# Patient Record
Sex: Female | Born: 1975 | Marital: Single | Smoking: Former smoker
Health system: Southern US, Community
[De-identification: ages and names within clinical notes are randomized; demographics above are authoritative.]

## PROBLEM LIST (undated history)

## (undated) DIAGNOSIS — R51 Headache: Secondary | ICD-10-CM

## (undated) DIAGNOSIS — R519 Headache, unspecified: Secondary | ICD-10-CM

## (undated) DIAGNOSIS — E7800 Pure hypercholesterolemia, unspecified: Secondary | ICD-10-CM

## (undated) DIAGNOSIS — F329 Major depressive disorder, single episode, unspecified: Secondary | ICD-10-CM

## (undated) DIAGNOSIS — F32A Depression, unspecified: Secondary | ICD-10-CM

## (undated) DIAGNOSIS — D649 Anemia, unspecified: Secondary | ICD-10-CM

## (undated) DIAGNOSIS — F419 Anxiety disorder, unspecified: Secondary | ICD-10-CM

## (undated) DIAGNOSIS — Z87440 Personal history of urinary (tract) infections: Secondary | ICD-10-CM

## (undated) DIAGNOSIS — R011 Cardiac murmur, unspecified: Secondary | ICD-10-CM

## (undated) DIAGNOSIS — I10 Essential (primary) hypertension: Secondary | ICD-10-CM

## (undated) DIAGNOSIS — E039 Hypothyroidism, unspecified: Secondary | ICD-10-CM

## (undated) HISTORY — DX: Headache: R51

## (undated) HISTORY — DX: Essential (primary) hypertension: I10

## (undated) HISTORY — DX: Anxiety disorder, unspecified: F41.9

## (undated) HISTORY — DX: Headache, unspecified: R51.9

## (undated) HISTORY — DX: Depression, unspecified: F32.A

## (undated) HISTORY — DX: Pure hypercholesterolemia, unspecified: E78.00

## (undated) HISTORY — DX: Hypothyroidism, unspecified: E03.9

## (undated) HISTORY — PX: COLONOSCOPY: SHX174

## (undated) HISTORY — DX: Cardiac murmur, unspecified: R01.1

## (undated) HISTORY — DX: Major depressive disorder, single episode, unspecified: F32.9

## (undated) HISTORY — PX: WISDOM TOOTH EXTRACTION: SHX21

## (undated) HISTORY — DX: Personal history of urinary (tract) infections: Z87.440

---

## 1997-10-07 HISTORY — PX: TUBAL LIGATION: SHX77

## 2010-10-07 HISTORY — PX: CHOLECYSTECTOMY: SHX55

## 2013-05-03 ENCOUNTER — Encounter: Payer: Self-pay | Admitting: Internal Medicine

## 2013-05-03 ENCOUNTER — Ambulatory Visit (INDEPENDENT_AMBULATORY_CARE_PROVIDER_SITE_OTHER): Payer: Managed Care, Other (non HMO) | Admitting: Internal Medicine

## 2013-05-03 VITALS — BP 130/90 | HR 67 | Temp 99.0°F | Ht 67.75 in | Wt 260.2 lb

## 2013-05-03 DIAGNOSIS — E039 Hypothyroidism, unspecified: Secondary | ICD-10-CM

## 2013-05-03 DIAGNOSIS — I10 Essential (primary) hypertension: Secondary | ICD-10-CM

## 2013-05-03 DIAGNOSIS — R51 Headache: Secondary | ICD-10-CM

## 2013-05-03 DIAGNOSIS — E7800 Pure hypercholesterolemia, unspecified: Secondary | ICD-10-CM

## 2013-05-03 LAB — COMPREHENSIVE METABOLIC PANEL
AST: 26 U/L (ref 0–37)
Albumin: 3.8 g/dL (ref 3.5–5.2)
BUN: 9 mg/dL (ref 6–23)
Calcium: 8.9 mg/dL (ref 8.4–10.5)
Chloride: 110 mEq/L (ref 96–112)
Glucose, Bld: 92 mg/dL (ref 70–99)
Potassium: 4.3 mEq/L (ref 3.5–5.1)
Sodium: 139 mEq/L (ref 135–145)
Total Protein: 7.2 g/dL (ref 6.0–8.3)

## 2013-05-03 LAB — LIPID PANEL
Cholesterol: 216 mg/dL — ABNORMAL HIGH (ref 0–200)
Total CHOL/HDL Ratio: 5
Triglycerides: 121 mg/dL (ref 0.0–149.0)
VLDL: 24.2 mg/dL (ref 0.0–40.0)

## 2013-05-03 LAB — CBC WITH DIFFERENTIAL/PLATELET
Basophils Relative: 0.5 % (ref 0.0–3.0)
Eosinophils Absolute: 0.1 10*3/uL (ref 0.0–0.7)
Eosinophils Relative: 1.1 % (ref 0.0–5.0)
Lymphocytes Relative: 36.3 % (ref 12.0–46.0)
Neutrophils Relative %: 55.7 % (ref 43.0–77.0)
Platelets: 191 10*3/uL (ref 150.0–400.0)
RBC: 4.65 Mil/uL (ref 3.87–5.11)
WBC: 6.5 10*3/uL (ref 4.5–10.5)

## 2013-05-03 MED ORDER — LISINOPRIL-HYDROCHLOROTHIAZIDE 20-25 MG PO TABS: 1.0000 | ORAL_TABLET | Freq: Every day | ORAL | Status: DC

## 2013-05-03 MED ORDER — LEVOTHYROXINE SODIUM 175 MCG PO TABS: 175.0000 ug | ORAL_TABLET | Freq: Every day | ORAL | Status: DC

## 2013-05-03 MED ORDER — METOPROLOL SUCCINATE ER 50 MG PO TB24: 50.0000 mg | ORAL_TABLET | Freq: Every day | ORAL | Status: DC

## 2013-05-05 ENCOUNTER — Encounter: Payer: Self-pay | Admitting: Internal Medicine

## 2013-05-05 DIAGNOSIS — E039 Hypothyroidism, unspecified: Secondary | ICD-10-CM | POA: Insufficient documentation

## 2013-05-05 DIAGNOSIS — E7800 Pure hypercholesterolemia, unspecified: Secondary | ICD-10-CM | POA: Insufficient documentation

## 2013-05-05 DIAGNOSIS — R51 Headache: Secondary | ICD-10-CM | POA: Insufficient documentation

## 2013-05-05 DIAGNOSIS — R519 Headache, unspecified: Secondary | ICD-10-CM | POA: Insufficient documentation

## 2013-05-05 DIAGNOSIS — I10 Essential (primary) hypertension: Secondary | ICD-10-CM | POA: Insufficient documentation

## 2013-05-05 NOTE — Assessment & Plan Note (Signed)
Blood pressure normally controlled per her report.  Off her medication now.  Restart.  Follow.  Check metabolic panel.

## 2013-05-05 NOTE — Progress Notes (Signed)
  Subjective:    Patient ID: [name] Nielsen, female    DOB: 01/25/1976, 37 y.o.   MRN: [id number]  HPI 37 year old female with past history of hypertension, hypercholesterolemia and hypothyroidism who comes in today to follow up on these issues as well as to establish care.  She recently moved to [place] (from [place]) - for a job transfer.  She has been out of her medications for one month.  Feels more fatigue.  Some "puffiness".  She has a history of headaches - 1-2x/month.   Started as a teenager.  May last one day.  No auras.  Takes excedrin.  Relieves.  Able to work.  No allergy or sinus symptoms.  Was diagnosed with hypertension three years ago.  Blood pressure usually averages 115/60s.  On no medication for cholesterol.  LMP - beginning of July.  Intermittently heavy.  No abdominal pain or cramping.  Bowels stable.  Previous history of depression.  Occurred after her mother passed away.  Was on zoloft for a while.  Tapered off and has done well.     Past Medical History  Diagnosis Date  . Depression   . Hypertension   . Hypercholesterolemia   . Frequent headaches     hx of  . Heart murmur   . Hypothyroidism   . Hx: UTI (urinary tract infection)     child, s/p urinary procecure    Review of Systems Patient denies any headache currently.  Intermittent headaches as outlined.  No lightheadedness or dizziness.  No sinus or allergy symptoms.  No chest pain, tightness or palpitations.  No increased shortness of breath, cough or congestion.  No nausea or vomiting.  No acid reflux.  No abdominal pain or cramping.  No bowel change, such as diarrhea, constipation, BRBPR or melana.  No urine change.       Objective:   Physical Exam Filed Vitals:   05/03/13 1008  BP: 130/90  Pulse: 67  Temp: 99 F (37.2 C)   Blood pressure recheck:  132/88, pulse 68  37 year old female in no acute distress.   HEENT:  Nares- clear.  Oropharynx - without lesions. NECK:  Supple.  Nontender.  No audible  bruit.  HEART:  Appears to be regular. LUNGS:  No crackles or wheezing audible.  Respirations even and unlabored.  RADIAL PULSE:  Equal bilaterally.  ABDOMEN:  Soft, nontender.  Bowel sounds present and normal.  No audible abdominal bruit.  EXTREMITIES:  No increased edema present.  DP pulses palpable and equal bilaterally.      Assessment & Plan:  DEPRESSION.  Occurred when her mother passed away.  Off zoloft now.  Follow.   HEALTH MAINTENANCE.  Schedule her for a physical.  Obtain outside records.

## 2013-05-05 NOTE — Assessment & Plan Note (Signed)
Off her thyroid medication.  Restart.  Check tsh.

## 2013-05-05 NOTE — Assessment & Plan Note (Signed)
Low cholesterol diet and exercise.  Check lipid panel.   

## 2013-05-05 NOTE — Assessment & Plan Note (Signed)
Intermittent headaches.  Started as a teenager.  Stable.

## 2013-05-07 ENCOUNTER — Encounter: Payer: Self-pay | Admitting: Internal Medicine

## 2013-05-07 ENCOUNTER — Telehealth: Payer: Self-pay | Admitting: Internal Medicine

## 2013-05-07 DIAGNOSIS — D649 Anemia, unspecified: Secondary | ICD-10-CM

## 2013-05-07 NOTE — Telephone Encounter (Signed)
Pt notified of lab results and the need for f/u lab.  Please schedule her for a f/u non fasting lab appt within the next 2 weeks.  Thanks.

## 2013-05-07 NOTE — Telephone Encounter (Signed)
Appointment 8/15  Sent pt my chart message letting her know about her appointment

## 2013-05-21 ENCOUNTER — Other Ambulatory Visit (INDEPENDENT_AMBULATORY_CARE_PROVIDER_SITE_OTHER): Payer: Managed Care, Other (non HMO)

## 2013-05-21 DIAGNOSIS — D649 Anemia, unspecified: Secondary | ICD-10-CM

## 2013-05-21 LAB — CBC WITH DIFFERENTIAL/PLATELET
Basophils Absolute: 0 10*3/uL (ref 0.0–0.1)
Eosinophils Absolute: 0.1 10*3/uL (ref 0.0–0.7)
Lymphocytes Relative: 35.2 % (ref 12.0–46.0)
MCHC: 33.5 g/dL (ref 30.0–36.0)
Neutrophils Relative %: 55.1 % (ref 43.0–77.0)
RBC: 4.82 Mil/uL (ref 3.87–5.11)
RDW: 14.1 % (ref 11.5–14.6)

## 2013-05-21 LAB — IBC PANEL
Iron: 48 ug/dL (ref 42–145)
Transferrin: 251.7 mg/dL (ref 212.0–360.0)

## 2013-05-22 ENCOUNTER — Encounter: Payer: Self-pay | Admitting: Internal Medicine

## 2013-07-05 ENCOUNTER — Ambulatory Visit (INDEPENDENT_AMBULATORY_CARE_PROVIDER_SITE_OTHER): Payer: Managed Care, Other (non HMO) | Admitting: Internal Medicine

## 2013-07-05 ENCOUNTER — Other Ambulatory Visit (HOSPITAL_COMMUNITY)
Admission: RE | Admit: 2013-07-05 | Discharge: 2013-07-05 | Disposition: A | Discharge status: Home / Self Care | Payer: Managed Care, Other (non HMO) | Source: Ambulatory Visit | Attending: Internal Medicine | Admitting: Internal Medicine

## 2013-07-05 ENCOUNTER — Encounter: Payer: Self-pay | Admitting: Internal Medicine

## 2013-07-05 VITALS — BP 120/80 | HR 69 | Temp 98.4°F | Ht 67.0 in | Wt 253.2 lb

## 2013-07-05 DIAGNOSIS — R51 Headache: Secondary | ICD-10-CM

## 2013-07-05 DIAGNOSIS — I10 Essential (primary) hypertension: Secondary | ICD-10-CM

## 2013-07-05 DIAGNOSIS — E039 Hypothyroidism, unspecified: Secondary | ICD-10-CM

## 2013-07-05 DIAGNOSIS — D649 Anemia, unspecified: Secondary | ICD-10-CM | POA: Insufficient documentation

## 2013-07-05 DIAGNOSIS — Z01419 Encounter for gynecological examination (general) (routine) without abnormal findings: Secondary | ICD-10-CM | POA: Insufficient documentation

## 2013-07-05 DIAGNOSIS — Z124 Encounter for screening for malignant neoplasm of cervix: Secondary | ICD-10-CM

## 2013-07-05 DIAGNOSIS — E7800 Pure hypercholesterolemia, unspecified: Secondary | ICD-10-CM

## 2013-07-05 DIAGNOSIS — Z1151 Encounter for screening for human papillomavirus (HPV): Secondary | ICD-10-CM | POA: Insufficient documentation

## 2013-07-05 LAB — CBC WITH DIFFERENTIAL/PLATELET
Basophils Absolute: 0.1 10*3/uL (ref 0.0–0.1)
Eosinophils Absolute: 0.1 10*3/uL (ref 0.0–0.7)
Lymphocytes Relative: 32.7 % (ref 12.0–46.0)
MCHC: 32.8 g/dL (ref 30.0–36.0)
Neutrophils Relative %: 58.6 % (ref 43.0–77.0)
RDW: 14.7 % — ABNORMAL HIGH (ref 11.5–14.6)

## 2013-07-05 LAB — TSH: TSH: 2.13 u[IU]/mL (ref 0.35–5.50)

## 2013-07-05 NOTE — Assessment & Plan Note (Signed)
Intermittent headaches.  Started as a teenager.  Better.  Follow.   

## 2013-07-05 NOTE — Assessment & Plan Note (Signed)
Blood pressure doing well.  Follow metabolic panel.  

## 2013-07-05 NOTE — Progress Notes (Signed)
  Subjective:    Patient ID: [name] Nielsen, female    DOB: Feb 10, 1976, 37 y.o.   MRN: [id number]  HPI 37 year old female with past history of hypertension, hypercholesterolemia and hypothyroidism who comes in today to follow up on these issues as well as for a complete physical exam.   Headaches better.  No allergy or sinus symptoms.  Was diagnosed with hypertension three years ago.  On medication and controlled.  No abdominal pain or cramping.  Bowels stable.  LMP end of last month.  Has had a tubal ligation.     Past Medical History  Diagnosis Date  . Depression   . Hypertension   . Hypercholesterolemia   . Frequent headaches     hx of  . Heart murmur   . Hypothyroidism   . Hx: UTI (urinary tract infection)     child, s/p urinary procecure    Current Outpatient Prescriptions on File Prior to Visit  Medication Sig Dispense Refill  . levothyroxine (SYNTHROID, LEVOTHROID) 175 MCG tablet Take 1 tablet (175 mcg total) by mouth daily before breakfast.  30 tablet  1  . lisinopril-hydrochlorothiazide (PRINZIDE,ZESTORETIC) 20-25 MG per tablet Take 1 tablet by mouth daily.  30 tablet  1  . metoprolol succinate (TOPROL-XL) 50 MG 24 hr tablet Take 1 tablet (50 mg total) by mouth daily. Take with or immediately following a meal.  30 tablet  1   No current facility-administered medications on file prior to visit.    Review of Systems Headaches have improved.   No lightheadedness or dizziness.  No sinus or allergy symptoms.  No chest pain, tightness or palpitations.  No increased shortness of breath, cough or congestion.  No nausea or vomiting.  No acid reflux.  No abdominal pain or cramping.  No bowel change, such as diarrhea, constipation, BRBPR or melana.  No urine change.       Objective:   Physical Exam  Filed Vitals:   07/05/13 0935  BP: 120/80  Pulse: 69  Temp: 98.4 F (36.9 C)   Blood pressure recheck:  114/76, pulse 38  37 year old female in no acute distress.   HEENT:   Nares- clear.  Oropharynx - without lesions. NECK:  Supple.  Nontender.  No audible bruit.  HEART:  Appears to be regular. LUNGS:  No crackles or wheezing audible.  Respirations even and unlabored.  RADIAL PULSE:  Equal bilaterally.    BREASTS:  No nipple discharge or nipple retraction present.  Could not appreciate any distinct nodules or axillary adenopathy.  ABDOMEN:  Soft, nontender.  Bowel sounds present and normal.  No audible abdominal bruit.  GU:  Normal external genitalia.  Vaginal vault without lesions.  Cervix identified.  Pap performed. Could not appreciate any adnexal masses or tenderness.   RECTAL:  Not performed.   EXTREMITIES:  No increased edema present.  DP pulses palpable and equal bilaterally.          Assessment & Plan:  DEPRESSION.  Occurred when her mother passed away.  Off zoloft now.  Doing well.   HEALTH MAINTENANCE.  Physical today.  Gave her information to schedule her mammogram.  Had colonoscopy a couple of years ago.

## 2013-07-05 NOTE — Assessment & Plan Note (Signed)
Low cholesterol diet and exercise.  Follow lipid panel.   

## 2013-07-05 NOTE — Assessment & Plan Note (Signed)
Not taking iron.  Recheck cbc and ferritin.

## 2013-07-05 NOTE — Assessment & Plan Note (Signed)
On thyroid medication.   Check tsh.

## 2013-07-07 ENCOUNTER — Other Ambulatory Visit: Payer: Self-pay | Admitting: *Deleted

## 2013-07-07 ENCOUNTER — Encounter: Payer: Self-pay | Admitting: Internal Medicine

## 2013-07-07 MED ORDER — LEVOTHYROXINE SODIUM 175 MCG PO TABS: 175.0000 ug | ORAL_TABLET | Freq: Every day | ORAL | Status: DC

## 2013-07-07 MED ORDER — LISINOPRIL-HYDROCHLOROTHIAZIDE 20-25 MG PO TABS: 1.0000 | ORAL_TABLET | Freq: Every day | ORAL | Status: DC

## 2013-07-07 MED ORDER — METOPROLOL SUCCINATE ER 50 MG PO TB24: 50.0000 mg | ORAL_TABLET | Freq: Every day | ORAL | Status: DC

## 2013-07-08 ENCOUNTER — Telehealth: Payer: Self-pay | Admitting: Internal Medicine

## 2013-07-08 ENCOUNTER — Encounter: Payer: Self-pay | Admitting: Internal Medicine

## 2013-07-08 DIAGNOSIS — D649 Anemia, unspecified: Secondary | ICD-10-CM

## 2013-07-08 MED ORDER — INTEGRA 62.5-62.5-40-3 MG PO CAPS: ORAL_CAPSULE | ORAL | Status: DC

## 2013-07-08 NOTE — Telephone Encounter (Signed)
Pt notified of lab results via my chart. Needs a f/u lab in 6 weeks.  Please schedule her for a non fasting lab in 6 weeks and contact her with an appt date and time.

## 2013-07-09 NOTE — Telephone Encounter (Signed)
Sent my chart message letting pt know about non fasting lab appointment  Also ask pt to confirm phone number

## 2013-07-12 ENCOUNTER — Ambulatory Visit: Payer: Self-pay | Admitting: Internal Medicine

## 2013-07-24 ENCOUNTER — Encounter: Payer: Self-pay | Admitting: Internal Medicine

## 2013-08-12 ENCOUNTER — Other Ambulatory Visit: Payer: Self-pay

## 2013-08-13 ENCOUNTER — Encounter: Payer: Self-pay | Admitting: Internal Medicine

## 2013-08-20 ENCOUNTER — Other Ambulatory Visit (INDEPENDENT_AMBULATORY_CARE_PROVIDER_SITE_OTHER): Payer: Managed Care, Other (non HMO)

## 2013-08-20 DIAGNOSIS — D649 Anemia, unspecified: Secondary | ICD-10-CM

## 2013-08-20 LAB — CBC WITH DIFFERENTIAL/PLATELET
Basophils Absolute: 0 10*3/uL (ref 0.0–0.1)
Basophils Relative: 0.4 % (ref 0.0–3.0)
Eosinophils Relative: 1.8 % (ref 0.0–5.0)
HCT: 35.1 % — ABNORMAL LOW (ref 36.0–46.0)
Hemoglobin: 11.7 g/dL — ABNORMAL LOW (ref 12.0–15.0)
Lymphocytes Relative: 41.7 % (ref 12.0–46.0)
Lymphs Abs: 2.6 10*3/uL (ref 0.7–4.0)
MCV: 73.8 fl — ABNORMAL LOW (ref 78.0–100.0)
Monocytes Absolute: 0.4 10*3/uL (ref 0.1–1.0)
Monocytes Relative: 6.2 % (ref 3.0–12.0)
Neutro Abs: 3.1 10*3/uL (ref 1.4–7.7)
RBC: 4.76 Mil/uL (ref 3.87–5.11)
RDW: 14.4 % (ref 11.5–14.6)
WBC: 6.3 10*3/uL (ref 4.5–10.5)

## 2013-08-23 ENCOUNTER — Encounter: Payer: Self-pay | Admitting: Internal Medicine

## 2013-08-23 ENCOUNTER — Telehealth: Payer: Self-pay | Admitting: Internal Medicine

## 2013-08-23 DIAGNOSIS — D649 Anemia, unspecified: Secondary | ICD-10-CM

## 2013-08-23 NOTE — Telephone Encounter (Signed)
Pt notified of lab results via my chart.  Needs a f/u non fasting lab in 3 months.  She also needs a f/u appt with me in 6 months (end of 3/15).  Please schedule and contact pt with appt date and time.  Thanks.

## 2013-08-25 NOTE — Telephone Encounter (Signed)
Lab appointment 2/23 Dr Lorin Picket 5/20 Pt aware of appointment

## 2013-08-25 NOTE — Telephone Encounter (Signed)
Left message for pt to call office

## 2013-09-09 ENCOUNTER — Telehealth: Payer: Self-pay | Admitting: Internal Medicine

## 2013-09-09 ENCOUNTER — Encounter: Payer: Self-pay | Admitting: Internal Medicine

## 2013-09-09 NOTE — Telephone Encounter (Signed)
See below

## 2013-09-09 NOTE — Telephone Encounter (Signed)
Pt had form sent over by mail from [facility] to be signed. I put them in your inbox up front.

## 2013-09-10 ENCOUNTER — Encounter: Payer: Self-pay | Admitting: *Deleted

## 2013-09-10 NOTE — Telephone Encounter (Signed)
I can set her up for monthly appts for weight program.  Will need to schedule an initial appt and then one appt q month for 3 more months.  (will need a total of 4 appts).  appts will need to be appts.  Thanks.  Form placed in your box to hold until appt.

## 2013-09-15 ENCOUNTER — Encounter: Payer: Self-pay | Admitting: *Deleted

## 2013-09-15 NOTE — Telephone Encounter (Signed)
Pt home number can not receive incoming calls- mailed letter

## 2013-09-16 NOTE — Telephone Encounter (Signed)
Dr Lorin Picket please advise date and time

## 2013-09-16 NOTE — Telephone Encounter (Signed)
Can put her in on 09/27/13 - 2:00

## 2013-09-16 NOTE — Telephone Encounter (Signed)
(  Robin-please call patient with a 30 minute appointment) Pt sent mychart message this morning. I received a letter in the mail regarding the below appointment. I tried calling the office, but I cannot get an answer. Please call my work number [phone] or my cell [phone] to set up the appiontment. Or you can just choose a date and time for me. Either way works. The only date this month that I cannot make an appointment is 12/12 and 12/18. Any other date should be fine.

## 2013-09-16 NOTE — Telephone Encounter (Signed)
Sent my chart message about appointment date and time

## 2013-09-27 ENCOUNTER — Other Ambulatory Visit: Payer: Self-pay | Admitting: Internal Medicine

## 2013-09-27 ENCOUNTER — Ambulatory Visit (INDEPENDENT_AMBULATORY_CARE_PROVIDER_SITE_OTHER): Payer: Managed Care, Other (non HMO) | Admitting: Internal Medicine

## 2013-09-27 ENCOUNTER — Encounter: Payer: Self-pay | Admitting: Internal Medicine

## 2013-09-27 VITALS — BP 130/80 | HR 76 | Temp 98.5°F | Ht 67.0 in | Wt 263.8 lb

## 2013-09-27 DIAGNOSIS — E039 Hypothyroidism, unspecified: Secondary | ICD-10-CM

## 2013-09-27 DIAGNOSIS — E7800 Pure hypercholesterolemia, unspecified: Secondary | ICD-10-CM

## 2013-09-27 DIAGNOSIS — D649 Anemia, unspecified: Secondary | ICD-10-CM

## 2013-09-27 DIAGNOSIS — I10 Essential (primary) hypertension: Secondary | ICD-10-CM

## 2013-09-27 NOTE — Progress Notes (Signed)
Orders placed for labs

## 2013-09-27 NOTE — Progress Notes (Signed)
  Subjective:    Patient ID: [name] Nielsen, female    DOB: [birth year], 37 y.o.   MRN: [id number]  HPI 37 year old female with past history of hypertension, hypercholesterolemia and hypothyroidism who comes in today to discuss plans for gastric sleeve surgery.  She states she has had problems with her weight for years.  Has tried weight watchers and ephedrine.  Has tried exercising/running.  Has not been exercising regularly recently.  Not watching her diet as well.  No acid reflux.  No nausea or vomiting.  No bowel change.     Past Medical History  Diagnosis Date  . Depression   . Hypertension   . Hypercholesterolemia   . Frequent headaches     hx of  . Heart murmur   . Hypothyroidism   . Hx: UTI (urinary tract infection)     child, s/p urinary procecure    Current Outpatient Prescriptions on File Prior to Visit  Medication Sig Dispense Refill  . Fe Fum-FePoly-Vit C-Vit B3 (INTEGRA) 62.5-62.5-40-3 MG CAPS Take one capsule 3 days per week.  30 capsule  2  . levothyroxine (SYNTHROID, LEVOTHROID) 175 MCG tablet Take 1 tablet (175 mcg total) by mouth daily before breakfast.  30 tablet  11  . lisinopril-hydrochlorothiazide (PRINZIDE,ZESTORETIC) 20-25 MG per tablet Take 1 tablet by mouth daily.  30 tablet  5  . metoprolol succinate (TOPROL-XL) 50 MG 24 hr tablet Take 1 tablet (50 mg total) by mouth daily. Take with or immediately following a meal.  30 tablet  5   No current facility-administered medications on file prior to visit.    Review of Systems Headaches have improved.   No lightheadedness or dizziness.  No sinus or allergy symptoms.  No chest pain, tightness or palpitations.  No increased shortness of breath, cough or congestion.  No nausea or vomiting.  No acid reflux.  No abdominal pain or cramping.  No bowel change, such as diarrhea, constipation, BRBPR or melana.  No urine change.  Has tried various methods to lose weight.  Will lose some and then gain weight back.  Has not been  exercising regularly lately.  Plans to start.       Objective:   Physical Exam  Filed Vitals:   09/27/13 1400  BP: 130/80  Pulse: 76  Temp: 98.5 F (25.73 C)   37 year old female in no acute distress.   HEENT:  Nares- clear.  Oropharynx - without lesions. NECK:  Supple.  Nontender.  No audible bruit.  HEART:  Appears to be regular. LUNGS:  No crackles or wheezing audible.  Respirations even and unlabored.  RADIAL PULSE:  Equal bilaterally.   ABDOMEN:  Soft, nontender.  Bowel sounds present and normal.  No audible abdominal bruit.   EXTREMITIES:  No increased edema present.  DP pulses palpable and equal bilaterally.          Assessment & Plan:  DESIRE FOR WEIGHT LOSS.  Planning for gastric sleeve.  Has a history of hypertension and hypercholesterolemia.  She has tried various weight loss methods.  Has tried ephedrine and weight watchers.  Not exercising regularly.  Plans to start.  Planning to give up sodas and drink more water.  Plans to start walking.  Will monitor her diet better.  Recheck weight in one month.  She is planning for gastric sleeve surgery.    HEALTH MAINTENANCE.  Physical 07/05/13.   Mammogram 07/22/13 negative.   Had colonoscopy a couple of years ago.

## 2013-09-27 NOTE — Progress Notes (Signed)
Pre-visit discussion using our clinic review tool. No additional management support is needed unless otherwise documented below in the visit note.  

## 2013-10-01 ENCOUNTER — Encounter: Payer: Self-pay | Admitting: Internal Medicine

## 2013-10-01 NOTE — Assessment & Plan Note (Signed)
Low cholesterol diet and exercise.  Follow lipid panel.   

## 2013-10-01 NOTE — Assessment & Plan Note (Signed)
Blood pressure doing well.  Follow metabolic panel.  

## 2013-10-01 NOTE — Assessment & Plan Note (Signed)
On thyroid medication.  Follow tsh.  

## 2013-10-06 ENCOUNTER — Other Ambulatory Visit (INDEPENDENT_AMBULATORY_CARE_PROVIDER_SITE_OTHER): Payer: Self-pay | Admitting: Surgery

## 2013-10-06 ENCOUNTER — Ambulatory Visit (INDEPENDENT_AMBULATORY_CARE_PROVIDER_SITE_OTHER): Payer: Commercial Indemnity | Admitting: Surgery

## 2013-10-06 ENCOUNTER — Encounter (INDEPENDENT_AMBULATORY_CARE_PROVIDER_SITE_OTHER): Payer: Self-pay | Admitting: Surgery

## 2013-10-06 DIAGNOSIS — Z6831 Body mass index (BMI) 31.0-31.9, adult: Secondary | ICD-10-CM | POA: Insufficient documentation

## 2013-10-06 DIAGNOSIS — D649 Anemia, unspecified: Secondary | ICD-10-CM

## 2013-10-06 DIAGNOSIS — I10 Essential (primary) hypertension: Secondary | ICD-10-CM

## 2013-10-06 DIAGNOSIS — Z6833 Body mass index (BMI) 33.0-33.9, adult: Secondary | ICD-10-CM | POA: Insufficient documentation

## 2013-10-06 DIAGNOSIS — Z6841 Body Mass Index (BMI) 40.0 and over, adult: Secondary | ICD-10-CM

## 2013-10-06 LAB — COMPREHENSIVE METABOLIC PANEL
ALT: 27 U/L (ref 0–35)
AST: 21 U/L (ref 0–37)
Albumin: 4.2 g/dL (ref 3.5–5.2)
Alkaline Phosphatase: 44 U/L (ref 39–117)
BUN: 12 mg/dL (ref 6–23)
Chloride: 99 mEq/L (ref 96–112)
Potassium: 3.4 mEq/L — ABNORMAL LOW (ref 3.5–5.3)
Total Bilirubin: 0.3 mg/dL (ref 0.3–1.2)

## 2013-10-06 LAB — CBC WITH DIFFERENTIAL/PLATELET
Basophils Absolute: 0 10*3/uL (ref 0.0–0.1)
Basophils Relative: 0 % (ref 0–1)
Eosinophils Absolute: 0.1 10*3/uL (ref 0.0–0.7)
HCT: 36.3 % (ref 36.0–46.0)
Lymphocytes Relative: 35 % (ref 12–46)
MCHC: 33.1 g/dL (ref 30.0–36.0)
MCV: 73.6 fL — ABNORMAL LOW (ref 78.0–100.0)
Monocytes Absolute: 0.3 10*3/uL (ref 0.1–1.0)
Neutro Abs: 3.8 10*3/uL (ref 1.7–7.7)
Neutrophils Relative %: 59 % (ref 43–77)
Platelets: 270 10*3/uL (ref 150–400)
RDW: 15.2 % (ref 11.5–15.5)
WBC: 6.6 10*3/uL (ref 4.0–10.5)

## 2013-10-06 NOTE — Progress Notes (Signed)
Chief Complaint:  Morbid obesity BMI 40  History of Present Illness:  [name] Nielsen is an 37 y.o. female who has been to our seminar and who is interested in a sleeve gastrectomy. I went over the procedure in some detail including complications not listed limited to leaks and injury to the spleen. She is excited to go ahead and begin a workup. With her is a list of her attempts at weight loss including Adipex Weight Watchers and low calorie diets with some weight loss but with weight regain. She currently takes medications for hypertension and hypothyroidism. She is followed by Dr. Parks Ranger.    Past Medical History  Diagnosis Date  . Depression   . Hypertension   . Hypercholesterolemia   . Frequent headaches     hx of  . Heart murmur   . Hypothyroidism   . Hx: UTI (urinary tract infection)     child, s/p urinary procecure    Past Surgical History  Procedure Laterality Date  . Cholecystectomy  2012  . Tubal ligation  1999    Current Outpatient Prescriptions  Medication Sig Dispense Refill  . cholecalciferol (VITAMIN D) 1000 UNITS tablet Take 1,000 Units by mouth daily.      . Fe Fum-FePoly-Vit C-Vit B3 (INTEGRA) 62.5-62.5-40-3 MG CAPS Take one capsule 3 days per week.  30 capsule  2  . levothyroxine (SYNTHROID, LEVOTHROID) 175 MCG tablet Take 1 tablet (175 mcg total) by mouth daily before breakfast.  30 tablet  11  . lisinopril-hydrochlorothiazide (PRINZIDE,ZESTORETIC) 20-25 MG per tablet Take 1 tablet by mouth daily.  30 tablet  5  . metoprolol succinate (TOPROL-XL) 50 MG 24 hr tablet Take 1 tablet (50 mg total) by mouth daily. Take with or immediately following a meal.  30 tablet  5   No current facility-administered medications for this visit.   Review of patient's allergies indicates no known allergies. Family History  Problem Relation Age of Onset  . Cancer Mother     brain  . Diabetes Father   . Hypertension Father   . Kidney disease Father   . Stroke Father   .  Cancer Father     prostate   Social History:   reports that she has never smoked. She has never used smokeless tobacco. She reports that she does not drink alcohol or use illicit drugs.   REVIEW OF SYSTEMS - PERTINENT POSITIVES ONLY: No history of DVT.  Physical Exam:   Blood pressure 138/82, pulse 68, temperature 98.1 F (36.7 C), temperature source Temporal, resp. rate 16, height 5\' 7"  (1.702 m), weight 257 lb 12.8 oz (116.937 kg). Body mass index is 40.37 kg/(m^2).  Gen:  WDWN white female NAD  Neurological: Alert and oriented to person, place, and time. Motor and sensory function is grossly intact  Head: Normocephalic and atraumatic.  Eyes: Conjunctivae are normal. Pupils are equal, round, and reactive to light. No scleral icterus.  Neck: Normal range of motion. Neck supple. No tracheal deviation or thyromegaly present.  Cardiovascular:  SR without murmurs or gallops.  No carotid bruits Respiratory: Effort normal.  No respiratory distress. No chest wall tenderness. Breath sounds normal.  No wheezes, rales or rhonchi.  Abdomen:  Nontender. Obese. GU: Musculoskeletal: Normal range of motion. Extremities are nontender. No cyanosis, edema or clubbing noted Lymphadenopathy: No cervical, preauricular, postauricular or axillary adenopathy is present Skin: Skin is warm and dry. No rash noted. No diaphoresis. No erythema. No pallor. Pscyh: Normal mood and affect. Behavior is normal.  Judgment and thought content normal.   LABORATORY RESULTS: No results found for this or any previous visit (from the past 48 hour(s)).  RADIOLOGY RESULTS: No results found.  Problem List: Patient Active Problem List   Diagnosis Date Noted  . Anemia 07/05/2013  . Headache(784.0) 05/05/2013  . Essential hypertension, benign 05/05/2013  . Hypercholesterolemia 05/05/2013  . Hypothyroidism 05/05/2013    Assessment & Plan: Morbid obesity and felt to be a good candidate for a sleeve gastrectomy. We'll  proceed with workup.    Matt B. Daphine Deutscher, MD, [facility] [phone] beeper [phone]  10/06/2013 11:57 AM

## 2013-10-06 NOTE — Patient Instructions (Signed)
Sleeve Gastrectomy A sleeve gastrectomy is a surgery in which a large portion of the stomach is removed. After the surgery, the stomach will be a narrow tube about the size of a banana. This surgery is performed to help a person lose weight. The person loses weight because the reduced size of the stomach restricts the amount of food that the person can eat. The stomach will hold much less food than before the surgery. Also, the part of the stomach that is removed produces a hormone that causes hunger.  This surgery is done for people who have morbid obesity, defined as a body mass index (BMI) greater than 40. BMI is an estimate of body fat and is calculated from the height and weight of a person. This surgery may also be done for people with a BMI between 35 and 40 if they have other diseases, such as type 2 diabetes mellitus, obstructive sleep apnea, or heart and lung disorders (cardiopulmonary diseases).  LET YOUR HEALTH CARE PROVIDER KNOW ABOUT:  Any allergies you have.   All medicines you are taking, including vitamins, herbs, eyedrops, creams, and over-the-counter medicines.   Use of steroids (by mouth or creams).   Previous problems you or members of your family have had with the use of anesthetics.   Any blood disorders you have.   Previous surgeries you have had.   Possibility of pregnancy, if this applies.   Other health problems you have. RISKS AND COMPLICATIONS Generally, sleeve gastrectomy is a safe procedure. However, as with any procedure, complications can occur. Possible complications include:  Infection.  Bleeding.  Blood clots.  Damage to other organs or tissue.  Leakage of fluid from the stomach into the abdominal cavity (rare). BEFORE THE PROCEDURE  You may need to have blood tests and imaging tests (such as X-rays or ultrasonography) done before the day of surgery. A test to evaluate your esophagus and how it moves (esophageal manometry) may also be  done.  You may be placed on a liquid diet 2 3 weeks before the surgery.  Ask your health care provider about changing or stopping your regular medicines.  Do not eat or drink anything for at least 8 hours before the procedure.   Make plans to have someone drive you home after your hospital stay. Also arrange for someone to help you with activities during recovery. PROCEDURE  A laparoscopic technique is usually used for this surgery:  You will be given medicine to make you sleep through the procedure (general anesthetic). This medicine will be given through an intravenous (IV) access tube that is put into one of your veins.  Once you are asleep, your abdomen will be cleaned and sterilized.  Several small incisions will be made in your abdomen.  Your abdomen will be filled with air so that it expands. This gives the surgeon more room to operate and makes your organs easier to see.  A thin, lighted tube with a tiny camera on the end (laparoscope) is put through a small incision in your abdomen. The camera on the laparoscope sends a picture to a TV screen in the operating room. This gives the surgeon a good view inside the abdomen.  Hollow tubes are put through the other small incisions in your abdomen. The tools needed for the procedure are put through these tubes.  The surgeon uses staples to divide part of the stomach and then removes it through one of the incisions.  The remaining stomach may be reinforced using   stitches or surgical glue or both to prevent leakage of the stomach contents. A small tube (drain) may be placed through one of the incisions to allow extra fluid to flow from the area.  The incisions are closed with stitches, staples, or glue. AFTER THE PROCEDURE  You will be monitored closely in a recovery area. Once the anesthetic has worn off, you will likely be moved to a regular hospital room.  You will be given medicine for pain and nausea.   You may have a drain  from one of the incisions in your abdomen. If a drain is used, it may stay in place after you go home from the hospital and be removed at a follow-up appointment.   You will be encouraged to walk around several times a day. This helps prevent blood clots.  You will be started on a liquid diet the first day after your surgery. Sometimes a test is done to check for leaking before you can eat.  You will be urged to cough and do deep breathing exercises. This helps prevent a lung infection after a surgery.  You will likely need to stay in the hospital for a few days.  Document Released: 07/21/2009 Document Revised: 05/26/2013 Document Reviewed: 02/05/2013 [employer] Patient Information 2014 [employer].  

## 2013-10-28 ENCOUNTER — Other Ambulatory Visit (HOSPITAL_COMMUNITY): Payer: Managed Care, Other (non HMO)

## 2013-10-28 ENCOUNTER — Ambulatory Visit (HOSPITAL_COMMUNITY): Payer: Managed Care, Other (non HMO)

## 2013-11-02 ENCOUNTER — Ambulatory Visit: Payer: Managed Care, Other (non HMO) | Admitting: Internal Medicine

## 2013-11-02 ENCOUNTER — Ambulatory Visit (HOSPITAL_COMMUNITY)
Admission: RE | Admit: 2013-11-02 | Discharge: 2013-11-02 | Disposition: A | Discharge status: Home / Self Care | Payer: Managed Care, Other (non HMO) | Source: Ambulatory Visit | Attending: Surgery | Admitting: Surgery

## 2013-11-02 ENCOUNTER — Ambulatory Visit (HOSPITAL_COMMUNITY): Payer: Managed Care, Other (non HMO)

## 2013-11-02 ENCOUNTER — Other Ambulatory Visit (HOSPITAL_COMMUNITY): Payer: Managed Care, Other (non HMO)

## 2013-11-02 ENCOUNTER — Other Ambulatory Visit: Payer: Self-pay

## 2013-11-02 DIAGNOSIS — Z6841 Body Mass Index (BMI) 40.0 and over, adult: Secondary | ICD-10-CM | POA: Insufficient documentation

## 2013-11-02 DIAGNOSIS — I10 Essential (primary) hypertension: Secondary | ICD-10-CM | POA: Insufficient documentation

## 2013-11-02 DIAGNOSIS — E7800 Pure hypercholesterolemia, unspecified: Secondary | ICD-10-CM | POA: Insufficient documentation

## 2013-11-02 DIAGNOSIS — F329 Major depressive disorder, single episode, unspecified: Secondary | ICD-10-CM | POA: Insufficient documentation

## 2013-11-02 DIAGNOSIS — E039 Hypothyroidism, unspecified: Secondary | ICD-10-CM | POA: Insufficient documentation

## 2013-11-02 DIAGNOSIS — F3289 Other specified depressive episodes: Secondary | ICD-10-CM | POA: Insufficient documentation

## 2013-11-02 DIAGNOSIS — D649 Anemia, unspecified: Secondary | ICD-10-CM | POA: Insufficient documentation

## 2013-11-02 DIAGNOSIS — Z9089 Acquired absence of other organs: Secondary | ICD-10-CM | POA: Insufficient documentation

## 2013-11-09 ENCOUNTER — Ambulatory Visit (INDEPENDENT_AMBULATORY_CARE_PROVIDER_SITE_OTHER): Payer: Managed Care, Other (non HMO) | Admitting: Internal Medicine

## 2013-11-09 ENCOUNTER — Encounter: Payer: Self-pay | Admitting: Internal Medicine

## 2013-11-09 VITALS — BP 118/80 | HR 75 | Temp 98.2°F | Ht 67.0 in | Wt 263.0 lb

## 2013-11-09 DIAGNOSIS — I10 Essential (primary) hypertension: Secondary | ICD-10-CM

## 2013-11-09 DIAGNOSIS — E039 Hypothyroidism, unspecified: Secondary | ICD-10-CM

## 2013-11-09 DIAGNOSIS — E7800 Pure hypercholesterolemia, unspecified: Secondary | ICD-10-CM

## 2013-11-09 DIAGNOSIS — D649 Anemia, unspecified: Secondary | ICD-10-CM

## 2013-11-09 DIAGNOSIS — R51 Headache: Secondary | ICD-10-CM

## 2013-11-09 NOTE — Progress Notes (Signed)
Subjective:    Patient ID: [name] Nielsen, female    DOB: 03-14-1976, 38 y.o.   MRN: [id number]  HPI 38 year old female with past history of hypertension, hypercholesterolemia and hypothyroidism who comes in today for a f/u appt to discuss plans for gastric sleeve surgery.  She states she has had problems with her weight for years.  See last note for details.  Since her last visit, she has decreased her fast food intake.  Is cooking at home more.  Taking her lunch instead of going out.  Eating more protein.  Has started exercising.  Has decreased her carb intake.  Eating more vegetables.  Drinking water.  Due to see the dietician this weekend.  Overall she feels she is doing well.  Blood pressure averaging 118-122/84.  Had barium swallow, EKG and CXR.  Planning to see psychiatry.      Past Medical History  Diagnosis Date  . Depression   . Hypertension   . Hypercholesterolemia   . Frequent headaches     hx of  . Heart murmur   . Hypothyroidism   . Hx: UTI (urinary tract infection)     child, s/p urinary procecure    Current Outpatient Prescriptions on File Prior to Visit  Medication Sig Dispense Refill  . cholecalciferol (VITAMIN D) 1000 UNITS tablet Take 1,000 Units by mouth daily.      . Fe Fum-FePoly-Vit C-Vit B3 (INTEGRA) 62.5-62.5-40-3 MG CAPS Take one capsule 3 days per week.  30 capsule  2  . levothyroxine (SYNTHROID, LEVOTHROID) 175 MCG tablet Take 1 tablet (175 mcg total) by mouth daily before breakfast.  30 tablet  11  . lisinopril-hydrochlorothiazide (PRINZIDE,ZESTORETIC) 20-25 MG per tablet Take 1 tablet by mouth daily.  30 tablet  5  . metoprolol succinate (TOPROL-XL) 50 MG 24 hr tablet Take 1 tablet (50 mg total) by mouth daily. Take with or immediately following a meal.  30 tablet  5   No current facility-administered medications on file prior to visit.    Review of Systems Headaches have improved.   No lightheadedness or dizziness.  No sinus or allergy symptoms.  No  chest pain, tightness or palpitations.  No increased shortness of breath, cough or congestion.  No nausea or vomiting.  No acid reflux.  No abdominal pain or cramping.  No bowel change, such as diarrhea, constipation, BRBPR or melana.  No urine change.  Has tried various methods to lose weight. Since her last visit, has made some adjustments in her diet, food intake and exercise.       Objective:   Physical Exam  Filed Vitals:   11/09/13 0925  BP: 118/80  Pulse: 75  Temp: 98.2 F (6636.708 C)   38 year old female in no acute distress.   HEENT:  Nares- clear.  Oropharynx - without lesions. NECK:  Supple.  Nontender.  No audible bruit.  HEART:  Appears to be regular. LUNGS:  No crackles or wheezing audible.  Respirations even and unlabored.  RADIAL PULSE:  Equal bilaterally.   ABDOMEN:  Soft, nontender.  Bowel sounds present and normal.  No audible abdominal bruit.   EXTREMITIES:  No increased edema present.  DP pulses palpable and equal bilaterally.          Assessment & Plan:  DESIRE FOR WEIGHT LOSS.  Planning for gastric sleeve.  Has a history of hypertension and hypercholesterolemia.  She has tried various weight loss methods.  Has tried ephedrine and weight watchers.  Has  just started back exercising.  Has adjusted her diet and food intake as outlined.  Has decreased carb intake.  Increased protein intake and water intake.  Follow.  Recheck in one month.  Discussed increasing her exercise.     HEALTH MAINTENANCE.  Physical 07/05/13.   Mammogram 07/22/13 negative.   Had colonoscopy a couple of years ago.

## 2013-11-09 NOTE — Progress Notes (Signed)
Pre-visit discussion using our clinic review tool. No additional management support is needed unless otherwise documented below in the visit note.  

## 2013-11-11 ENCOUNTER — Other Ambulatory Visit (INDEPENDENT_AMBULATORY_CARE_PROVIDER_SITE_OTHER): Payer: Managed Care, Other (non HMO)

## 2013-11-11 DIAGNOSIS — I10 Essential (primary) hypertension: Secondary | ICD-10-CM

## 2013-11-11 DIAGNOSIS — E039 Hypothyroidism, unspecified: Secondary | ICD-10-CM

## 2013-11-11 DIAGNOSIS — D649 Anemia, unspecified: Secondary | ICD-10-CM

## 2013-11-11 DIAGNOSIS — E7800 Pure hypercholesterolemia, unspecified: Secondary | ICD-10-CM

## 2013-11-11 LAB — LDL CHOLESTEROL, DIRECT: Direct LDL: 178.3 mg/dL

## 2013-11-11 LAB — LIPID PANEL
CHOL/HDL RATIO: 4
CHOLESTEROL: 248 mg/dL — AB (ref 0–200)
HDL: 55.2 mg/dL (ref >39.00)
TRIGLYCERIDES: 154 mg/dL — AB (ref 0.0–149.0)
VLDL: 30.8 mg/dL (ref 0.0–40.0)

## 2013-11-11 LAB — COMPREHENSIVE METABOLIC PANEL
ALK PHOS: 41 U/L (ref 39–117)
ALT: 38 U/L — AB (ref 0–35)
AST: 25 U/L (ref 0–37)
Albumin: 4 g/dL (ref 3.5–5.2)
BILIRUBIN TOTAL: 0.4 mg/dL (ref 0.3–1.2)
BUN: 14 mg/dL (ref 6–23)
CALCIUM: 9.3 mg/dL (ref 8.4–10.5)
CO2: 26 mEq/L (ref 19–32)
CREATININE: 0.9 mg/dL (ref 0.4–1.2)
Chloride: 103 mEq/L (ref 96–112)
GFR: 70.95 mL/min (ref >60.00)
Glucose, Bld: 85 mg/dL (ref 70–99)
Potassium: 3.8 mEq/L (ref 3.5–5.1)
Sodium: 136 mEq/L (ref 135–145)
Total Protein: 7.5 g/dL (ref 6.0–8.3)

## 2013-11-11 LAB — FERRITIN: Ferritin: 14.2 ng/mL (ref 10.0–291.0)

## 2013-11-11 LAB — TSH: TSH: 2.76 u[IU]/mL (ref 0.35–5.50)

## 2013-11-13 ENCOUNTER — Encounter: Payer: Managed Care, Other (non HMO) | Attending: Surgery | Admitting: Dietician

## 2013-11-13 ENCOUNTER — Encounter: Payer: Self-pay | Admitting: Dietician

## 2013-11-13 ENCOUNTER — Telehealth: Payer: Self-pay | Admitting: Internal Medicine

## 2013-11-13 ENCOUNTER — Encounter: Payer: Self-pay | Admitting: Internal Medicine

## 2013-11-13 DIAGNOSIS — Z01818 Encounter for other preprocedural examination: Secondary | ICD-10-CM | POA: Insufficient documentation

## 2013-11-13 DIAGNOSIS — R7989 Other specified abnormal findings of blood chemistry: Secondary | ICD-10-CM

## 2013-11-13 DIAGNOSIS — R945 Abnormal results of liver function studies: Secondary | ICD-10-CM

## 2013-11-13 DIAGNOSIS — Z713 Dietary counseling and surveillance: Secondary | ICD-10-CM | POA: Insufficient documentation

## 2013-11-13 DIAGNOSIS — E669 Obesity, unspecified: Secondary | ICD-10-CM | POA: Insufficient documentation

## 2013-11-13 NOTE — Telephone Encounter (Signed)
Pt notified of lab results via my chart.  Needs a non fasting lab in 2 weeks.  Please schedule and contact her with a lab appt date and time.    Thanks.

## 2013-11-13 NOTE — Patient Instructions (Signed)
Call [facility] to schedule Pre-Op Class when surgery is scheduled.  

## 2013-11-13 NOTE — Progress Notes (Signed)
  Pre-Op Assessment Visit:  Pre-Operative Sleeve Gastrectomy Surgery  Medical Nutrition Therapy:  Appt start time: 1030   End time:  1100.  Patient was seen on 11/13/2013 for Pre-Operative Sleeve Gastrectomy Nutrition Assessment. Assessment and letter of approval faxed to [facility] coordinator on 11/13/2013.   Handouts given during visit include:  Pre-Op Goals Bariatric Surgery Protein Shakes  Patient to call the [facility] to enroll in Pre-Op and Post-Op Nutrition Education when surgery date is scheduled.

## 2013-11-14 ENCOUNTER — Encounter: Payer: Self-pay | Admitting: Internal Medicine

## 2013-11-14 NOTE — Assessment & Plan Note (Signed)
Blood pressure doing well.  Follow metabolic panel.  

## 2013-11-14 NOTE — Assessment & Plan Note (Signed)
Intermittent headaches.  Started as a teenager.  Better.  Follow.

## 2013-11-14 NOTE — Assessment & Plan Note (Signed)
Not taking iron.  Follow cbc and ferritin.

## 2013-11-14 NOTE — Assessment & Plan Note (Signed)
On thyroid medication.  Follow tsh.  

## 2013-11-14 NOTE — Assessment & Plan Note (Signed)
Low cholesterol diet and exercise.  Follow lipid panel.   

## 2013-11-16 NOTE — Telephone Encounter (Signed)
Yes, can keep that appt.

## 2013-11-16 NOTE — Telephone Encounter (Signed)
Patient already has labs scheduled for 2/23 is that ok

## 2013-11-22 ENCOUNTER — Ambulatory Visit: Payer: Managed Care, Other (non HMO) | Admitting: Dietician

## 2013-11-29 ENCOUNTER — Other Ambulatory Visit: Payer: Managed Care, Other (non HMO)

## 2013-12-03 ENCOUNTER — Encounter: Payer: Self-pay | Admitting: Internal Medicine

## 2013-12-05 ENCOUNTER — Encounter: Payer: Self-pay | Admitting: Internal Medicine

## 2013-12-21 ENCOUNTER — Ambulatory Visit (INDEPENDENT_AMBULATORY_CARE_PROVIDER_SITE_OTHER): Payer: Managed Care, Other (non HMO) | Admitting: Internal Medicine

## 2013-12-21 ENCOUNTER — Encounter: Payer: Self-pay | Admitting: Internal Medicine

## 2013-12-21 VITALS — BP 110/70 | HR 69 | Temp 98.4°F | Ht 67.0 in | Wt 260.8 lb

## 2013-12-21 DIAGNOSIS — I10 Essential (primary) hypertension: Secondary | ICD-10-CM

## 2013-12-21 DIAGNOSIS — E039 Hypothyroidism, unspecified: Secondary | ICD-10-CM

## 2013-12-21 DIAGNOSIS — E7800 Pure hypercholesterolemia, unspecified: Secondary | ICD-10-CM

## 2013-12-21 DIAGNOSIS — R51 Headache: Secondary | ICD-10-CM

## 2013-12-21 MED ORDER — ALPRAZOLAM 0.25 MG PO TABS: 0.2500 mg | ORAL_TABLET | Freq: Every day | ORAL | Status: DC | PRN

## 2013-12-21 NOTE — Assessment & Plan Note (Signed)
Intermittent headaches.  Started as a teenager.  Better.  Follow.

## 2013-12-21 NOTE — Assessment & Plan Note (Addendum)
Blood pressure doing well.  Follow metabolic panel.  

## 2013-12-21 NOTE — Progress Notes (Signed)
Pre-visit discussion using our clinic review tool. No additional management support is needed unless otherwise documented below in the visit note.  

## 2013-12-21 NOTE — Progress Notes (Signed)
Subjective:    Patient ID: [name] Nielsen, female    DOB: Sep 10, 1976, 38 y.o.   MRN: [id number]  HPI 38 year old female with past history of hypertension, hypercholesterolemia and hypothyroidism who comes in today for a f/u appt to discuss plans for gastric sleeve surgery.  She states she has had problems with her weight for years.  See previous note for details.  Since her last visit, she has joined the [employer].  Exercising on the elipitical machine and walking on the treadmill. Also plans to start aerobic classes.  Plans to increase her days of the week she goes.   Is trying to eat less carbs.  Eating more protein.  Drinking more water.  Saw a nutritionist.  Overall she feels she is doing well.  Blood pressure is doing well.  Had barium swallow, EKG and CXR.  Planning to see psychiatry.      Past Medical History  Diagnosis Date  . Depression   . Hypertension   . Hypercholesterolemia   . Frequent headaches     hx of  . Heart murmur   . Hypothyroidism   . Hx: UTI (urinary tract infection)     child, s/p urinary procecure    Current Outpatient Prescriptions on File Prior to Visit  Medication Sig Dispense Refill  . cholecalciferol (VITAMIN D) 1000 UNITS tablet Take 1,000 Units by mouth daily.      . Fe Fum-FePoly-Vit C-Vit B3 (INTEGRA) 62.5-62.5-40-3 MG CAPS Take one capsule 3 days per week.  30 capsule  2  . levothyroxine (SYNTHROID, LEVOTHROID) 175 MCG tablet Take 1 tablet (175 mcg total) by mouth daily before breakfast.  30 tablet  11  . lisinopril-hydrochlorothiazide (PRINZIDE,ZESTORETIC) 20-25 MG per tablet Take 1 tablet by mouth daily.  30 tablet  5  . metoprolol succinate (TOPROL-XL) 50 MG 24 hr tablet Take 1 tablet (50 mg total) by mouth daily. Take with or immediately following a meal.  30 tablet  5   No current facility-administered medications on file prior to visit.    Review of Systems Headaches have improved.   No lightheadedness or dizziness.  No sinus or allergy symptoms.   No chest pain, tightness or palpitations.  No increased shortness of breath, cough or congestion.  No nausea or vomiting.  No acid reflux.  No abdominal pain or cramping.  No bowel change, such as diarrhea, constipation, BRBPR or melana.  No urine change.  Has tried various methods to lose weight. Since her last visit, has made some adjustments in her diet, food intake and exercise as outlined.       Objective:   Physical Exam  Filed Vitals:   12/21/13 0908  BP: 110/70  Pulse: 69  Temp: 98.4 F (5636.749 C)   38 year old female in no acute distress.   HEENT:  Nares- clear.  Oropharynx - without lesions. NECK:  Supple.  Nontender.  No audible bruit.  HEART:  Appears to be regular. LUNGS:  No crackles or wheezing audible.  Respirations even and unlabored.  RADIAL PULSE:  Equal bilaterally.   ABDOMEN:  Soft, nontender.  Bowel sounds present and normal.  No audible abdominal bruit.   EXTREMITIES:  No increased edema present.  DP pulses palpable and equal bilaterally.          Assessment & Plan:  DESIRE FOR WEIGHT LOSS.  Planning for gastric sleeve.  Has a history of hypertension and hypercholesterolemia.  She has tried various weight loss methods.  Has tried  ephedrine and weight watchers.  Has just started back exercising.  Has adjusted her diet and food intake as outlined.  Has decreased carb intake.  Increased protein intake and water intake.  Follow.  Recheck in one month.  Discussed increasing her exercise.     HEALTH MAINTENANCE.  Physical 07/05/13.   Mammogram 07/22/13 negative.   Had colonoscopy a couple of years ago.

## 2013-12-25 ENCOUNTER — Encounter: Payer: Self-pay | Admitting: Internal Medicine

## 2013-12-25 NOTE — Assessment & Plan Note (Signed)
Low cholesterol diet and exercise.  Follow lipid panel.   

## 2013-12-25 NOTE — Assessment & Plan Note (Signed)
On thyroid medication.  Follow tsh.  

## 2014-02-02 ENCOUNTER — Encounter: Payer: Self-pay | Admitting: Internal Medicine

## 2014-02-02 ENCOUNTER — Ambulatory Visit (INDEPENDENT_AMBULATORY_CARE_PROVIDER_SITE_OTHER): Payer: Managed Care, Other (non HMO) | Admitting: Internal Medicine

## 2014-02-02 VITALS — BP 120/80 | HR 76 | Temp 98.4°F | Ht 67.0 in | Wt 262.0 lb

## 2014-02-02 DIAGNOSIS — R51 Headache: Secondary | ICD-10-CM

## 2014-02-02 DIAGNOSIS — R945 Abnormal results of liver function studies: Secondary | ICD-10-CM

## 2014-02-02 DIAGNOSIS — R7989 Other specified abnormal findings of blood chemistry: Secondary | ICD-10-CM

## 2014-02-02 DIAGNOSIS — I10 Essential (primary) hypertension: Secondary | ICD-10-CM

## 2014-02-02 LAB — HEPATIC FUNCTION PANEL
ALBUMIN: 4.2 g/dL (ref 3.5–5.2)
ALT: 30 U/L (ref 0–35)
AST: 22 U/L (ref 0–37)
Alkaline Phosphatase: 41 U/L (ref 39–117)
Bilirubin, Direct: 0 mg/dL (ref 0.0–0.3)
Total Bilirubin: 0.4 mg/dL (ref 0.3–1.2)
Total Protein: 7.8 g/dL (ref 6.0–8.3)

## 2014-02-02 NOTE — Progress Notes (Signed)
Subjective:    Patient ID: [name] Nielsen, female    DOB: [birth year], 38 y.o.   MRN: [id number]  HPI 38 year old female with past history of hypertension, hypercholesterolemia and hypothyroidism who comes in today for a f/u appt to discuss plans for gastric sleeve surgery.  She states she has had problems with her weight for years.  See previous notes for details.  She has joined the [employer].  Exercising on the elipitical machine and walking on the treadmill. Plans to increase her days of the week she goes.   Is trying to eat less carbs.  Eating more protein.  Drinking more water.  Has to stop eating late.   Overall she feels she is doing well.  Blood pressure is doing well.  Had barium swallow, EKG and CXR.  Still has not seen psychiatry.  Plans to call to schedule.  She has had more headaches recently.  Still manageable.  [place]  Feels this may have contributed.       Past Medical History  Diagnosis Date  . Depression   . Hypertension   . Hypercholesterolemia   . Frequent headaches     hx of  . Heart murmur   . Hypothyroidism   . Hx: UTI (urinary tract infection)     child, s/p urinary procecure    Current Outpatient Prescriptions on File Prior to Visit  Medication Sig Dispense Refill  . ALPRAZolam (XANAX) 0.25 MG tablet Take 1 tablet (0.25 mg total) by mouth daily as needed for anxiety.  20 tablet  0  . cholecalciferol (VITAMIN D) 1000 UNITS tablet Take 1,000 Units by mouth daily.      . Fe Fum-FePoly-Vit C-Vit B3 (INTEGRA) 62.5-62.5-40-3 MG CAPS Take one capsule 3 days per week.  30 capsule  2  . levothyroxine (SYNTHROID, LEVOTHROID) 175 MCG tablet Take 1 tablet (175 mcg total) by mouth daily before breakfast.  30 tablet  11  . lisinopril-hydrochlorothiazide (PRINZIDE,ZESTORETIC) 20-25 MG per tablet Take 1 tablet by mouth daily.  30 tablet  5  . metoprolol succinate (TOPROL-XL) 50 MG 24 hr tablet Take 1 tablet (50 mg total) by mouth daily. Take with or immediately  following a meal.  30 tablet  5   No current facility-administered medications on file prior to visit.    Review of Systems Headaches as outlined.  No lightheadedness or dizziness.  No sinus or allergy symptoms.  No chest pain, tightness or palpitations.  No increased shortness of breath, cough or congestion.  No nausea or vomiting.  No acid reflux.  No abdominal pain or cramping.  No bowel change, such as diarrhea, constipation, BRBPR or melana.  No urine change.  Has tried various methods to lose weight.  Has made some adjustments in her diet, food intake and exercise as outlined.  Needs to be more consistent.        Objective:   Physical Exam  Filed Vitals:   02/02/14 0903  BP: 120/80  Pulse: 76  Temp: 98.4 F (6436.499 C)   38 year old female in no acute distress.   HEENT:  Nares- clear.  Oropharynx - without lesions. NECK:  Supple.  Nontender.  No audible bruit.  HEART:  Appears to be regular. LUNGS:  No crackles or wheezing audible.  Respirations even and unlabored.  RADIAL PULSE:  Equal bilaterally.   ABDOMEN:  Soft, nontender.  Bowel sounds present and normal.  No audible abdominal bruit.   EXTREMITIES:  No increased  edema present.  DP pulses palpable and equal bilaterally.          Assessment & Plan:  DESIRE FOR WEIGHT LOSS.  Planning for gastric sleeve.  Has a history of hypertension and hypercholesterolemia.  She has tried various weight loss methods.  Has tried ephedrine and weight watchers.  Is exercising.  Has adjusted her diet and food intake as outlined.  Has decreased carb intake.  Increased protein intake and water intake.  Follow. Discussed increasing her exercise.   Needs to be more consistent with the diet changes.   HEALTH MAINTENANCE.  Physical 07/05/13.   Mammogram 07/22/13 negative.   Had colonoscopy a couple of years ago.

## 2014-02-02 NOTE — Progress Notes (Signed)
Pre visit review using our clinic review tool, if applicable. No additional management support is needed unless otherwise documented below in the visit note. 

## 2014-02-02 NOTE — Assessment & Plan Note (Addendum)
Intermittent headaches.  Started as a teenager.   Follow.

## 2014-02-04 NOTE — Telephone Encounter (Signed)
Unread mychart message mailed to patient 

## 2014-02-06 ENCOUNTER — Encounter: Payer: Self-pay | Admitting: Internal Medicine

## 2014-02-06 DIAGNOSIS — R7989 Other specified abnormal findings of blood chemistry: Secondary | ICD-10-CM | POA: Insufficient documentation

## 2014-02-06 DIAGNOSIS — R945 Abnormal results of liver function studies: Secondary | ICD-10-CM | POA: Insufficient documentation

## 2014-02-06 NOTE — Assessment & Plan Note (Signed)
Recheck liver panel today.  

## 2014-02-06 NOTE — Assessment & Plan Note (Signed)
Blood pressure doing well.  Follow metabolic panel.  

## 2014-02-17 ENCOUNTER — Other Ambulatory Visit: Payer: Self-pay | Admitting: Internal Medicine

## 2014-02-23 ENCOUNTER — Ambulatory Visit: Payer: Managed Care, Other (non HMO) | Admitting: Internal Medicine

## 2014-04-05 ENCOUNTER — Encounter: Payer: Self-pay | Admitting: Internal Medicine

## 2014-04-05 NOTE — Telephone Encounter (Signed)
Sent via Epic

## 2014-04-15 ENCOUNTER — Encounter: Payer: Self-pay | Admitting: Internal Medicine

## 2014-05-06 ENCOUNTER — Other Ambulatory Visit: Payer: Self-pay | Admitting: Internal Medicine

## 2014-05-06 ENCOUNTER — Ambulatory Visit: Payer: Managed Care, Other (non HMO) | Admitting: Internal Medicine

## 2014-06-04 ENCOUNTER — Other Ambulatory Visit: Payer: Self-pay | Admitting: Internal Medicine

## 2014-06-16 ENCOUNTER — Other Ambulatory Visit (INDEPENDENT_AMBULATORY_CARE_PROVIDER_SITE_OTHER): Payer: Self-pay

## 2014-06-20 ENCOUNTER — Encounter: Payer: Managed Care, Other (non HMO) | Attending: Surgery

## 2014-06-20 DIAGNOSIS — Z01818 Encounter for other preprocedural examination: Secondary | ICD-10-CM | POA: Insufficient documentation

## 2014-06-20 DIAGNOSIS — Z6841 Body Mass Index (BMI) 40.0 and over, adult: Secondary | ICD-10-CM | POA: Insufficient documentation

## 2014-06-20 DIAGNOSIS — Z713 Dietary counseling and surveillance: Secondary | ICD-10-CM | POA: Insufficient documentation

## 2014-06-21 ENCOUNTER — Encounter: Payer: Self-pay | Admitting: Internal Medicine

## 2014-06-21 ENCOUNTER — Encounter (HOSPITAL_COMMUNITY): Payer: Self-pay | Admitting: Pharmacy Technician

## 2014-06-21 NOTE — Patient Instructions (Addendum)
[name] Nielsen  06/21/2014   Your procedure is scheduled on: 07/05/14    Report to [facility].  Follow the Signs to Short Stay Center at   0915     am  Call this number if you have problems the morning of surgery: [phone]   Remember:   Do not eat food or drink liquids after midnight.   Take these medicines the morning of surgery with A SIP OF WATER: metoprolol, levothyroxine    Do not wear jewelry, make-up or nail polish.  Do not wear lotions, powders, or perfumes.  deodorant.  Do not shave 48 hours prior to surgery.   Do not bring valuables to the hospital.  Contacts, dentures or bridgework may not be worn into surgery.  Leave suitcase in the car. After surgery it may be brought to your room.  For patients admitted to the hospital, checkout time is 11:00 AM the day of  discharge.       Please read over the following fact sheets that you were given: [facility] - Preparing for Surgery Before surgery, you can play an important role.  Because skin is not sterile, your skin needs to be as free of germs as possible.  You can reduce the number of germs on your skin by washing with CHG (chlorahexidine gluconate) soap before surgery.  CHG is an antiseptic cleaner which kills germs and bonds with the skin to continue killing germs even after washing. Please DO NOT use if you have an allergy to CHG or antibacterial soaps.  If your skin becomes reddened/irritated stop using the CHG and inform your nurse when you arrive at Short Stay. Do not shave (including legs and underarms) for at least 48 hours prior to the first CHG shower.  You may shave your face/neck. Please follow these instructions carefully:  1.  Shower with CHG Soap the night before surgery and the  morning of Surgery.  2.  If you choose to wash your hair, wash your hair first as usual with your  normal  shampoo.  3.  After you shampoo, rinse your hair and body thoroughly to remove the  shampoo.                            4.  Use CHG as you would any other liquid soap.  You can apply chg directly  to the skin and wash                       Gently with a scrungie or clean washcloth.  5.  Apply the CHG Soap to your body ONLY FROM THE NECK DOWN.   Do not use on face/ open                           Wound or open sores. Avoid contact with eyes, ears mouth and genitals (private parts).                       Wash face,  Genitals (private parts) with your normal soap.             6.  Wash thoroughly, paying special attention to the area where your surgery  will be performed.  7.  Thoroughly rinse your body with warm water from the neck down.  8.  DO NOT shower/wash with your normal soap after using  and rinsing off  the CHG Soap.                9.  Pat yourself dry with a clean towel.            10.  Wear clean pajamas.            11.  Place clean sheets on your bed the night of your first shower and do not  sleep with pets. Day of Surgery : Do not apply any lotions/deodorants the morning of surgery.  Please wear clean clothes to the hospital/surgery center.  FAILURE TO FOLLOW THESE INSTRUCTIONS MAY RESULT IN THE CANCELLATION OF YOUR SURGERY PATIENT SIGNATURE_________________________________  NURSE SIGNATURE__________________________________  ________________________________________________________________________  coughing and deep breathing exercises, leg exercises

## 2014-06-21 NOTE — Progress Notes (Signed)
Need orders for 07-05-14 surgery pre op is 06-22-14 830 am thanks

## 2014-06-22 ENCOUNTER — Encounter (HOSPITAL_COMMUNITY)
Admission: RE | Admit: 2014-06-22 | Discharge: 2014-06-22 | Disposition: A | Discharge status: Home / Self Care | Payer: Managed Care, Other (non HMO) | Source: Ambulatory Visit | Attending: Surgery | Admitting: Surgery

## 2014-06-22 ENCOUNTER — Other Ambulatory Visit (INDEPENDENT_AMBULATORY_CARE_PROVIDER_SITE_OTHER): Payer: Self-pay | Admitting: Surgery

## 2014-06-22 ENCOUNTER — Encounter (HOSPITAL_COMMUNITY): Payer: Self-pay

## 2014-06-22 DIAGNOSIS — Z01812 Encounter for preprocedural laboratory examination: Secondary | ICD-10-CM | POA: Diagnosis not present

## 2014-06-22 DIAGNOSIS — Z6841 Body Mass Index (BMI) 40.0 and over, adult: Secondary | ICD-10-CM | POA: Insufficient documentation

## 2014-06-22 HISTORY — DX: Anemia, unspecified: D64.9

## 2014-06-22 LAB — CBC
HEMATOCRIT: 36.9 % (ref 36.0–46.0)
HEMOGLOBIN: 11.8 g/dL — AB (ref 12.0–15.0)
MCH: 24.2 pg — ABNORMAL LOW (ref 26.0–34.0)
MCHC: 32 g/dL (ref 30.0–36.0)
MCV: 75.6 fL — AB (ref 78.0–100.0)
Platelets: 266 10*3/uL (ref 150–400)
RBC: 4.88 MIL/uL (ref 3.87–5.11)
RDW: 13.9 % (ref 11.5–15.5)
WBC: 7 10*3/uL (ref 4.0–10.5)

## 2014-06-22 LAB — HCG, SERUM, QUALITATIVE: Preg, Serum: NEGATIVE

## 2014-06-22 LAB — BASIC METABOLIC PANEL
Anion gap: 11 (ref 5–15)
BUN: 17 mg/dL (ref 6–23)
CHLORIDE: 101 meq/L (ref 96–112)
CO2: 26 mEq/L (ref 19–32)
Calcium: 9.7 mg/dL (ref 8.4–10.5)
Creatinine, Ser: 0.93 mg/dL (ref 0.50–1.10)
GFR calc Af Amer: 88 mL/min — ABNORMAL LOW (ref >90)
GFR calc non Af Amer: 76 mL/min — ABNORMAL LOW (ref >90)
GLUCOSE: 96 mg/dL (ref 70–99)
POTASSIUM: 3.8 meq/L (ref 3.7–5.3)
Sodium: 138 mEq/L (ref 137–147)

## 2014-06-22 MED ORDER — HYDROCODONE-ACETAMINOPHEN 7.5-325 MG/15ML PO SOLN: 15.0000 mL | ORAL | Status: DC | PRN

## 2014-06-22 NOTE — Progress Notes (Signed)
Chief Complaint: Morbid obesity BMI 40  History of Present Illness: [name] Nielsen is an 38 y.o. female who has been to our seminar and who is interested in a sleeve gastrectomy. I went over the procedure in some detail including complications not listed limited to leaks and injury to the spleen. She is excited to go ahead and begin a workup. With her is a list of her attempts at weight loss including Adipex Weight Watchers and low calorie diets with some weight loss but with weight regain. She currently takes medications for hypertension and hypothyroidism. She is followed by Dr. Parks Ranger.  Past Medical History   Diagnosis  Date   .  Depression    .  Hypertension    .  Hypercholesterolemia    .  Frequent headaches      hx of   .  Heart murmur    .  Hypothyroidism    .  Hx: UTI (urinary tract infection)      child, s/p urinary procecure    Past Surgical History   Procedure  Laterality  Date   .  Cholecystectomy   2012   .  Tubal ligation   1999    Current Outpatient Prescriptions   Medication  Sig  Dispense  Refill   .  cholecalciferol (VITAMIN D) 1000 UNITS tablet  Take 1,000 Units by mouth daily.     .  Fe Fum-FePoly-Vit C-Vit B3 (INTEGRA) 62.5-62.5-40-3 MG CAPS  Take one capsule 3 days per week.  30 capsule  2   .  levothyroxine (SYNTHROID, LEVOTHROID) 175 MCG tablet  Take 1 tablet (175 mcg total) by mouth daily before breakfast.  30 tablet  11   .  lisinopril-hydrochlorothiazide (PRINZIDE,ZESTORETIC) 20-25 MG per tablet  Take 1 tablet by mouth daily.  30 tablet  5   .  metoprolol succinate (TOPROL-XL) 50 MG 24 hr tablet  Take 1 tablet (50 mg total) by mouth daily. Take with or immediately following a meal.  30 tablet  5    No current facility-administered medications for this visit.   Review of patient's allergies indicates no known allergies.  Family History   Problem  Relation  Age of Onset   .  Cancer  Mother      brain   .  Diabetes  Father    .  Hypertension  Father     .  Kidney disease  Father    .  Stroke  Father    .  Cancer  Father      prostate   Social History: reports that she has never smoked. She has never used smokeless tobacco. She reports that she does not drink alcohol or use illicit drugs.  REVIEW OF SYSTEMS - PERTINENT POSITIVES ONLY:  No history of DVT.  Physical Exam:  Blood pressure 138/82, pulse 68, temperature 98.1 F (36.7 C), temperature source Temporal, resp. rate 16, height  (1.702 m), weight 257 lb 12.8 oz (116.937 kg).  Body mass index is 40.37 kg/(m^2).  Gen: WDWN white female NAD  Neurological: Alert and oriented to person, place, and time. Motor and sensory function is grossly intact  Head: Normocephalic and atraumatic.  Eyes: Conjunctivae are normal. Pupils are equal, round, and reactive to light. No scleral icterus.  Neck: Normal range of motion. Neck supple. No tracheal deviation or thyromegaly present.  Cardiovascular: SR without murmurs or gallops. No carotid bruits  Respiratory: Effort normal. No respiratory distress. No chest wall tenderness. Breath sounds  normal. No wheezes, rales or rhonchi.  Abdomen: Nontender. Obese.  GU:  Musculoskeletal: Normal range of motion. Extremities are nontender. No cyanosis, edema or clubbing noted Lymphadenopathy: No cervical, preauricular, postauricular or axillary adenopathy is present Skin: Skin is warm and dry. No rash noted. No diaphoresis. No erythema. No pallor. Pscyh: Normal mood and affect. Behavior is normal. Judgment and thought content normal.  LABORATORY RESULTS:  No results found for this or any previous visit (from the past 48 hour(s)).  RADIOLOGY RESULTS:  No results found.  Problem List:  Patient Active Problem List    Diagnosis  Date Noted   .  Anemia  07/05/2013   .  Headache(784.0)  05/05/2013   .  Essential hypertension, benign  05/05/2013   .  Hypercholesterolemia  05/05/2013   .  Hypothyroidism  05/05/2013   Assessment & Plan:  Morbid obesity and  felt to be a good candidate for a sleeve gastrectomy. She is ready and scheduled for surgery on 07/05/14 Matt B. Daphine Deutscher, MD, [facility]  [phone] beeper  [phone]

## 2014-06-22 NOTE — Progress Notes (Signed)
  Pre-Operative Nutrition Class:  Appt start time: 0856   End time:  1830.  Patient was seen on 06/20/14 for Pre-Operative Bariatric Surgery Education at [facility].   Surgery date: 07/05/14 Surgery type: gastric sleeve Start weight at [facility]: 262.5 lbs on 11/13/2013 Weight today: 259.5 lbs  TANITA  BODY COMP RESULTS  06/20/14   BMI (kg/m^2) 40.6   Fat Mass (lbs) 129   Fat Free Mass (lbs) 130.5   Total Body Water (lbs) 95.5   Samples given per MNT protocol. Patient educated on appropriate usage: Premier protein shake (strawberry - qty 1) Lot #: [id number] Exp: 09/2014  Unjury protein powder (unflavored - qty 1) Lot #: [id number] Exp: 08/2015  Bariactiv Calcium citrate (qty 1) Lot #: [id number] Exp: 03/2015  Celebrate Multivitamin (Mandarin orange - qty 1)  Lot #: [id number] Exp:03/2015  PB2 Lot #: [id number] Exp: 05/2015  The following the learning objectives were met by the patient during this course:  Identify Pre-Op Dietary Goals and will begin 2 weeks pre-operatively  Identify appropriate sources of fluids and proteins   State protein recommendations and appropriate sources pre and post-operatively  Identify Post-Operative Dietary Goals and will follow for 2 weeks post-operatively  Identify appropriate multivitamin and calcium sources  Describe the need for physical activity post-operatively and will follow MD recommendations  State when to call healthcare provider regarding medication questions or post-operative complications  Handouts given during class include:  Pre-Op Bariatric Surgery Diet Handout  Protein Shake Handout  Post-Op Bariatric Surgery Nutrition Handout  [facility]EL[facility] Program Information Flyer  Support Group Information Flyer  [facility] Bariatric Supplements Price List  Follow-Up Plan: Patient will follow-up at [facility] 2 weeks post operatively for diet advancement per MD.

## 2014-07-05 ENCOUNTER — Encounter (HOSPITAL_COMMUNITY): Payer: Self-pay | Admitting: *Deleted

## 2014-07-05 ENCOUNTER — Inpatient Hospital Stay (HOSPITAL_COMMUNITY)
Admission: RE | Admit: 2014-07-05 | Discharge: 2014-07-08 | DRG: 621 | Disposition: A | Discharge status: Home / Self Care | Payer: Managed Care, Other (non HMO) | Source: Ambulatory Visit | Attending: Surgery | Admitting: Surgery

## 2014-07-05 ENCOUNTER — Inpatient Hospital Stay (HOSPITAL_COMMUNITY): Payer: Managed Care, Other (non HMO) | Admitting: Anesthesiology

## 2014-07-05 ENCOUNTER — Encounter (HOSPITAL_COMMUNITY): Payer: Managed Care, Other (non HMO) | Admitting: Anesthesiology

## 2014-07-05 ENCOUNTER — Encounter (HOSPITAL_COMMUNITY)
Admission: RE | Disposition: A | Discharge status: Home / Self Care | Payer: Self-pay | Source: Ambulatory Visit | Attending: Surgery

## 2014-07-05 DIAGNOSIS — Z833 Family history of diabetes mellitus: Secondary | ICD-10-CM

## 2014-07-05 DIAGNOSIS — Z6841 Body Mass Index (BMI) 40.0 and over, adult: Secondary | ICD-10-CM

## 2014-07-05 DIAGNOSIS — I10 Essential (primary) hypertension: Secondary | ICD-10-CM | POA: Diagnosis present

## 2014-07-05 DIAGNOSIS — Z809 Family history of malignant neoplasm, unspecified: Secondary | ICD-10-CM

## 2014-07-05 DIAGNOSIS — Z9884 Bariatric surgery status: Secondary | ICD-10-CM

## 2014-07-05 DIAGNOSIS — E780 Pure hypercholesterolemia: Secondary | ICD-10-CM | POA: Diagnosis present

## 2014-07-05 DIAGNOSIS — Z01812 Encounter for preprocedural laboratory examination: Secondary | ICD-10-CM

## 2014-07-05 DIAGNOSIS — Z791 Long term (current) use of non-steroidal anti-inflammatories (NSAID): Secondary | ICD-10-CM

## 2014-07-05 DIAGNOSIS — F418 Other specified anxiety disorders: Secondary | ICD-10-CM | POA: Diagnosis present

## 2014-07-05 DIAGNOSIS — Z8249 Family history of ischemic heart disease and other diseases of the circulatory system: Secondary | ICD-10-CM | POA: Diagnosis not present

## 2014-07-05 DIAGNOSIS — Z79899 Other long term (current) drug therapy: Secondary | ICD-10-CM | POA: Diagnosis not present

## 2014-07-05 DIAGNOSIS — E079 Disorder of thyroid, unspecified: Secondary | ICD-10-CM | POA: Diagnosis present

## 2014-07-05 HISTORY — PX: LAPAROSCOPIC GASTRIC SLEEVE RESECTION: SHX5895

## 2014-07-05 LAB — CBC WITH DIFFERENTIAL/PLATELET
Basophils Absolute: 0 10*3/uL (ref 0.0–0.1)
Basophils Relative: 0 % (ref 0–1)
EOS ABS: 0.1 10*3/uL (ref 0.0–0.7)
Eosinophils Relative: 1 % (ref 0–5)
HCT: 38 % (ref 36.0–46.0)
HEMOGLOBIN: 12.4 g/dL (ref 12.0–15.0)
LYMPHS ABS: 2.7 10*3/uL (ref 0.7–4.0)
LYMPHS PCT: 43 % (ref 12–46)
MCH: 24.5 pg — AB (ref 26.0–34.0)
MCHC: 32.6 g/dL (ref 30.0–36.0)
MCV: 75.1 fL — AB (ref 78.0–100.0)
MONOS PCT: 9 % (ref 3–12)
Monocytes Absolute: 0.6 10*3/uL (ref 0.1–1.0)
Neutro Abs: 2.9 10*3/uL (ref 1.7–7.7)
Neutrophils Relative %: 47 % (ref 43–77)
Platelets: 276 10*3/uL (ref 150–400)
RBC: 5.06 MIL/uL (ref 3.87–5.11)
RDW: 14.1 % (ref 11.5–15.5)
WBC: 6.2 10*3/uL (ref 4.0–10.5)

## 2014-07-05 LAB — COMPREHENSIVE METABOLIC PANEL
ALT: 31 U/L (ref 0–35)
ANION GAP: 15 (ref 5–15)
AST: 24 U/L (ref 0–37)
Albumin: 4.3 g/dL (ref 3.5–5.2)
Alkaline Phosphatase: 50 U/L (ref 39–117)
BUN: 19 mg/dL (ref 6–23)
CALCIUM: 9.9 mg/dL (ref 8.4–10.5)
CO2: 27 meq/L (ref 19–32)
CREATININE: 0.95 mg/dL (ref 0.50–1.10)
Chloride: 96 mEq/L (ref 96–112)
GFR, EST AFRICAN AMERICAN: 86 mL/min — AB (ref >90)
GFR, EST NON AFRICAN AMERICAN: 74 mL/min — AB (ref >90)
Glucose, Bld: 98 mg/dL (ref 70–99)
Potassium: 3.7 mEq/L (ref 3.7–5.3)
SODIUM: 138 meq/L (ref 137–147)
Total Bilirubin: 0.3 mg/dL (ref 0.3–1.2)
Total Protein: 8.2 g/dL (ref 6.0–8.3)

## 2014-07-05 LAB — CBC
HCT: 34.8 % — ABNORMAL LOW (ref 36.0–46.0)
Hemoglobin: 11.3 g/dL — ABNORMAL LOW (ref 12.0–15.0)
MCH: 24.5 pg — AB (ref 26.0–34.0)
MCHC: 32.5 g/dL (ref 30.0–36.0)
MCV: 75.5 fL — AB (ref 78.0–100.0)
PLATELETS: 251 10*3/uL (ref 150–400)
RBC: 4.61 MIL/uL (ref 3.87–5.11)
RDW: 14.1 % (ref 11.5–15.5)
WBC: 11.3 10*3/uL — ABNORMAL HIGH (ref 4.0–10.5)

## 2014-07-05 LAB — GLUCOSE, CAPILLARY: GLUCOSE-CAPILLARY: 187 mg/dL — AB (ref 70–99)

## 2014-07-05 LAB — CREATININE, SERUM
CREATININE: 0.92 mg/dL (ref 0.50–1.10)
GFR calc Af Amer: 89 mL/min — ABNORMAL LOW (ref >90)
GFR calc non Af Amer: 77 mL/min — ABNORMAL LOW (ref >90)

## 2014-07-05 LAB — PREGNANCY, URINE: PREG TEST UR: NEGATIVE

## 2014-07-05 SURGERY — GASTRECTOMY, SLEEVE, LAPAROSCOPIC
Anesthesia: General | Site: Abdomen

## 2014-07-05 MED ORDER — FENTANYL CITRATE 0.05 MG/ML IJ SOLN
INTRAMUSCULAR | Status: AC
  Filled 2014-07-05: qty 2

## 2014-07-05 MED ORDER — UNJURY CHOCOLATE CLASSIC POWDER: 2.0000 [oz_av] | Freq: Four times a day (QID) | ORAL | Status: DC

## 2014-07-05 MED ORDER — ONDANSETRON HCL 4 MG/2ML IJ SOLN
INTRAMUSCULAR | Status: AC
  Filled 2014-07-05: qty 2

## 2014-07-05 MED ORDER — KCL IN DEXTROSE-NACL 20-5-0.45 MEQ/L-%-% IV SOLN
INTRAVENOUS | Status: DC
  Administered 2014-07-05 – 2014-07-06 (×2): via INTRAVENOUS
  Administered 2014-07-06: 100 mL/h via INTRAVENOUS
  Administered 2014-07-07 – 2014-07-08 (×3): via INTRAVENOUS
  Filled 2014-07-05 (×8): qty 1000

## 2014-07-05 MED ORDER — HYDROMORPHONE HCL 2 MG/ML IJ SOLN
INTRAMUSCULAR | Status: AC
  Filled 2014-07-05: qty 1

## 2014-07-05 MED ORDER — BUPIVACAINE LIPOSOME 1.3 % IJ SUSP
20.0000 mL | Freq: Once | INTRAMUSCULAR | Status: AC
  Administered 2014-07-05: 20 mL
  Filled 2014-07-05: qty 20

## 2014-07-05 MED ORDER — MIDAZOLAM HCL 5 MG/5ML IJ SOLN
INTRAMUSCULAR | Status: DC | PRN
  Administered 2014-07-05: 2 mg via INTRAVENOUS

## 2014-07-05 MED ORDER — FENTANYL CITRATE 0.05 MG/ML IJ SOLN
25.0000 ug | INTRAMUSCULAR | Status: DC | PRN
  Administered 2014-07-05 (×2): 50 ug via INTRAVENOUS

## 2014-07-05 MED ORDER — MIDAZOLAM HCL 2 MG/2ML IJ SOLN
INTRAMUSCULAR | Status: AC
  Filled 2014-07-05: qty 2

## 2014-07-05 MED ORDER — HEPARIN SODIUM (PORCINE) 5000 UNIT/ML IJ SOLN
5000.0000 [IU] | Freq: Three times a day (TID) | INTRAMUSCULAR | Status: DC
  Administered 2014-07-05 – 2014-07-08 (×8): 5000 [IU] via SUBCUTANEOUS
  Filled 2014-07-05 (×11): qty 1

## 2014-07-05 MED ORDER — DEXAMETHASONE SODIUM PHOSPHATE 10 MG/ML IJ SOLN
INTRAMUSCULAR | Status: DC | PRN
Start: 2014-07-05 — End: 2014-07-05
  Administered 2014-07-05: 10 mg via INTRAVENOUS

## 2014-07-05 MED ORDER — NEOSTIGMINE METHYLSULFATE 10 MG/10ML IV SOLN
INTRAVENOUS | Status: AC
  Filled 2014-07-05: qty 1

## 2014-07-05 MED ORDER — LIDOCAINE HCL (CARDIAC) 20 MG/ML IV SOLN
INTRAVENOUS | Status: DC | PRN
  Administered 2014-07-05: 50 mg via INTRAVENOUS

## 2014-07-05 MED ORDER — FENTANYL CITRATE 0.05 MG/ML IJ SOLN
INTRAMUSCULAR | Status: AC
  Filled 2014-07-05: qty 5

## 2014-07-05 MED ORDER — GLYCOPYRROLATE 0.2 MG/ML IJ SOLN
INTRAMUSCULAR | Status: DC | PRN
  Administered 2014-07-05: .8 mg via INTRAVENOUS

## 2014-07-05 MED ORDER — SODIUM CHLORIDE 0.9 % IJ SOLN
INTRAMUSCULAR | Status: AC
  Filled 2014-07-05: qty 10

## 2014-07-05 MED ORDER — SODIUM CHLORIDE 0.9 % IJ SOLN
INTRAMUSCULAR | Status: DC
Start: 2014-07-05 — End: 2014-07-05
  Filled 2014-07-05: qty 10

## 2014-07-05 MED ORDER — GLYCOPYRROLATE 0.2 MG/ML IJ SOLN
INTRAMUSCULAR | Status: AC
  Filled 2014-07-05: qty 4

## 2014-07-05 MED ORDER — PROMETHAZINE HCL 25 MG/ML IJ SOLN
6.2500 mg | INTRAMUSCULAR | Status: DC | PRN
  Administered 2014-07-05: 6.25 mg via INTRAVENOUS

## 2014-07-05 MED ORDER — MEPERIDINE HCL 50 MG/ML IJ SOLN: 6.2500 mg | INTRAMUSCULAR | Status: DC | PRN

## 2014-07-05 MED ORDER — LIDOCAINE HCL (CARDIAC) 20 MG/ML IV SOLN
INTRAVENOUS | Status: AC
  Filled 2014-07-05: qty 5

## 2014-07-05 MED ORDER — HEPARIN SODIUM (PORCINE) 5000 UNIT/ML IJ SOLN
5000.0000 [IU] | INTRAMUSCULAR | Status: AC
  Administered 2014-07-05: 5000 [IU] via SUBCUTANEOUS
  Filled 2014-07-05: qty 1

## 2014-07-05 MED ORDER — DEXTROSE 5 % IV SOLN
INTRAVENOUS | Status: AC
  Filled 2014-07-05: qty 2

## 2014-07-05 MED ORDER — PROPOFOL 10 MG/ML IV BOLUS
INTRAVENOUS | Status: AC
  Filled 2014-07-05: qty 20

## 2014-07-05 MED ORDER — ACETAMINOPHEN 160 MG/5ML PO SOLN
650.0000 mg | ORAL | Status: DC | PRN
  Administered 2014-07-07: 650 mg via ORAL
  Filled 2014-07-05: qty 20.3

## 2014-07-05 MED ORDER — HYDROMORPHONE HCL 1 MG/ML IJ SOLN
INTRAMUSCULAR | Status: DC | PRN
  Administered 2014-07-05: 1 mg via INTRAVENOUS
  Administered 2014-07-05 (×2): 0.5 mg via INTRAVENOUS

## 2014-07-05 MED ORDER — DEXTROSE 5 % IV SOLN
2.0000 g | INTRAVENOUS | Status: AC
  Administered 2014-07-05: 2 g via INTRAVENOUS

## 2014-07-05 MED ORDER — ONDANSETRON HCL 4 MG/2ML IJ SOLN
INTRAMUSCULAR | Status: DC | PRN
  Administered 2014-07-05: 4 mg via INTRAVENOUS

## 2014-07-05 MED ORDER — PROPOFOL 10 MG/ML IV BOLUS
INTRAVENOUS | Status: DC | PRN
  Administered 2014-07-05: 200 mg via INTRAVENOUS

## 2014-07-05 MED ORDER — ROCURONIUM BROMIDE 100 MG/10ML IV SOLN
INTRAVENOUS | Status: AC
  Filled 2014-07-05: qty 1

## 2014-07-05 MED ORDER — LACTATED RINGERS IV SOLN: INTRAVENOUS | Status: DC | PRN

## 2014-07-05 MED ORDER — EPHEDRINE SULFATE 50 MG/ML IJ SOLN
INTRAMUSCULAR | Status: DC | PRN
  Administered 2014-07-05: 10 mg via INTRAVENOUS

## 2014-07-05 MED ORDER — UNJURY VANILLA POWDER
2.0000 [oz_av] | Freq: Four times a day (QID) | ORAL | Status: DC
  Administered 2014-07-08: 2 [oz_av] via ORAL

## 2014-07-05 MED ORDER — OXYCODONE HCL 5 MG/5ML PO SOLN: 5.0000 mg | ORAL | Status: DC | PRN

## 2014-07-05 MED ORDER — LACTATED RINGERS IV SOLN
INTRAVENOUS | Status: DC
  Administered 2014-07-05: 14:00:00 via INTRAVENOUS
  Administered 2014-07-05: 1000 mL via INTRAVENOUS
  Administered 2014-07-05: 12:00:00 via INTRAVENOUS

## 2014-07-05 MED ORDER — SUCCINYLCHOLINE CHLORIDE 20 MG/ML IJ SOLN
INTRAMUSCULAR | Status: DC | PRN
  Administered 2014-07-05: 140 mg via INTRAVENOUS

## 2014-07-05 MED ORDER — NEOSTIGMINE METHYLSULFATE 10 MG/10ML IV SOLN
INTRAVENOUS | Status: DC | PRN
  Administered 2014-07-05: 5 mg via INTRAVENOUS

## 2014-07-05 MED ORDER — ROCURONIUM BROMIDE 100 MG/10ML IV SOLN
INTRAVENOUS | Status: DC | PRN
Start: 2014-07-05 — End: 2014-07-05
  Administered 2014-07-05: 35 mg via INTRAVENOUS
  Administered 2014-07-05: 10 mg via INTRAVENOUS
  Administered 2014-07-05: 5 mg via INTRAVENOUS
  Administered 2014-07-05: 10 mg via INTRAVENOUS

## 2014-07-05 MED ORDER — UNJURY CHICKEN SOUP POWDER
2.0000 [oz_av] | Freq: Four times a day (QID) | ORAL | Status: DC
  Administered 2014-07-07: 2 [oz_av] via ORAL
  Administered 2014-07-08: 4 [oz_av] via ORAL

## 2014-07-05 MED ORDER — PROMETHAZINE HCL 25 MG/ML IJ SOLN
INTRAMUSCULAR | Status: AC
  Filled 2014-07-05: qty 1

## 2014-07-05 MED ORDER — DEXAMETHASONE SODIUM PHOSPHATE 10 MG/ML IJ SOLN
INTRAMUSCULAR | Status: AC
  Filled 2014-07-05: qty 1

## 2014-07-05 MED ORDER — ACETAMINOPHEN 160 MG/5ML PO SOLN
325.0000 mg | ORAL | Status: DC | PRN
  Filled 2014-07-05: qty 20.3

## 2014-07-05 MED ORDER — MORPHINE SULFATE 2 MG/ML IJ SOLN
2.0000 mg | INTRAMUSCULAR | Status: DC | PRN
  Administered 2014-07-05: 4 mg via INTRAVENOUS
  Administered 2014-07-05: 2 mg via INTRAVENOUS
  Administered 2014-07-05: 4 mg via INTRAVENOUS
  Administered 2014-07-05: 2 mg via INTRAVENOUS
  Administered 2014-07-06 (×2): 4 mg via INTRAVENOUS
  Filled 2014-07-05 (×3): qty 2
  Filled 2014-07-05: qty 1
  Filled 2014-07-05: qty 2
  Filled 2014-07-05: qty 1

## 2014-07-05 MED ORDER — FENTANYL CITRATE 0.05 MG/ML IJ SOLN
INTRAMUSCULAR | Status: DC | PRN
  Administered 2014-07-05 (×3): 50 ug via INTRAVENOUS
  Administered 2014-07-05: 100 ug via INTRAVENOUS

## 2014-07-05 MED ORDER — LACTATED RINGERS IR SOLN
Status: DC | PRN
  Administered 2014-07-05: 3000 mL

## 2014-07-05 MED ORDER — ONDANSETRON HCL 4 MG/2ML IJ SOLN
4.0000 mg | INTRAMUSCULAR | Status: DC | PRN
  Administered 2014-07-05 – 2014-07-07 (×7): 4 mg via INTRAVENOUS
  Filled 2014-07-05 (×7): qty 2

## 2014-07-05 SURGICAL SUPPLY — 60 items
APPLICATOR COTTON TIP 6IN STRL (MISCELLANEOUS) IMPLANT
APPLIER CLIP ROT 10 11.4 M/L (STAPLE)
APPLIER CLIP ROT 13.4 12 LRG (CLIP)
BLADE HEX COATED 2.75 (ELECTRODE) ×3 IMPLANT
BLADE SURG 15 STRL LF DISP TIS (BLADE) ×1 IMPLANT
BLADE SURG 15 STRL SS (BLADE) ×2
CABLE HIGH FREQUENCY MONO STRZ (ELECTRODE) IMPLANT
CLIP APPLIE ROT 10 11.4 M/L (STAPLE) IMPLANT
CLIP APPLIE ROT 13.4 12 LRG (CLIP) IMPLANT
DERMABOND ADVANCED (GAUZE/BANDAGES/DRESSINGS) ×2
DERMABOND ADVANCED .7 DNX12 (GAUZE/BANDAGES/DRESSINGS) ×1 IMPLANT
DEVICE SUT QUICK LOAD TK 5 (STAPLE) IMPLANT
DEVICE SUT TI-KNOT TK 5X26 (MISCELLANEOUS) IMPLANT
DEVICE SUTURE ENDOST 10MM (ENDOMECHANICALS) IMPLANT
DEVICE TI KNOT TK5 (MISCELLANEOUS)
DEVICE TROCAR PUNCTURE CLOSURE (ENDOMECHANICALS) ×3 IMPLANT
DISSECTOR BLUNT TIP ENDO 5MM (MISCELLANEOUS) ×3 IMPLANT
DRAPE CAMERA CLOSED 9X96 (DRAPES) ×3 IMPLANT
DRAPE UNIVERSAL PACK (DRAPES) ×3 IMPLANT
ELECT REM PT RETURN 9FT ADLT (ELECTROSURGICAL) ×3
ELECTRODE REM PT RTRN 9FT ADLT (ELECTROSURGICAL) ×1 IMPLANT
GAUZE SPONGE 4X4 12PLY STRL (GAUZE/BANDAGES/DRESSINGS) IMPLANT
GLOVE BIOGEL M 8.0 STRL (GLOVE) ×3 IMPLANT
GOWN STRL REUS W/TWL XL LVL3 (GOWN DISPOSABLE) ×12 IMPLANT
HANDLE STAPLE EGIA 4 XL (STAPLE) ×3 IMPLANT
HOVERMATT SINGLE USE (MISCELLANEOUS) ×3 IMPLANT
KIT BASIN OR (CUSTOM PROCEDURE TRAY) ×3 IMPLANT
NEEDLE SPNL 22GX3.5 QUINCKE BK (NEEDLE) ×3 IMPLANT
PEN SKIN MARKING BROAD (MISCELLANEOUS) ×3 IMPLANT
QUICK LOAD TK 5 (STAPLE)
RELOAD ENDO STITCH (ENDOMECHANICALS) IMPLANT
RELOAD TRI 45 ART MED THCK BLK (STAPLE) ×3 IMPLANT
RELOAD TRI 45 ART MED THCK PUR (STAPLE) ×3 IMPLANT
RELOAD TRI 60 ART MED THCK BLK (STAPLE) ×3 IMPLANT
RELOAD TRI 60 ART MED THCK PUR (STAPLE) ×12 IMPLANT
SCISSORS LAP 5X45 EPIX DISP (ENDOMECHANICALS) ×3 IMPLANT
SCRUB PCMX 4 OZ (MISCELLANEOUS) ×6 IMPLANT
SEALANT SURGICAL APPL DUAL CAN (MISCELLANEOUS) IMPLANT
SET IRRIG TUBING LAPAROSCOPIC (IRRIGATION / IRRIGATOR) ×3 IMPLANT
SHEARS CURVED HARMONIC AC 45CM (MISCELLANEOUS) ×3 IMPLANT
SLEEVE ADV FIXATION 5X100MM (TROCAR) ×6 IMPLANT
SLEEVE GASTRECTOMY 36FR VISIGI (MISCELLANEOUS) ×3 IMPLANT
SOLUTION ANTI FOG 6CC (MISCELLANEOUS) ×3 IMPLANT
SPONGE LAP 18X18 X RAY DECT (DISPOSABLE) ×3 IMPLANT
STAPLER VISISTAT 35W (STAPLE) ×3 IMPLANT
SUT VIC AB 4-0 SH 18 (SUTURE) ×3 IMPLANT
SYR 20CC LL (SYRINGE) ×3 IMPLANT
SYR 50ML LL SCALE MARK (SYRINGE) ×3 IMPLANT
TOWEL OR 17X26 10 PK STRL BLUE (TOWEL DISPOSABLE) ×6 IMPLANT
TOWEL OR NON WOVEN STRL DISP B (DISPOSABLE) ×3 IMPLANT
TRAY FOLEY CATH 14FRSI W/METER (CATHETERS) ×3 IMPLANT
TROCAR ADV FIXATION 12X100MM (TROCAR) ×3 IMPLANT
TROCAR ADV FIXATION 5X100MM (TROCAR) ×3 IMPLANT
TROCAR BLADELESS 15MM (ENDOMECHANICALS) ×3 IMPLANT
TROCAR BLADELESS OPT 5 100 (ENDOMECHANICALS) ×3 IMPLANT
TUBE CALIBRATION LAPBAND (TUBING) IMPLANT
TUBING CONNECTING 10 (TUBING) ×2 IMPLANT
TUBING CONNECTING 10' (TUBING) ×1
TUBING ENDO SMARTCAP (MISCELLANEOUS) ×3 IMPLANT
TUBING FILTER THERMOFLATOR (ELECTROSURGICAL) ×3 IMPLANT

## 2014-07-05 NOTE — H&P (Signed)
Mariselda Badalamenti 06/22/2014 5:05 PM Location: [facility] Patient #: [id number] DOB: 04-28-1976 Married / Language: English / Race: Refused to Report/Unreported Female  History of Present Illness Molli Hazard B. Daphine Deutscher MD; 06/22/2014 6:10 PM) Patient words: pre-op visit gastric sleeve.  The patient is a 38 year old female who presents for a bariatric surgery evaluation. as a preop visit for [name] who is scheduled to undergo a sleeve gastrectomyon September 29.She has researched this and has no further questions.I entered her orders into Epic.Updated her history and physical.I gave her a prescription for Hycet. she does nothave any symptoms of reflux.   Other Problems Sinclair Grooms [place], [place]; 06/22/2014 5:05 PM) Anxiety Disorder Depression Heart murmur High blood pressure Hypercholesterolemia Thyroid Disease  Past Surgical History Sinclair Grooms [place], [place]; 06/22/2014 5:05 PM) Gallbladder Surgery - Laparoscopic Oral Surgery  Diagnostic Studies History Sinclair Grooms [place], [place]; 06/22/2014 5:05 PM) Colonoscopy 1-5 years ago Mammogram within last year Pap Smear 1-5 years ago  Allergies Sinclair Grooms [place], [place]; 06/22/2014 5:23 PM) No Known Drug Allergies09/16/2015  Medication History (Dahionnarah [place], RMA; 06/22/2014 5:24 PM) Tylenol (500MG  Capsule, Oral) Active. Advil (200MG  Capsule, Oral) Active. Synthroid ( Tablet, Oral) Active. Lisinopril-Hydrochlorothiazide (20-25MG  Tablet, Oral) Active. Metoprolol Succinate ER (50MG  Tablet ER 24HR, Oral) Active. Medications Reconciled  Social History Sinclair Grooms [place], [place]; 06/22/2014 5:05 PM) Caffeine use Coffee, Tea. No alcohol use No drug use Tobacco use Never smoker.  Family History Sinclair Grooms [place], [place]; 06/22/2014 5:05 PM) Arthritis Mother. Cancer Mother. Depression Daughter, Father, Mother, Son. Diabetes Mellitus Brother, Father, Sister. Hypertension  Father. Migraine Headache Daughter, Sister. Prostate Cancer Father. Thyroid problems Mother.  Pregnancy / Birth History Sinclair Grooms [place], [place]; 06/22/2014 5:05 PM) Age at menarche 13 years. Gravida 3 Maternal age 6-20 Para 3 Regular periods  Review of Systems (Dahionnarah [place] RMA; 06/22/2014 5:05 PM) General Not Present- Appetite Loss, Chills, Fatigue, Fever, Night Sweats, Weight Gain and Weight Loss. Skin Not Present- Change in Wart/Mole, Dryness, Hives, Jaundice, New Lesions, Non-Healing Wounds, Rash and Ulcer. HEENT Not Present- Earache, Hearing Loss, Hoarseness, Nose Bleed, Oral Ulcers, Ringing in the Ears, Seasonal Allergies, Sinus Pain, Sore Throat, Visual Disturbances, Wears glasses/contact lenses and Yellow Eyes. Respiratory Not Present- Bloody sputum, Chronic Cough, Difficulty Breathing, Snoring and Wheezing. Breast Not Present- Breast Mass, Breast Pain, Nipple Discharge and Skin Changes. Cardiovascular Not Present- Chest Pain, Difficulty Breathing Lying Down, Leg Cramps, Palpitations, Rapid Heart Rate, Shortness of Breath and Swelling of Extremities. Gastrointestinal Not Present- Abdominal Pain, Bloating, Bloody Stool, Change in Bowel Habits, Chronic diarrhea, Constipation, Difficulty Swallowing, Excessive gas, Gets full quickly at meals, Hemorrhoids, Indigestion, Nausea, Rectal Pain and Vomiting. Female Genitourinary Not Present- Frequency, Nocturia, Painful Urination, Pelvic Pain and Urgency. Musculoskeletal Present- Back Pain. Not Present- Joint Pain, Joint Stiffness, Muscle Pain, Muscle Weakness and Swelling of Extremities. Neurological Not Present- Decreased Memory, Fainting, Headaches, Numbness, Seizures, Tingling, Tremor, Trouble walking and Weakness. Psychiatric Not Present- Anxiety, Bipolar, Change in Sleep Pattern, Depression, Fearful and Frequent crying. Endocrine Not Present- Cold Intolerance, Excessive Hunger, Hair Changes, Heat Intolerance, Hot  flashes and New Diabetes. Hematology Not Present- Easy Bruising, Excessive bleeding, Gland problems, HIV and Persistent Infections.   Vitals (Dahionnarah Maldonado RMA; 06/22/2014 5:22 PM) 06/22/2014 5:21 PM Weight: 258.4 lb Height: 67in Body Surface Area: 2.35 m Body Mass Index: 40.47 kg/m Temp.: 98.23F(Oral)  Pulse: 79 (Regular)  P.OX: 98% (Room air) BP: 90/70 (Sitting, Left Arm, Standard)    Assessment & Plan Molli Hazard B. Daphine Deutscher MD; 06/22/2014 6:12 PM) OBESITY, MORBID (278.01  E66.01) Impression:  For sleeve gastrectomy Sept 29, 2015

## 2014-07-05 NOTE — Progress Notes (Signed)
Patient alert and oriented, Op day.  Provided support and encouragement.  Encouraged pulmonary toilet, and ambulation.  All questions answered.  Will continue to monitor. 

## 2014-07-05 NOTE — Progress Notes (Signed)
CBC (HGB. AND Hct.) and Serum Creatinine  Drawn by lab.

## 2014-07-05 NOTE — Anesthesia Postprocedure Evaluation (Signed)
  Anesthesia Post-op Note  Patient: [name] Nielsen  Procedure(s) Performed: Procedure(s) (LRB): LAPAROSCOPIC GASTRIC SLEEVE RESECTION with upper endoscopy (N/A)  Patient Location: PACU  Anesthesia Type: General  Level of Consciousness: awake and alert   Airway and Oxygen Therapy: Patient Spontanous Breathing  Post-op Pain: mild  Post-op Assessment: Post-op Vital signs reviewed, Patient's Cardiovascular Status Stable, Respiratory Function Stable, Patent Airway and No signs of Nausea or vomiting  Last Vitals:  Filed Vitals:   07/05/14 0913  BP: 133/81  Pulse: 70  Temp: 36.7 C  Resp: 20    Post-op Vital Signs: stable   Complications: No apparent anesthesia complications

## 2014-07-05 NOTE — Progress Notes (Signed)
Lab called for results- WBC- 11.3- Hgb. -11.3 also- Hct. 34.8- platelets 251,000- serum creatinine- 0.92

## 2014-07-05 NOTE — Op Note (Signed)
Surgeon: Wenda LowMatt Grantley Savage, MD, FACS  Asst:  Ovidio Kinavid Newman, MD, FACS  Anes:  General endotracheal  Procedure: Laparoscopic sleeve gastrectomy and upper endoscopy  Diagnosis: Morbid obesity  Complications: none  EBL:   15 cc  Description of Procedure:  The patient was take to OR 1 and given general anesthesia.  The abdomen was prepped with PCMX and draped sterilely.  A timeout was performed.  Access to the abdomen was achieved with 5 mm Optiview through the left upper quadrant.  No hiatus hernia was seen.  Following insufflation, the state of the abdomen was found to be free of adhesions.  The ViSiGi 36Fr tube was inserted to deflate the stomach and was pulled back into the esophagus.    The pylorus was identified and we measured 5  cm back and marked the antrum.  At that point we began dissection to take down the greater curvature of the stomach using the Harmonic scalpel.  This dissection was taken all the way up to the left crus.  Posterior attachments of the stomach were also taken down.    The ViSiGi tube was then passed into the antrum and suction applied so that it was snug along the lessor curvature.  The "crow's foot" or incisura was identified.  The sleeve gastrectomy was begun using the Lexmark InternationalCovidien platform stapler beginning with a 4.5 black load with TRS followed by a 6 black load with TRS and then purples with TRS.  When the sleeve was complete the tube was taken off suction and insufflated briefly.  The tube was withdrawn.  Upper endoscopy was then performed by Dr. Ezzard StandingNewman.  No bleeding or leaks were noted.  Sleeve had an open tubular uniform appearance endoscopically.     The specimen was extracted through the 15 trocar site.  Wounds were infiltrated with Exparel and closed with 4-0 vicryl.  The 15 mm port was closed with a single 0 vicryl using the endoclose.    Matt B. Daphine DeutscherMartin, MD, [facility], [place] [phone]

## 2014-07-05 NOTE — Op Note (Signed)
Name:  [name] Nielsen MRN: [id number] Date of Surgery: 07/05/2014  Preop Diagnosis:  Morbid Obesity  Postop Diagnosis:  Morbid Obesity (Weight - 257, BMI - 40.37) , S/P Gastric Sleeve  Procedure:  Upper endoscopy  (Intraoperative)  Surgeon:  Ovidio Kinavid Leatrice Parilla, M.D.  Anesthesia:  GET  Indications for procedure: [name] Nielsen is a 38 y.o. female whose primary care physician is Charm BargesSCOTT,CHARLENE S, MD and has completed a Gastric Sleeve today by Dr. Daphine DeutscherMartin.  I am doing an intraoperative upper endoscopy to evaluate the gastric pouch.  Operative Note: The patient is under general anesthesia.  Dr. Daphine DeutscherMartin is laparoscoping the patient while I do an upper endoscopy to evaluate the stomach pouch.  With the patient intubated, I passed the Pentax upper endoscope without difficulty down the esophagus.  The esophago-gastric junction was at 38 cm.    The mucosa of the stomach looked viable and the staple line was intact without bleeding.  I advanced to the pylorus, but did not go through it.  While I insufflated the stomach pouch with air, Dr. Daphine DeutscherMartin  flooded the upper abdomen with saline to put the gastric pouch under saline.  There was no bubbling or evidence of a leak.  There was no evidence of narrowing of the pouch and the gastric sleeve looked tubular.  The scope was then withdrawn.  The esophagus was unremarkable and the patient tolerated the endoscopy without difficulty.  Ovidio Kinavid Jiyah Torpey, MD, [facility] Pager: [phone] Office phone:  [phone]

## 2014-07-05 NOTE — Anesthesia Preprocedure Evaluation (Addendum)
Anesthesia Evaluation  Patient identified by MRN, date of birth, ID band Patient awake    Reviewed: Allergy & Precautions, H&P , NPO status , Patient's Chart, lab work & pertinent test results  Airway Mallampati: II TM Distance: >3 FB Neck ROM: Full    Dental no notable dental hx.    Pulmonary neg pulmonary ROS,  breath sounds clear to auscultation  Pulmonary exam normal       Cardiovascular Exercise Tolerance: Good hypertension, Pt. on medications and Pt. on home beta blockers + Valvular Problems/Murmurs Rhythm:Regular Rate:Normal  Took beta blocker this am   Neuro/Psych  Headaches, PSYCHIATRIC DISORDERS Anxiety Depression    GI/Hepatic negative GI ROS, Neg liver ROS,   Endo/Other  Hypothyroidism Morbid obesity  Renal/GU negative Renal ROS  negative genitourinary   Musculoskeletal negative musculoskeletal ROS (+)   Abdominal   Peds negative pediatric ROS (+)  Hematology  (+) anemia ,   Anesthesia Other Findings   Reproductive/Obstetrics negative OB ROS                          Anesthesia Physical Anesthesia Plan  ASA: III  Anesthesia Plan: General   Post-op Pain Management:    Induction: Intravenous  Airway Management Planned: Oral ETT  Additional Equipment:   Intra-op Plan:   Post-operative Plan: Extubation in OR  Informed Consent: I have reviewed the patients History and Physical, chart, labs and discussed the procedure including the risks, benefits and alternatives for the proposed anesthesia with the patient or authorized representative who has indicated his/her understanding and acceptance.   Dental advisory given  Plan Discussed with: CRNA  Anesthesia Plan Comments:         Anesthesia Quick Evaluation

## 2014-07-05 NOTE — Transfer of Care (Signed)
Immediate Anesthesia Transfer of Care Note  Patient: [name] Nielsen  Procedure(s) Performed: Procedure(s): LAPAROSCOPIC GASTRIC SLEEVE RESECTION with upper endoscopy (N/A)  Patient Location: PACU  Anesthesia Type:General  Level of Consciousness: awake, alert  and oriented  Airway & Oxygen Therapy: Patient Spontanous Breathing and Patient connected to face mask oxygen  Post-op Assessment: Report given to PACU RN and Post -op Vital signs reviewed and stable  Post vital signs: Reviewed and stable  Complications: No apparent anesthesia complications

## 2014-07-06 ENCOUNTER — Inpatient Hospital Stay (HOSPITAL_COMMUNITY): Payer: Managed Care, Other (non HMO)

## 2014-07-06 ENCOUNTER — Encounter (HOSPITAL_COMMUNITY): Payer: Self-pay | Admitting: Surgery

## 2014-07-06 LAB — CBC WITH DIFFERENTIAL/PLATELET
BASOS ABS: 0 10*3/uL (ref 0.0–0.1)
BASOS PCT: 0 % (ref 0–1)
EOS ABS: 0 10*3/uL (ref 0.0–0.7)
Eosinophils Relative: 0 % (ref 0–5)
HCT: 33.2 % — ABNORMAL LOW (ref 36.0–46.0)
Hemoglobin: 10.8 g/dL — ABNORMAL LOW (ref 12.0–15.0)
Lymphocytes Relative: 15 % (ref 12–46)
Lymphs Abs: 1.6 10*3/uL (ref 0.7–4.0)
MCH: 24.3 pg — ABNORMAL LOW (ref 26.0–34.0)
MCHC: 32.5 g/dL (ref 30.0–36.0)
MCV: 74.6 fL — ABNORMAL LOW (ref 78.0–100.0)
Monocytes Absolute: 0.8 10*3/uL (ref 0.1–1.0)
Monocytes Relative: 8 % (ref 3–12)
NEUTROS PCT: 77 % (ref 43–77)
Neutro Abs: 8.2 10*3/uL — ABNORMAL HIGH (ref 1.7–7.7)
PLATELETS: 291 10*3/uL (ref 150–400)
RBC: 4.45 MIL/uL (ref 3.87–5.11)
RDW: 14.4 % (ref 11.5–15.5)
WBC: 10.6 10*3/uL — ABNORMAL HIGH (ref 4.0–10.5)

## 2014-07-06 MED ORDER — IOHEXOL 300 MG/ML  SOLN
50.0000 mL | Freq: Once | INTRAMUSCULAR | Status: AC | PRN
  Administered 2014-07-06: 50 mL via INTRAVENOUS

## 2014-07-06 MED ORDER — PROMETHAZINE HCL 25 MG/ML IJ SOLN
12.5000 mg | Freq: Four times a day (QID) | INTRAMUSCULAR | Status: DC | PRN
  Administered 2014-07-06 (×2): 12.5 mg via INTRAVENOUS
  Filled 2014-07-06 (×2): qty 1

## 2014-07-06 NOTE — Progress Notes (Signed)
Patient ID: [name] Nielsen, female   DOB: [birth year], 38 y.o.   MRN: [id number] [facility] Progress Note:   1 Day Post-Op  Problem List: Patient Active Problem List   Diagnosis Date Noted  . 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 Abnormal liver function test 02/06/2014  . Morbid obesity BMI 40 10/06/2013  . Anemia 07/05/2013  . Headache(784.0) 05/05/2013  . Essential hypertension, benign 05/05/2013  . Hypercholesterolemia 05/05/2013  . Hypothyroidism 05/05/2013   Subjective:  No complaints this am except for nausea Objective: Vital signs in last 24 hours: Temp:  [97.5 F (36.4 C)-99.7 F (37.6 C)] 98.9 F (37.2 C) (09/30 0554) Pulse Rate:  [57-89] 68 (09/30 0554) Resp:  [9-18] 18 (09/30 0554) BP: (103-125)/(56-75) 112/59 mmHg (09/30 0554) SpO2:  [94 %-100 %] 95 % (09/30 0554) Physical Exam: incisions OK.  Minimal pain Lab Results:  Results for orders placed during the hospital encounter of 07/05/14 (from the past 48 hour(s))  PREGNANCY, URINE     Status: None   Collection Time    07/05/14  9:55 AM      Result Value Ref Range   Preg Test, Ur NEGATIVE  NEGATIVE   Comment:            THE SENSITIVITY OF THIS     METHODOLOGY IS >20 mIU/mL.  CBC WITH DIFFERENTIAL     Status: Abnormal   Collection Time    07/05/14  9:56 AM      Result Value Ref Range   WBC 6.2  4.0 - 10.5 K/uL   RBC 5.06  3.87 - 5.11 MIL/uL   Hemoglobin 12.4  12.0 - 15.0 g/dL   HCT 38.0  36.0 - 46.0 %   MCV 75.1 (*) 78.0 - 100.0 fL   MCH 24.5 (*) 26.0 - 34.0 pg   MCHC 32.6  30.0 - 36.0 g/dL   RDW 14.1  11.5 - 15.5 %   Platelets 276  150 - 400 K/uL   Neutrophils Relative % 47  43 - 77 %   Neutro Abs 2.9  1.7 - 7.7 K/uL   Lymphocytes Relative 43  12 - 46 %   Lymphs Abs 2.7  0.7 - 4.0 K/uL   Monocytes Relative 9  3 - 12 %   Monocytes Absolute 0.6  0.1 - 1.0 K/uL   Eosinophils Relative 1  0 - 5 %   Eosinophils Absolute 0.1  0.0 - 0.7 K/uL   Basophils Relative 0  0 - 1 %    Basophils Absolute 0.0  0.0 - 0.1 K/uL  COMPREHENSIVE METABOLIC PANEL     Status: Abnormal   Collection Time    07/05/14  9:56 AM      Result Value Ref Range   Sodium 138  137 - 147 mEq/L   Potassium 3.7  3.7 - 5.3 mEq/L   Chloride 96  96 - 112 mEq/L   CO2 27  19 - 32 mEq/L   Glucose, Bld 98  70 - 99 mg/dL   BUN 19  6 - 23 mg/dL   Creatinine, Ser 0.95  0.50 - 1.10 mg/dL   Calcium 9.9  8.4 - 10.5 mg/dL   Total Protein 8.2  6.0 - 8.3 g/dL   Albumin 4.3  3.5 - 5.2 g/dL   AST 24  0 - 37 U/L   ALT 31  0 - 35 U/L   Alkaline Phosphatase 50  39 - 117 U/L   Total Bilirubin 0.3  0.3 - 1.2 mg/dL   GFR calc non Af Amer 74 (*) >90 mL/min   GFR calc Af Amer 86 (*) >90 mL/min   Comment: (NOTE)     The eGFR has been calculated using the CKD EPI equation.     This calculation has not been validated in all clinical situations.     eGFR's persistently <90 mL/min signify possible Chronic Kidney     Disease.   Anion gap 15  5 - 15  CBC     Status: Abnormal   Collection Time    07/05/14  2:08 PM      Result Value Ref Range   WBC 11.3 (*) 4.0 - 10.5 K/uL   RBC 4.61  3.87 - 5.11 MIL/uL   Hemoglobin 11.3 (*) 12.0 - 15.0 g/dL   HCT 34.8 (*) 36.0 - 46.0 %   MCV 75.5 (*) 78.0 - 100.0 fL   MCH 24.5 (*) 26.0 - 34.0 pg   MCHC 32.5  30.0 - 36.0 g/dL   RDW 14.1  11.5 - 15.5 %   Platelets 251  150 - 400 K/uL  CREATININE, SERUM     Status: Abnormal   Collection Time    07/05/14  2:08 PM      Result Value Ref Range   Creatinine, Ser 0.92  0.50 - 1.10 mg/dL   GFR calc non Af Amer 77 (*) >90 mL/min   GFR calc Af Amer 89 (*) >90 mL/min   Comment: (NOTE)     The eGFR has been calculated using the CKD EPI equation.     This calculation has not been validated in all clinical situations.     eGFR's persistently <90 mL/min signify possible Chronic Kidney     Disease.  GLUCOSE, CAPILLARY     Status: Abnormal   Collection Time    07/05/14 10:05 PM      Result Value Ref Range   Glucose-Capillary 187 (*)  70 - 99 mg/dL  CBC WITH DIFFERENTIAL     Status: Abnormal   Collection Time    07/06/14  5:03 AM      Result Value Ref Range   WBC 10.6 (*) 4.0 - 10.5 K/uL   RBC 4.45  3.87 - 5.11 MIL/uL   Hemoglobin 10.8 (*) 12.0 - 15.0 g/dL   HCT 33.2 (*) 36.0 - 46.0 %   MCV 74.6 (*) 78.0 - 100.0 fL   MCH 24.3 (*) 26.0 - 34.0 pg   MCHC 32.5  30.0 - 36.0 g/dL   RDW 14.4  11.5 - 15.5 %   Platelets 291  150 - 400 K/uL   Neutrophils Relative % 77  43 - 77 %   Lymphocytes Relative 15  12 - 46 %   Monocytes Relative 8  3 - 12 %   Eosinophils Relative 0  0 - 5 %   Basophils Relative 0  0 - 1 %   Neutro Abs 8.2 (*) 1.7 - 7.7 K/uL   Lymphs Abs 1.6  0.7 - 4.0 K/uL   Monocytes Absolute 0.8  0.1 - 1.0 K/uL   Eosinophils Absolute 0.0  0.0 - 0.7 K/uL   Basophils Absolute 0.0  0.0 - 0.1 K/uL   Smear Review MORPHOLOGY UNREMARKABLE     Radiology/Results: No results found. Assessment/Plan: Problem List: Patient Active Problem List   Diagnosis Date Noted  . 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 Abnormal liver function test 02/06/2014  . Morbid obesity BMI 40 10/06/2013  .  Anemia 07/05/2013  . Headache(784.0) 05/05/2013  . Essential hypertension, benign 05/05/2013  . Hypercholesterolemia 05/05/2013  . Hypothyroidism 05/05/2013   Awaiting UGI - if ok will start PD 1 diet 1 Day Post-Op   LOS: 1 day   Matt B. Hassell Done, MD, [facility] [phone] beeper [phone]  07/06/2014 9:51 AM

## 2014-07-06 NOTE — Progress Notes (Signed)
Pt asleep, c/o nausea all day. Provided 2 week post-op diet and will attempt to educate pt in the morning.   Charlott RakesHeather Nachelle Negrette MS, RD, LDN [phone] Pager [phone] Weekend/After Hours Pager

## 2014-07-06 NOTE — Progress Notes (Signed)
Patient refused lab draw stated they could not draw from her hands,and lab staff unable to draw from [facility]. Explained to patient the need for blood draws,but she still refused. Will continue to monitor. C.Lyndall Bellot,RN

## 2014-07-06 NOTE — Progress Notes (Signed)
Patient sleeping, Post op day 1.  Provided support and encouragement, patient complains of nausea.  Encouraged pulmonary toilet, ambulation and small sips of liquids as tolerated.  All questions answered.  Will continue to monitor.

## 2014-07-06 NOTE — Care Management Note (Signed)
    Page 1 of 1   07/06/2014     10:23:18 AM CARE MANAGEMENT NOTE 07/06/2014  Patient:  [name] Nielsen   Account Number:  [id number]  Date Initiated:  07/06/2014  Documentation initiated by:  Lorenda IshiharaPEELE,Marca Gadsby  Subjective/Objective Assessment:   38 yo female admitted s/p sleeve gastrectomy. PTA lived at home with spouse.     Action/Plan:   Home when stable   Anticipated DC Date:  07/06/2014   Anticipated DC Plan:  HOME/SELF CARE      DC Planning Services  CM consult      Choice offered to / List presented to:             Status of service:  Completed, signed off Medicare Important Message given?   (If response is "NO", the following Medicare IM given date fields will be blank) Date Medicare IM given:   Medicare IM given by:   Date Additional Medicare IM given:   Additional Medicare IM given by:    Discharge Disposition:  HOME/SELF CARE  Per UR Regulation:  Reviewed for med. necessity/level of care/duration of stay  If discussed at Long Length of Stay Meetings, dates discussed:    Comments:

## 2014-07-07 ENCOUNTER — Encounter: Payer: Self-pay | Admitting: Internal Medicine

## 2014-07-07 LAB — CBC WITH DIFFERENTIAL/PLATELET
Basophils Absolute: 0 10*3/uL (ref 0.0–0.1)
Basophils Relative: 0 % (ref 0–1)
EOS ABS: 0 10*3/uL (ref 0.0–0.7)
Eosinophils Relative: 0 % (ref 0–5)
HEMATOCRIT: 33.8 % — AB (ref 36.0–46.0)
Hemoglobin: 10.6 g/dL — ABNORMAL LOW (ref 12.0–15.0)
LYMPHS PCT: 22 % (ref 12–46)
Lymphs Abs: 2.1 10*3/uL (ref 0.7–4.0)
MCH: 24.1 pg — ABNORMAL LOW (ref 26.0–34.0)
MCHC: 31.4 g/dL (ref 30.0–36.0)
MCV: 76.8 fL — ABNORMAL LOW (ref 78.0–100.0)
MONO ABS: 0.8 10*3/uL (ref 0.1–1.0)
Monocytes Relative: 8 % (ref 3–12)
NRBC: 3 /100{WBCs} — AB
Neutro Abs: 6.8 10*3/uL (ref 1.7–7.7)
Neutrophils Relative %: 70 % (ref 43–77)
PLATELETS: 261 10*3/uL (ref 150–400)
RBC: 4.4 MIL/uL (ref 3.87–5.11)
RDW: 14.6 % (ref 11.5–15.5)
WBC: 9.7 10*3/uL (ref 4.0–10.5)

## 2014-07-07 MED ORDER — PROMETHAZINE HCL 25 MG/ML IJ SOLN: 12.5000 mg | Freq: Four times a day (QID) | INTRAMUSCULAR | Status: DC | PRN

## 2014-07-07 NOTE — Progress Notes (Signed)
Nutrition Education Note  Received consult for diet education per DROP protocol.   Discussed 2 week post op diet with pt. Emphasized that liquids must be non carbonated, non caffeinated, and sugar free. Fluid goals discussed. Pt to follow up with outpatient bariatric RD for further diet progression after 2 weeks. Multivitamins and minerals also reviewed. Teach back method used, pt expressed understanding, expect good compliance. Pt had not yet purchased bariatric multivitamins and minerals but knew where to get them from and is planning on buying them.    Diet: First 2 Weeks  You will see the nutritionist about two (2) weeks after your surgery. The nutritionist will increase the types of foods you can eat if you are handling liquids well:  If you have severe vomiting or nausea and cannot handle clear liquids lasting longer than 1 day, call your surgeon  Protein Shake  Drink at least 2 ounces of shake 5-6 times per day  Each serving of protein shakes (usually 8 - 12 ounces) should have a minimum of:  15 grams of protein  And no more than 5 grams of carbohydrate  Goal for protein each day:  Men = 80 grams per day  Women = 60 grams per day  Protein powder may be added to fluids such as non-fat milk or Lactaid milk or Soy milk (limit to 35 grams added protein powder per serving)   Hydration  Slowly increase the amount of water and other clear liquids as tolerated (See Acceptable Fluids)  Slowly increase the amount of protein shake as tolerated  Sip fluids slowly and throughout the day  May use sugar substitutes in small amounts (no more than 6 - 8 packets per day; i.e. Splenda)   Fluid Goal  The first goal is to drink at least 8 ounces of protein shake/drink per day (or as directed by the nutritionist); some examples of protein shakes are [employer]Syntrax Nectar, Dillard'sdkins Advantage, EAS Edge HP, and Unjury. See handout from pre-op Bariatric Education Class:  Slowly increase the amount of protein shake  you drink as tolerated  You may find it easier to slowly sip shakes throughout the day  It is important to get your proteins in first  Your fluid goal is to drink 64 - 100 ounces of fluid daily  It may take a few weeks to build up to this  32 oz (or more) should be clear liquids  And  32 oz (or more) should be full liquids (see below for examples)  Liquids should not contain sugar, caffeine, or carbonation   Clear Liquids:  Water or Sugar-free flavored water (i.e. Fruit H2O, Propel)  Decaffeinated coffee or tea (sugar-free)  Crystal Lite, Wyler's Lite, Minute Maid Lite  Sugar-free Jell-O  Bouillon or broth  Sugar-free Popsicle: *Less than 20 calories each; Limit 1 per day   Full Liquids:  Protein Shakes/Drinks + 2 choices per day of other full liquids  Full liquids must be:  No More Than 12 grams of Carbs per serving  No More Than 3 grams of Fat per serving  Strained low-fat cream soup  Non-Fat milk  Fat-free Lactaid Milk  Sugar-free yogurt (Dannon Lite & Fit, Greek yogurt)     Paw PawHeather Tobiah Celestine MS, RD, [place] [phone] Pager [phone] Weekend/After Hours Pager

## 2014-07-07 NOTE — Progress Notes (Signed)
Pt feeling better this evening. No nausea this shift and recently started to pass flatus. Tolerated 2 ounces water q4h today and reported now "feeling hungry". 2 oz chicken soup unjury prepared for pt w/instructions to sip slowly over an hour.

## 2014-07-07 NOTE — Progress Notes (Signed)
General Surgery Note  LOS: 2 days  POD -  2 Days Post-Op  Assessment/Plan: 1.  LAPAROSCOPIC GASTRIC SLEEVE RESECTION with upper endoscopy - 07/05/2014 - Dr. Sheron NightingaleM. Martin  UGI shows some narrowing at incisura (?edema)  Patient unable to keep water down.  Will not advance diet.  Will plan to keep another day.  2.  Anxiety/Depression 3.  HTN 4.  DVT prophylaxis - SQ Heparin  Active Problems:   Status post laparoscopic sleeve gastrectomy  Subjective:  Vomited all sips of water yesterday.  No abdominal pain, just nausea.  Husband in room. Objective:   Filed Vitals:   07/07/14 0527  BP: 130/70  Pulse: 84  Temp: 99.7 F (37.6 C)  Resp: 16     Intake/Output from previous day:  09/30 0701 - 10/01 0700 In: 2460 [P.O.:60; I.V.:2400] Out: 1800 [Urine:1800]  Intake/Output this shift:      Physical Exam:   General: WN obese AA F who is alert and oriented.    HEENT: Normal. Pupils equal. .   Lungs: Clear   Abdomen: Soft.  Has a few BS.   Wound: Clean   Lab Results:    Recent Labs  07/06/14 0503 07/07/14 0500  WBC 10.6* 9.7  HGB 10.8* 10.6*  HCT 33.2* 33.8*  PLT 291 261    BMET   Recent Labs  07/05/14 0956 07/05/14 1408  NA 138  --   K 3.7  --   CL 96  --   CO2 27  --   GLUCOSE 98  --   BUN 19  --   CREATININE 0.95 0.92  CALCIUM 9.9  --     PT/INR  No results found for this basename: LABPROT, INR,  in the last 72 hours  ABG  No results found for this basename: PHART, PCO2, PO2, HCO3,  in the last 72 hours   Studies/Results:  Dg Ugi W/water Sol Cm  07/06/2014   CLINICAL DATA:  Postop for gastric sleeve yesterday.  EXAM: WATER SOLUBLE UPPER GI SERIES  TECHNIQUE: Single-column upper GI series was performed using water soluble contrast.  CONTRAST:  40mL OMNIPAQUE IOHEXOL 300 MG/ML  SOLN  COMPARISON:  11/02/2013  FLUOROSCOPY TIME:  3 min and 28 seconds  FINDINGS: Preprocedure scout film demonstrates a nonobstructive bowel gas pattern. Cholecystectomy clips. Surgical  sutures in the left upper abdomen. Moderate amount of stool.  Focused single-contrast exam. Patient could only tolerate 40 cc of Omnipaque.  Expected narrowing of the proximal and mid gastric lumen. There is likely mild postoperative edema in this area, causing further narrowing. Relative preservation of the gastric antral lumen. No obstruction or contrast extravasation to suggest perforation. Normal filling of proximal small bowel loops.  IMPRESSION: Expected appearance after sleeve gastrectomy.   Electronically Signed   By: Jeronimo GreavesKyle  Talbot M.D.   On: 07/06/2014 10:35     Anti-infectives:   Anti-infectives   Start     Dose/Rate Route Frequency Ordered Stop   07/05/14 0914  cefOXitin (MEFOXIN) 2 g in dextrose 5 % 50 mL IVPB     2 g 100 mL/hr over 30 Minutes Intravenous On call to O.R. 07/05/14 0914 07/05/14 1155      Ovidio Kinavid Natan Hartog, MD, FACS Pager: [phone] [facility] Office: [phone] 07/07/2014

## 2014-07-08 ENCOUNTER — Ambulatory Visit: Payer: Managed Care, Other (non HMO) | Admitting: Internal Medicine

## 2014-07-08 NOTE — Discharge Instructions (Signed)
[facility] - DISCHARGE INSTRUCTIONS TO PATIENT  Return to work on:  2 to 4 weeks.  Will discuss with Dr. Daphine Deutscher  Activity:  Driving - May drive in 2 or 3 days, if doing well   Lifting - Take it easy for 1 week, then no limits  Wound Care:   May shower  Diet:  Post sleeve gastrectomy diet  Follow up appointment:  Call Dr. Ermalene Searing office [facility]) at [phone] for an appointment in 2 weeks  Medications and dosages:  Resume your home medications.  You have a prescription for:  Oxycodone elixir  Call Dr. Daphine Deutscher or his office  [phone]) if you have:  Temperature greater than 100.4,  Persistent nausea and vomiting,  Severe uncontrolled pain,  Redness, tenderness, or signs of infection (pain, swelling, redness, odor or green/yellow discharge around the site),  Difficulty breathing, headache or visual disturbances,  Any other questions or concerns you may have after discharge.  In an emergency, call 911 or go to an Emergency Department at a nearby hospital.       GASTRIC BYPASS/SLEEVE  Home Care Instructions   These instructions are to help you care for yourself when you go home.  Call: If you have any problems.   Call [phone] and ask for the surgeon on call   If you need immediate assistance come to the ER at [facility]. Tell the ER staff you are a new post-op gastric bypass or gastric sleeve patient  Signs and symptoms to report:   Severe  vomiting or nausea o If you cannot handle clear liquids for longer than 1 day, call your surgeon   Abdominal pain which does not get better after taking your pain medication   Fever greater than 100.4  F and chills   Heart rate over 100 beats a minute   Trouble breathing   Chest pain   Redness,  swelling, drainage, or foul odor at incision (surgical) sites   If your incisions open or pull apart   Swelling or pain in calf (lower leg)   Diarrhea (Loose bowel movements that happen often),  frequent watery, uncontrolled bowel movements   Constipation, (no bowel movements for 3 days) if this happens: o Take Milk of Magnesia, 2 tablespoons by mouth, 3 times a day for 2 days if needed o Stop taking Milk of Magnesia once you have had a bowel movement o Call your doctor if constipation continues Or o Take Miralax  (instead of Milk of Magnesia) following the label instructions o Stop taking Miralax once you have had a bowel movement o Call your doctor if constipation continues   Anything you think is abnormal for you   Normal side effects after surgery:   Unable to sleep at night or unable to concentrate   Irritability   Being tearful (crying) or depressed  These are common complaints, possibly related to your anesthesia, stress of surgery, and change in lifestyle, that usually go away a few weeks after surgery. If these feelings continue, call your medical doctor.  Wound Care: You may have surgical glue, steri-strips, or staples over your incisions after surgery   Surgical glue: Looks like clear film over your incisions and will wear off a little at a time   Steri-strips: Adhesive strips of tape over your incisions. You may notice a yellowish color on skin under the steri-strips. This is used to make the steri-strips stick better. Do not pull the steri-strips off - let them fall off  Staples: Cherlynn Polo may be removed before you leave the hospital o If you go home with staples, call [facility] for an appointment with your surgeons nurse to have staples removed 10 days after surgery, [phone]   Showering: You may shower two (2) days after your surgery unless your surgeon tells you differently o Wash gently around incisions with warm soapy water, rinse well, and gently pat dry o If you have a drain (tube from your incision), you may need someone to hold this while you shower o No tub baths until staples are removed and incisions are healed   Medications:    Medications should be liquid or crushed if larger than the size of a dime   Extended release pills (medication that releases a little bit at a time through the  day) should not be crushed   Depending on the size and number of medications you take, you may need to space (take a few throughout the day)/change the time you take your medications so that you do not over-fill your pouch (smaller stomach)   Make sure you follow-up with you primary care physician to make medication changes needed during rapid weight loss and life -style changes   If you have diabetes, follow up with your doctor that orders your diabetes medication(s) within one week after surgery and check your blood sugar regularly    Do not drive while taking narcotics (pain medications)    Do not take acetaminophen (Tylenol) and Roxicet or Lortab Elixir at the same time since these pain medications contain acetaminophen   Diet:  First 2 Weeks You will see the nutritionist about two (2) weeks after your surgery. The nutritionist will increase the types of foods you can eat if you are handling liquids well:   If you have severe vomiting or nausea and cannot handle clear liquids lasting longer than 1 day call your surgeon Protein Shake   Drink at least 2 ounces of shake 5-6 times per day   Each serving of protein shakes (usually 8-12 ounces) should have a minimum of: o 15 grams of protein o And no more than 5 grams of carbohydrate   Goal for protein each day: o Men = 80 grams per day o Women = 60 grams per day      Protein powder may be added to fluids such as non-fat milk or Lactaid milk or Soy milk (limit to 35 grams added protein powder per serving)  Hydration   Slowly increase the amount of water and other clear liquids as tolerated (See Acceptable Fluids)   Slowly increase the amount of protein shake as tolerated   Sip fluids slowly and throughout the day   May use sugar substitutes in small amounts (no more than 6-8  packets per day; i.e. Splenda)  Fluid Goal   The first goal is to drink at least 8 ounces of protein shake/drink per day (or as directed by the nutritionist); some examples of protein shakes are [employer], [employer]illard[employer], EAS Edge HP, and Unjury. - See handout from pre-op Bariatric Education Class: o Slowly increase the amount of protein shake you drink as tolerated o You may find it easier to slowly sip shakes throughout the day o It is important to get your proteins in first   Your fluid goal is to drink 64-100 ounces of fluid daily o It may take a few weeks to build up to this    32 oz. (or more) should be clear  liquids And   32 oz. (or more) should be full liquids (see below for examples)   Liquids should not contain sugar, caffeine, or carbonation  Clear Liquids:   Water of Sugar-free flavored water (i.e. Fruit HO, Propel)   Decaffeinated coffee or tea (sugar-free)   Crystal lite, Wylers Lite, Minute Maid Lite   Sugar-free Jell-O   Bouillon or broth   Sugar-free Popsicle:    - Less than 20 calories each; Limit 1 per day  Full Liquids:                   Protein Shakes/Drinks + 2 choices per day of other full liquids   Full liquids must be: o No More Than 12 grams of Carbs per serving o No More Than 3 grams of Fat per serving   Strained low-fat cream soup   Non-Fat milk   Fat-free Lactaid Milk   Sugar-free yogurt (Dannon Lite & Fit, Greek yogurt)    Vitamins and Minerals   Start 1 day after surgery unless otherwise directed by your surgeon   2 Chewable Multivitamin / Multimineral Supplement with iron (i.e. Centrum for Adults)   Vitamin B-12, 350-500 micrograms sub-lingual (place tablet under the tongue) each day   Chewable Calcium Citrate with Vitamin D-3 (Example: 3 Chewable Calcium  Plus 600 with Vitamin D-3) o Take 500 mg three (3) times a day for a total of 1500 mg each day o Do not take all 3 doses of calcium at one time as it may cause constipation, and you  can only absorb 500 mg at a time o Do not mix multivitamins containing iron with calcium supplements;  take 2 hours apart o Do not substitute Tums (calcium carbonate) for your calcium   Menstruating women and those at risk for anemia ( a blood disease that causes weakness) may need extra iron o Talk to your doctor to see if you need more iron   If you need extra iron: Total daily Iron recommendation (including Vitamins) is 50 to 100 mg Iron/day   Do not stop taking or change any vitamins or minerals until you talk to your nutritionist or surgeon   Your nutritionist and/or surgeon must approve all vitamin and mineral supplements   Activity and Exercise: It is important to continue walking at home. Limit your physical activity as instructed by your doctor. During this time, use these guidelines:   Do not lift anything greater than ten  (10) pounds for at least two (2) weeks   Do not go back to work or drive until Designer, industrial/product says you can   You may have sex when you feel comfortable o It is VERY important for female patients to use a reliable birth control method; fertility often increase after surgery o Do not get pregnant for at least 18 months   Start exercising as soon as your doctor tells you that you can o Make sure your doctor approves any physical activity   Start with a simple walking program   Walk 5-15 minutes each day, 7 days per week   Slowly increase until you are walking 30-45 minutes per day   Consider joining our BELT program. [phone] or email [email or web address]   Special Instructions Things to remember:   Free counseling is available for you and your family through collaboration between [facility] and [facility]. Please call [phone] and leave a message   Use your CPAP when sleeping if this applies to you   Consider  buying a medical alert bracelet that says you had lap-band surgery     You will likely have your first fill (fluid added to your band) 6 - 8 weeks after  surgery   [facility] has a free Bariatric Surgery Support Group that meets monthly, the 3rd Thursday, 6pm. [facility] Classrooms. You can see classes online at [email or web address]   It is very important to keep all follow up appointments with your surgeon, nutritionist, primary care physician, and behavioral health practitioner o After the first year, please follow up with your bariatric surgeon and nutritionist at least once a year in order to maintain best weight loss results                    [facility]:  [phone]               [facility]: [phone]               Bariatric Nurse Coordinator: [phone]  Gastric Bypass/Sleeve Home Care Instructions  Rev. 11/2012                                                         Reviewed and Endorsed                                                    by [facility] Patient Education Committee, Jan, 2014

## 2014-07-08 NOTE — Progress Notes (Signed)
Patient alert and oriented, pain is controlled. Patient is tolerating fluids,  advanced to protein shake today, patient tolerated well.  Reviewed Gastric sleeve discharge instructions with patient and patient is able to articulate understanding.  Provided information on BELT program, Support Group and [facility] outpatient pharmacy. All questions answered, will continue to monitor.  

## 2014-07-08 NOTE — Discharge Summary (Signed)
Reviewed MD discharge instructions including medications, precautions, follow-up appointments, and diet.  Reinforced Bariatric Nurse Educator education. Pt/family had no questions and verbalized understanding of all instructions/teaching.  Pt being d/c into care of family.

## 2014-07-08 NOTE — Discharge Summary (Signed)
Physician Discharge Summary  Patient ID:  [name] Nielsen  MRN: [id number]  DOB/AGE: 1975/11/05 38 y.o.  Admit date: 07/05/2014 Discharge date: 07/08/2014  Discharge Diagnoses:  1.  Morbid obesity 2.  Hypertension 3.  Thyroid replacement   Active Problems:   Status post laparoscopic sleeve gastrectomy  Operation: Procedure(s): LAPAROSCOPIC GASTRIC SLEEVE RESECTION with upper endoscopy on 07/05/2014 - Dr. Sheron Nightingale  Discharged Condition: good  Hospital Course: [name] Nielsen is an 38 y.o. female whose primary care physician is Charm Barges, MD and who was admitted 07/05/2014 with a chief complaint of morbid obesity.   She was brought to the operating room on 07/05/2014 and underwent LAPAROSCOPIC GASTRIC SLEEVE RESECTION.  Her swallow the first post op day showed some narrowing at the incisura.  She had trouble tolerating water. So I waited to advance her diet until the third post op day. She is doing better.  The narcotics make her feel funny, so she is going to try tylenol for pain. Her husband is in the room with her. She is ready to go home.  The discharge instructions were reviewed with the patient.  Consults: None  Significant Diagnostic Studies: Results for orders placed during the hospital encounter of 07/05/14  CBC WITH DIFFERENTIAL      Result Value Ref Range   WBC 6.2  4.0 - 10.5 K/uL   RBC 5.06  3.87 - 5.11 MIL/uL   Hemoglobin 12.4  12.0 - 15.0 g/dL   HCT 40.9  81.1 - 91.4 %   MCV 75.1 (*) 78.0 - 100.0 fL   MCH 24.5 (*) 26.0 - 34.0 pg   MCHC 32.6  30.0 - 36.0 g/dL   RDW 78.2  95.6 - 21.3 %   Platelets 276  150 - 400 K/uL   Neutrophils Relative % 47  43 - 77 %   Neutro Abs 2.9  1.7 - 7.7 K/uL   Lymphocytes Relative 43  12 - 46 %   Lymphs Abs 2.7  0.7 - 4.0 K/uL   Monocytes Relative 9  3 - 12 %   Monocytes Absolute 0.6  0.1 - 1.0 K/uL   Eosinophils Relative 1  0 - 5 %   Eosinophils Absolute 0.1  0.0 - 0.7 K/uL   Basophils Relative 0  0 - 1 %   Basophils  Absolute 0.0  0.0 - 0.1 K/uL  COMPREHENSIVE METABOLIC PANEL      Result Value Ref Range   Sodium 138  137 - 147 mEq/L   Potassium 3.7  3.7 - 5.3 mEq/L   Chloride 96  96 - 112 mEq/L   CO2 27  19 - 32 mEq/L   Glucose, Bld 98  70 - 99 mg/dL   BUN 19  6 - 23 mg/dL   Creatinine, Ser 0.86  0.50 - 1.10 mg/dL   Calcium 9.9  8.4 - 57.8 mg/dL   Total Protein 8.2  6.0 - 8.3 g/dL   Albumin 4.3  3.5 - 5.2 g/dL   AST 24  0 - 37 U/L   ALT 31  0 - 35 U/L   Alkaline Phosphatase 50  39 - 117 U/L   Total Bilirubin 0.3  0.3 - 1.2 mg/dL   GFR calc non Af Amer 74 (*) >90 mL/min   GFR calc Af Amer 86 (*) >90 mL/min   Anion gap 15  5 - 15  PREGNANCY, URINE      Result Value Ref Range   Preg Test, Ur NEGATIVE  NEGATIVE  CBC      Result Value Ref Range   WBC 11.3 (*) 4.0 - 10.5 K/uL   RBC 4.61  3.87 - 5.11 MIL/uL   Hemoglobin 11.3 (*) 12.0 - 15.0 g/dL   HCT 04.5 (*) 40.9 - 81.1 %   MCV 75.5 (*) 78.0 - 100.0 fL   MCH 24.5 (*) 26.0 - 34.0 pg   MCHC 32.5  30.0 - 36.0 g/dL   RDW 91.4  78.2 - 95.6 %   Platelets 251  150 - 400 K/uL  CREATININE, SERUM      Result Value Ref Range   Creatinine, Ser 0.92  0.50 - 1.10 mg/dL   GFR calc non Af Amer 77 (*) >90 mL/min   GFR calc Af Amer 89 (*) >90 mL/min  CBC WITH DIFFERENTIAL      Result Value Ref Range   WBC 10.6 (*) 4.0 - 10.5 K/uL   RBC 4.45  3.87 - 5.11 MIL/uL   Hemoglobin 10.8 (*) 12.0 - 15.0 g/dL   HCT 21.3 (*) 08.6 - 57.8 %   MCV 74.6 (*) 78.0 - 100.0 fL   MCH 24.3 (*) 26.0 - 34.0 pg   MCHC 32.5  30.0 - 36.0 g/dL   RDW 46.9  62.9 - 52.8 %   Platelets 291  150 - 400 K/uL   Neutrophils Relative % 77  43 - 77 %   Lymphocytes Relative 15  12 - 46 %   Monocytes Relative 8  3 - 12 %   Eosinophils Relative 0  0 - 5 %   Basophils Relative 0  0 - 1 %   Neutro Abs 8.2 (*) 1.7 - 7.7 K/uL   Lymphs Abs 1.6  0.7 - 4.0 K/uL   Monocytes Absolute 0.8  0.1 - 1.0 K/uL   Eosinophils Absolute 0.0  0.0 - 0.7 K/uL   Basophils Absolute 0.0  0.0 - 0.1 K/uL   Smear  Review MORPHOLOGY UNREMARKABLE    GLUCOSE, CAPILLARY      Result Value Ref Range   Glucose-Capillary 187 (*) 70 - 99 mg/dL  CBC WITH DIFFERENTIAL      Result Value Ref Range   WBC 9.7  4.0 - 10.5 K/uL   RBC 4.40  3.87 - 5.11 MIL/uL   Hemoglobin 10.6 (*) 12.0 - 15.0 g/dL   HCT 41.3 (*) 24.4 - 01.0 %   MCV 76.8 (*) 78.0 - 100.0 fL   MCH 24.1 (*) 26.0 - 34.0 pg   MCHC 31.4  30.0 - 36.0 g/dL   RDW 27.2  53.6 - 64.4 %   Platelets 261  150 - 400 K/uL   Neutrophils Relative % 70  43 - 77 %   Lymphocytes Relative 22  12 - 46 %   Monocytes Relative 8  3 - 12 %   Eosinophils Relative 0  0 - 5 %   Basophils Relative 0  0 - 1 %   nRBC 3 (*) 0 /100 WBC   Neutro Abs 6.8  1.7 - 7.7 K/uL   Lymphs Abs 2.1  0.7 - 4.0 K/uL   Monocytes Absolute 0.8  0.1 - 1.0 K/uL   Eosinophils Absolute 0.0  0.0 - 0.7 K/uL   Basophils Absolute 0.0  0.0 - 0.1 K/uL   RBC Morphology RARE NRBCs      Dg Ugi W/water Sol Cm  07/06/2014   CLINICAL DATA:  Postop for gastric sleeve yesterday.  EXAM: WATER SOLUBLE UPPER GI SERIES  TECHNIQUE: Single-column  upper GI series was performed using water soluble contrast.  CONTRAST:  40mL OMNIPAQUE IOHEXOL 300 MG/ML  SOLN  COMPARISON:  11/02/2013  FLUOROSCOPY TIME:  3 min and 28 seconds  FINDINGS: Preprocedure scout film demonstrates a nonobstructive bowel gas pattern. Cholecystectomy clips. Surgical sutures in the left upper abdomen. Moderate amount of stool.  Focused single-contrast exam. Patient could only tolerate 40 cc of Omnipaque.  Expected narrowing of the proximal and mid gastric lumen. There is likely mild postoperative edema in this area, causing further narrowing. Relative preservation of the gastric antral lumen. No obstruction or contrast extravasation to suggest perforation. Normal filling of proximal small bowel loops.  IMPRESSION: Expected appearance after sleeve gastrectomy.   Electronically Signed   By: Jeronimo GreavesKyle  Talbot M.D.   On: 07/06/2014 10:35    Discharge  Exam:  Filed Vitals:   07/08/14 0552  BP: 134/82  Pulse: 86  Temp: 99.2 F (37.3 C)  Resp: 18    General: WN obese F who is alert.  Lungs: Clear to auscultation and symmetric breath sounds. Heart:  RRR. No murmur or rub. Abdomen: Soft.  Normal bowel sounds. Wounds look good.  Discharge Medications:     Medication List    STOP taking these medications       ibuprofen 200 MG tablet  Commonly known as:  ADVIL,MOTRIN      TAKE these medications       acetaminophen 500 MG tablet  Commonly known as:  TYLENOL  Take 1,000 mg by mouth every 6 (six) hours as needed for mild pain or moderate pain.     HYDROcodone-acetaminophen 7.5-325 mg/15 ml solution  Commonly known as:  HYCET  Take 15 mLs by mouth every 4 (four) hours as needed for moderate pain.     levothyroxine 175 MCG tablet  Commonly known as:  SYNTHROID, LEVOTHROID  Take 175 mcg by mouth daily before breakfast.     lisinopril-hydrochlorothiazide 20-25 MG per tablet  Commonly known as:  PRINZIDE,ZESTORETIC  Take 1 tablet by mouth every morning.     metoprolol succinate 50 MG 24 hr tablet  Commonly known as:  TOPROL-XL  Take 50 mg by mouth every morning. Take with or immediately following a meal.        Disposition: Final discharge disposition not confirmed      Discharge Instructions   Increase activity slowly    Complete by:  As directed            Follow-up Information   Follow up with Valarie MerinoMARTIN,MATTHEW B, MD.   Specialty:  General Surgery   Contact information:   [address] [place] [address] [phone]      Return to work on:  2 to 4 weeks.  Will discuss with Dr. Daphine DeutscherMartin  Activity:  Driving - May drive in 2 or 3 days, if doing well   Lifting - Take it easy for 1 week, then no limits  Wound Care:   May shower  Diet:  Post sleeve gastrectomy diet  Follow up appointment:  Call Dr. Ermalene SearingMartin's office [facility]) at [phone] for an appointment in 2  weeks  Medications and dosages:  Resume your home medications.  You have a prescription for:  Oxycodone elixir  Signed: Ovidio Kinavid Shaiden Aldous, M.D., [facility] Office:  [phone]  07/08/2014, 7:25 AM

## 2014-07-19 ENCOUNTER — Other Ambulatory Visit: Payer: Self-pay | Admitting: Internal Medicine

## 2014-07-19 ENCOUNTER — Encounter: Payer: Managed Care, Other (non HMO) | Attending: Surgery

## 2014-07-19 DIAGNOSIS — Z6834 Body mass index (BMI) 34.0-34.9, adult: Secondary | ICD-10-CM | POA: Diagnosis not present

## 2014-07-19 DIAGNOSIS — Z713 Dietary counseling and surveillance: Secondary | ICD-10-CM | POA: Insufficient documentation

## 2014-07-19 NOTE — Progress Notes (Signed)
Bariatric Class:  Appt start time: 1530 end time:  1630.  2 Week Post-Operative Nutrition Class  Patient was seen on 07/19/2014 for Post-Operative Nutrition education at the [facility].   Surgery date: 07/05/14 Surgery type: gastric sleeve Start weight at [facility]: 262.5 lbs on 11/13/2013 Weight today: 236.0 lbs  Weight change: 23.5 lbs Total weight loss: 26.5 lbs  TANITA  BODY COMP RESULTS  06/20/14 07/19/14   BMI (kg/m^2) 40.6 37.0   Fat Mass (lbs) 129 111.0   Fat Free Mass (lbs) 130.5 125.0   Total Body Water (lbs) 95.5 91.5    The following the learning objectives were met by the patient during this course:  Identifies Phase 3A (Soft, High Proteins) Dietary Goals and will begin from 2 weeks post-operatively to 2 months post-operatively  Identifies appropriate sources of fluids and proteins   States protein recommendations and appropriate sources post-operatively  Identifies the need for appropriate texture modifications, mastication, and bite sizes when consuming solids  Identifies appropriate multivitamin and calcium sources post-operatively  Describes the need for physical activity post-operatively and will follow MD recommendations  States when to call healthcare provider regarding medication questions or post-operative complications  Handouts given during class include:  Phase 3A: Soft, High Protein Diet Handout  Follow-Up Plan: Patient will follow-up at [facility] in 6 weeks for 2 month post-op nutrition visit for diet advancement per MD.

## 2014-07-19 NOTE — Patient Instructions (Signed)
Patient to follow Phase 3A-Soft, High Protein Diet and follow-up at [facility] in 6 weeks for 2 months post-op nutrition visit for diet advancement. 

## 2014-07-27 ENCOUNTER — Encounter: Payer: Self-pay | Admitting: Internal Medicine

## 2014-08-15 ENCOUNTER — Encounter: Payer: Self-pay | Admitting: Internal Medicine

## 2014-08-15 ENCOUNTER — Ambulatory Visit (INDEPENDENT_AMBULATORY_CARE_PROVIDER_SITE_OTHER): Payer: Managed Care, Other (non HMO) | Admitting: Internal Medicine

## 2014-08-15 VITALS — BP 110/70 | HR 91 | Temp 98.1°F | Ht 67.0 in | Wt 227.2 lb

## 2014-08-15 DIAGNOSIS — E7800 Pure hypercholesterolemia, unspecified: Secondary | ICD-10-CM

## 2014-08-15 DIAGNOSIS — E039 Hypothyroidism, unspecified: Secondary | ICD-10-CM

## 2014-08-15 DIAGNOSIS — R945 Abnormal results of liver function studies: Secondary | ICD-10-CM

## 2014-08-15 DIAGNOSIS — R7989 Other specified abnormal findings of blood chemistry: Secondary | ICD-10-CM

## 2014-08-15 DIAGNOSIS — E669 Obesity, unspecified: Secondary | ICD-10-CM

## 2014-08-15 DIAGNOSIS — Z9884 Bariatric surgery status: Secondary | ICD-10-CM

## 2014-08-15 DIAGNOSIS — D649 Anemia, unspecified: Secondary | ICD-10-CM

## 2014-08-15 DIAGNOSIS — I10 Essential (primary) hypertension: Secondary | ICD-10-CM

## 2014-08-15 NOTE — Progress Notes (Signed)
Pre visit review using our clinic review tool, if applicable. No additional management support is needed unless otherwise documented below in the visit note. 

## 2014-08-16 ENCOUNTER — Encounter: Payer: Self-pay | Admitting: Internal Medicine

## 2014-08-16 NOTE — Progress Notes (Signed)
Subjective:    Patient ID: [name] Nielsen, female    DOB: [birth year], 38 y.o.   MRN: [id number]  HPI 38 year old female with past history of hypertension, hypercholesterolemia and hypothyroidism who comes in today for a scheduled follow up.  She is s/p laparoscopic sleeve gastrectomy.  She has lost 36 pounds.  6 weeks post surgery.   She is dong well.  Eating proteins.  Has a f/u with the nutritionist in two weeks.  Feels better.  Is exercising.  Walking.  Some constipation.  Taking MOM.  Working.  We discussed taking miralax daily.  No nausea or vomiting.  No abdominal pain or cramping.        Past Medical History  Diagnosis Date  . Depression   . Hypertension   . Hypercholesterolemia   . Frequent headaches     hx of  . Heart murmur   . Hypothyroidism   . Hx: UTI (urinary tract infection)     child, s/p urinary procecure  . Anemia     hx of     Current Outpatient Prescriptions on File Prior to Visit  Medication Sig Dispense Refill  . acetaminophen (TYLENOL) 500 MG tablet Take 1,000 mg by mouth every 6 (six) hours as needed for mild pain or moderate pain.    Marland Kitchen. levothyroxine (SYNTHROID, LEVOTHROID) 175 MCG tablet TAKE ONE TABLET BY MOUTH ONCE DAILY BEFORE BREAKFAST 30 tablet 0  . lisinopril-hydrochlorothiazide (PRINZIDE,ZESTORETIC) 20-25 MG per tablet Take 1 tablet by mouth every morning.     No current facility-administered medications on file prior to visit.    Review of Systems no headaches reported.  No lightheadedness or dizziness.  No sinus or allergy symptoms.  No chest pain, tightness or palpitations.  No increased shortness of breath, cough or congestion.  No nausea or vomiting.  No acid reflux.  No abdominal pain or cramping.  Constipation as outlined.  No urine change.  Has lost weight.  Is s/p gastric sleeve surgery.        Objective:   Physical Exam  Filed Vitals:   08/15/14 0929  BP: 110/70  Pulse: 91  Temp: 98.1 F (4936.217 C)   [age] year old female in no acute  distress.   HEENT:  Nares- clear.  Oropharynx - without lesions. NECK:  Supple.  Nontender.  No audible bruit.  HEART:  Appears to be regular. LUNGS:  No crackles or wheezing audible.  Respirations even and unlabored.  RADIAL PULSE:  Equal bilaterally.   ABDOMEN:  Soft, nontender.  Bowel sounds present and normal.  No audible abdominal bruit.   EXTREMITIES:  No increased edema present.  DP pulses palpable and equal bilaterally.          Assessment & Plan:  1. Essential hypertension, benign Blood pressure doing well.  Follow.  - Basic metabolic panel; Future  2. Hypothyroidism, unspecified hypothyroidism type On thyroid replacement.  Check tsh.   3. Hypercholesterolemia Low cholesterol diet.  She has lost weight.  - Lipid panel; Future  4. Anemia, unspecified anemia type Noted after surgery.  Recheck cbc.   - CBC with Differential; Future - Ferritin; Future - IBC panel; Future - Vitamin B12; Future  5. Abnormal liver function test Wit the weight loss, will recheck liver panel to see if improved.  - Hepatic function panel; Future  6. Status post laparoscopic sleeve gastrectomy Doing well.  Continue to f/u with surgery.  Due to see her nutritionist in two weeks.    7.  Obesity (BMI 30-39.9) Losing weight.  Exercising.  Follow.   HEALTH MAINTENANCE.  Physical 07/05/13.   Mammogram 07/22/13 negative.   Had colonoscopy a couple of years ago.

## 2014-08-20 ENCOUNTER — Other Ambulatory Visit: Payer: Self-pay | Admitting: Internal Medicine

## 2014-08-24 ENCOUNTER — Encounter: Payer: Self-pay | Admitting: Internal Medicine

## 2014-08-25 ENCOUNTER — Encounter: Payer: Self-pay | Admitting: Internal Medicine

## 2014-08-25 ENCOUNTER — Other Ambulatory Visit: Payer: Managed Care, Other (non HMO)

## 2014-08-25 ENCOUNTER — Ambulatory Visit (INDEPENDENT_AMBULATORY_CARE_PROVIDER_SITE_OTHER): Payer: Managed Care, Other (non HMO) | Admitting: Internal Medicine

## 2014-08-25 VITALS — BP 110/60 | HR 80 | Temp 98.0°F | Ht 67.0 in | Wt 226.5 lb

## 2014-08-25 DIAGNOSIS — D649 Anemia, unspecified: Secondary | ICD-10-CM | POA: Diagnosis not present

## 2014-08-25 DIAGNOSIS — R7989 Other specified abnormal findings of blood chemistry: Secondary | ICD-10-CM

## 2014-08-25 DIAGNOSIS — R945 Abnormal results of liver function studies: Secondary | ICD-10-CM

## 2014-08-25 DIAGNOSIS — I10 Essential (primary) hypertension: Secondary | ICD-10-CM

## 2014-08-25 DIAGNOSIS — N3000 Acute cystitis without hematuria: Secondary | ICD-10-CM

## 2014-08-25 DIAGNOSIS — E7800 Pure hypercholesterolemia, unspecified: Secondary | ICD-10-CM

## 2014-08-25 DIAGNOSIS — R319 Hematuria, unspecified: Secondary | ICD-10-CM

## 2014-08-25 DIAGNOSIS — N390 Urinary tract infection, site not specified: Secondary | ICD-10-CM

## 2014-08-25 LAB — FERRITIN: Ferritin: 38.1 ng/mL (ref 10.0–291.0)

## 2014-08-25 LAB — POCT URINALYSIS DIPSTICK
BILIRUBIN UA: NEGATIVE
Glucose, UA: NEGATIVE
Ketones, UA: NEGATIVE
NITRITE UA: NEGATIVE
Protein, UA: NEGATIVE
Spec Grav, UA: <=1.005
Urobilinogen, UA: 1
pH, UA: 6.5

## 2014-08-25 LAB — BASIC METABOLIC PANEL
BUN: 10 mg/dL (ref 6–23)
CO2: 26 mEq/L (ref 19–32)
Calcium: 9.2 mg/dL (ref 8.4–10.5)
Chloride: 101 mEq/L (ref 96–112)
Creatinine, Ser: 0.9 mg/dL (ref 0.4–1.2)
GFR: 74.35 mL/min (ref >60.00)
GLUCOSE: 91 mg/dL (ref 70–99)
Potassium: 3.8 mEq/L (ref 3.5–5.1)
Sodium: 136 mEq/L (ref 135–145)

## 2014-08-25 LAB — LIPID PANEL
Cholesterol: 209 mg/dL — ABNORMAL HIGH (ref 0–200)
HDL: 44.3 mg/dL (ref >39.00)
LDL Cholesterol: 144 mg/dL — ABNORMAL HIGH (ref 0–99)
NonHDL: 164.7
Total CHOL/HDL Ratio: 5
Triglycerides: 106 mg/dL (ref 0.0–149.0)
VLDL: 21.2 mg/dL (ref 0.0–40.0)

## 2014-08-25 LAB — HEPATIC FUNCTION PANEL
ALT: 21 U/L (ref 0–35)
AST: 18 U/L (ref 0–37)
Albumin: 3.9 g/dL (ref 3.5–5.2)
Alkaline Phosphatase: 48 U/L (ref 39–117)
BILIRUBIN TOTAL: 0.7 mg/dL (ref 0.2–1.2)
Bilirubin, Direct: 0.1 mg/dL (ref 0.0–0.3)
Total Protein: 6.8 g/dL (ref 6.0–8.3)

## 2014-08-25 LAB — CBC WITH DIFFERENTIAL/PLATELET
Basophils Absolute: 0 10*3/uL (ref 0.0–0.1)
Basophils Relative: 0.6 % (ref 0.0–3.0)
Eosinophils Absolute: 0.1 10*3/uL (ref 0.0–0.7)
Eosinophils Relative: 1.9 % (ref 0.0–5.0)
HCT: 35.7 % — ABNORMAL LOW (ref 36.0–46.0)
Hemoglobin: 11.6 g/dL — ABNORMAL LOW (ref 12.0–15.0)
Lymphocytes Relative: 36.2 % (ref 12.0–46.0)
Lymphs Abs: 2 10*3/uL (ref 0.7–4.0)
MCHC: 32.5 g/dL (ref 30.0–36.0)
MCV: 76.1 fl — AB (ref 78.0–100.0)
MONO ABS: 0.5 10*3/uL (ref 0.1–1.0)
Monocytes Relative: 9.2 % (ref 3.0–12.0)
Neutro Abs: 2.9 10*3/uL (ref 1.4–7.7)
Neutrophils Relative %: 52.1 % (ref 43.0–77.0)
PLATELETS: 260 10*3/uL (ref 150.0–400.0)
RBC: 4.69 Mil/uL (ref 3.87–5.11)
RDW: 16.9 % — ABNORMAL HIGH (ref 11.5–15.5)
WBC: 5.6 10*3/uL (ref 4.0–10.5)

## 2014-08-25 LAB — IBC PANEL
Iron: 68 ug/dL (ref 42–145)
Saturation Ratios: 22.3 % (ref 20.0–50.0)
Transferrin: 217.7 mg/dL (ref 212.0–360.0)

## 2014-08-25 LAB — VITAMIN B12: Vitamin B-12: 1096 pg/mL — ABNORMAL HIGH (ref 211–911)

## 2014-08-25 MED ORDER — CIPROFLOXACIN HCL 500 MG PO TABS: 500.0000 mg | ORAL_TABLET | Freq: Two times a day (BID) | ORAL | Status: DC

## 2014-08-25 NOTE — Progress Notes (Signed)
Pre visit review using our clinic review tool, if applicable. No additional management support is needed unless otherwise documented below in the visit note. 

## 2014-08-26 ENCOUNTER — Encounter: Payer: Self-pay | Admitting: Internal Medicine

## 2014-08-27 ENCOUNTER — Encounter: Payer: Self-pay | Admitting: Internal Medicine

## 2014-08-27 DIAGNOSIS — N390 Urinary tract infection, site not specified: Secondary | ICD-10-CM | POA: Insufficient documentation

## 2014-08-27 NOTE — Progress Notes (Signed)
  Subjective:    Patient ID: [name] Nielsen, female    DOB: 04/01/1976, 38 y.o.   MRN: [id number]  Urinary Tract Infection   38 year old female with past history of hypertension, hypercholesterolemia and hypothyroidism who comes in today as a work in with concerns regarding a possible uti.  States symptoms started two days ago.  Reports increased lower abdominal pressure.  Describes an uncomfortable sensation.  No significant abdominal pain or cramping.  No back pain.  No fever or nausea or vomiting.  Does report dysuria.  No hematuria.  No vaginal itching or burning.          Past Medical History  Diagnosis Date  . Depression   . Hypertension   . Hypercholesterolemia   . Frequent headaches     hx of  . Heart murmur   . Hypothyroidism   . Hx: UTI (urinary tract infection)     child, s/p urinary procecure  . Anemia     hx of     Current Outpatient Prescriptions on File Prior to Visit  Medication Sig Dispense Refill  . acetaminophen (TYLENOL) 500 MG tablet Take 1,000 mg by mouth every 6 (six) hours as needed for mild pain or moderate pain.    Marland Kitchen. levothyroxine (SYNTHROID, LEVOTHROID) 175 MCG tablet TAKE ONE TABLET BY MOUTH ONCE DAILY BEFORE  BREAKFAST 30 tablet 5  . lisinopril-hydrochlorothiazide (PRINZIDE,ZESTORETIC) 20-25 MG per tablet Take 1 tablet by mouth every morning.     No current facility-administered medications on file prior to visit.    Review of Systems Urinary symptoms as outlined.  No fever.  Eating and drinking well.  No nausea or vomiting.  No significant abdominal pain.  no back pain.  No vaginal symptoms.          Objective:   Physical Exam  Filed Vitals:   08/25/14 0844  BP: 110/60  Pulse: 80  Temp: 98 F (7736.787 C)   38 year old female in no acute distress. HEART:  Appears to be regular. LUNGS:  No crackles or wheezing audible.  Respirations even and unlabored.   ABDOMEN:  Soft, nontender.  Bowel sounds present and normal.  No audible abdominal bruit.    BACK:  Non tender.  No CVA tenderness.          Assessment & Plan:  Acute cystitis without hematuria Urine dip c/w uti.  Treat with cipro.  Add culture.    Essential hypertension, benign Blood pressure doing well.  Follow.   Status post laparoscopic sleeve gastrectomy Doing well.  Continue to f/u with surgery.  Due to see her nutritionist in two weeks.    HEALTH MAINTENANCE.  Physical 07/05/13.   Mammogram 07/22/13 negative.   Had colonoscopy a couple of years ago.

## 2014-08-28 ENCOUNTER — Encounter: Payer: Self-pay | Admitting: Internal Medicine

## 2014-08-28 LAB — CULTURE, URINE COMPREHENSIVE

## 2014-08-30 ENCOUNTER — Ambulatory Visit: Payer: Managed Care, Other (non HMO) | Admitting: Dietician

## 2014-09-12 ENCOUNTER — Ambulatory Visit: Payer: Self-pay | Admitting: Internal Medicine

## 2014-09-12 LAB — HM MAMMOGRAPHY: HM Mammogram: NEGATIVE

## 2014-09-13 ENCOUNTER — Encounter: Payer: Self-pay | Admitting: *Deleted

## 2014-09-13 ENCOUNTER — Encounter: Payer: Managed Care, Other (non HMO) | Attending: Surgery | Admitting: Dietician

## 2014-09-13 VITALS — Ht 67.0 in | Wt 218.5 lb

## 2014-09-13 DIAGNOSIS — Z713 Dietary counseling and surveillance: Secondary | ICD-10-CM | POA: Diagnosis not present

## 2014-09-13 DIAGNOSIS — Z6834 Body mass index (BMI) 34.0-34.9, adult: Secondary | ICD-10-CM | POA: Diagnosis not present

## 2014-09-13 DIAGNOSIS — E669 Obesity, unspecified: Secondary | ICD-10-CM

## 2014-09-13 NOTE — Progress Notes (Signed)
  Follow-up visit:  8 Weeks Post-Operative Gastric sleeve Surgery  Medical Nutrition Therapy:  Appt start time: 505 end time:  530.  Primary concerns today: Post-operative Bariatric Surgery Nutrition Management. [name] Nielsen returns today feeling well and tolerating all recommended foods. However, she has tried some carbs like strawberries and sweet potatoes. She weighs herself daily and admits that this is causing her to become obsessive.   Surgery date: 07/05/14 Surgery type: gastric sleeve [facility] 262.5 lbs on 11/13/2013 Weight today: 218.5 lbs Weight change: 17.5 lbs Total weight loss: 44 lbs  TANITA  BODY COMP RESULTS  06/20/14 07/19/14 09/13/14   BMI (kg/m^2) 40.6 37.0 34.2   Fat Mass (lbs) 129 111.0 99.5   Fat Free Mass (lbs) 130.5 125.0 119   Total Body Water (lbs) 95.5 91.5 87    Preferred Learning Style:   No preference indicated   Learning Readiness:   Ready  24-hr recall: B (AM): protein shake with unsweetened almond milk, 3-4 frozen strawberries, and PB2 (30g) Snk (AM): P3 protein pack (12g) L (PM): 2-3 oz grilled chicken OR [employer] chili (12-21g) Snk (PM):   D (PM): 2-3 oz grilled chicken or pork chop with sweet potato or green beans (14-21g) Snk (PM):   Fluid intake: "struggling" ; decaf coffee, water, water with crystal light, protein shakes (45-50 oz total) Estimated total protein intake: 60-70g  Medications: taken off 1 BP medication Supplementation: taking  Using straws: no Drinking while eating: no Hair loss: yes, taking Biotin Carbonated beverages: no N/V/D/C: constipation, taking Miralax Dumping syndrome: no  Recent physical activity:  Gym 6x a week: cardio+weight  Progress Towards Goal(s):  In progress.  Handouts given during visit include:  Phase 3B lean protein + non starchy vegetables   Nutritional Diagnosis:  Garrett-3.3 Overweight/obesity related to past poor dietary habits and physical inactivity as evidenced by patient w/  recent gastric sleeve surgery following dietary guidelines for continued weight loss.     Intervention:  Nutrition counseling provided.  Plan: -Limit carbs to 15g per meal -Work on getting 64 oz of fluid -Eat protein foods first -Weigh yourself 1x a week  Teaching Method Utilized:  Visual Auditory Hands on  Barriers to learning/adherence to lifestyle change: none  Demonstrated degree of understanding via:  Teach Back   Monitoring/Evaluation:  Dietary intake, exercise, and body weight. Follow up in 1 months for 3 month post-op visit.

## 2014-09-13 NOTE — Patient Instructions (Addendum)
-  Limit carbs to 15g per meal -Work on getting 64 oz of fluid -Eat protein foods first -Weigh yourself 1x a week  TANITA  BODY COMP RESULTS  06/20/14 07/19/14 09/13/14   BMI (kg/m^2) 40.6 37.0 34.2   Fat Mass (lbs) 129 111.0 99.5   Fat Free Mass (lbs) 130.5 125.0 119   Total Body Water (lbs) 95.5 91.5 87

## 2014-09-19 ENCOUNTER — Other Ambulatory Visit: Payer: Self-pay | Admitting: Internal Medicine

## 2014-10-17 ENCOUNTER — Ambulatory Visit: Payer: Managed Care, Other (non HMO) | Admitting: Dietician

## 2014-10-28 ENCOUNTER — Other Ambulatory Visit: Payer: Self-pay | Admitting: Internal Medicine

## 2014-11-15 ENCOUNTER — Ambulatory Visit: Payer: Self-pay | Admitting: Internal Medicine

## 2014-11-15 ENCOUNTER — Ambulatory Visit (INDEPENDENT_AMBULATORY_CARE_PROVIDER_SITE_OTHER): Payer: Managed Care, Other (non HMO) | Admitting: Internal Medicine

## 2014-11-15 ENCOUNTER — Encounter: Payer: Self-pay | Admitting: Internal Medicine

## 2014-11-15 VITALS — BP 104/60 | HR 73 | Temp 98.2°F | Ht 67.0 in | Wt 209.5 lb

## 2014-11-15 DIAGNOSIS — I10 Essential (primary) hypertension: Secondary | ICD-10-CM

## 2014-11-15 DIAGNOSIS — R7989 Other specified abnormal findings of blood chemistry: Secondary | ICD-10-CM

## 2014-11-15 DIAGNOSIS — E7800 Pure hypercholesterolemia, unspecified: Secondary | ICD-10-CM

## 2014-11-15 DIAGNOSIS — Z0000 Encounter for general adult medical examination without abnormal findings: Secondary | ICD-10-CM

## 2014-11-15 DIAGNOSIS — D649 Anemia, unspecified: Secondary | ICD-10-CM

## 2014-11-15 DIAGNOSIS — R945 Abnormal results of liver function studies: Secondary | ICD-10-CM

## 2014-11-15 DIAGNOSIS — E669 Obesity, unspecified: Secondary | ICD-10-CM

## 2014-11-15 DIAGNOSIS — E039 Hypothyroidism, unspecified: Secondary | ICD-10-CM

## 2014-11-15 MED ORDER — LISINOPRIL 20 MG PO TABS: 20.0000 mg | ORAL_TABLET | Freq: Every day | ORAL | Status: DC

## 2014-11-15 NOTE — Progress Notes (Signed)
Patient ID: [name] Nielsen, female   DOB: [birth year], 39 y.o.   MRN: [id number]   Subjective:    Patient ID: [name] Nielsen, female    DOB: [birth year], 39 y.o.   MRN: [id number]  HPI  Patient here for a physical.  Recently had gastric surgery.  Has lost over 50 pounds.  Continues to lose.  Is exercising.  Recently noticed her blood pressure was low.  Stopped her blood pressure medication.  Noticed her blood pressure started increasing.  Blood pressure has been doing better now.  She also reports left low back pain.  Pain extends into her left leg.  Bothers her more if she stands for a while.  Exercise does not aggravate.  Notices some pain and tingling.  Breathing stable.  No cardiac symptoms with increased activity or exertion.     Past Medical History  Diagnosis Date  . Depression   . Hypertension   . Hypercholesterolemia   . Frequent headaches     hx of  . Heart murmur   . Hypothyroidism   . Hx: UTI (urinary tract infection)     child, s/p urinary procecure  . Anemia     hx of     Current Outpatient Prescriptions on File Prior to Visit  Medication Sig Dispense Refill  . acetaminophen (TYLENOL) 500 MG tablet Take 1,000 mg by mouth every 6 (six) hours as needed for mild pain or moderate pain.    . Cyanocobalamin (B-12 PO) Take by mouth.    . levothyroxine (SYNTHROID, LEVOTHROID) 175 MCG tablet TAKE ONE TABLET BY MOUTH ONCE DAILY BEFORE  BREAKFAST 30 tablet 5  . Multiple Vitamins-Iron (MULTIVITAMINS WITH IRON) TABS tablet Take 1 tablet by mouth daily.     No current facility-administered medications on file prior to visit.    Review of Systems  Constitutional: Negative for fatigue and unexpected weight change (has been adjusting her diet and has been exercising.  ).  HENT: Negative for congestion, sinus pressure and sore throat.   Eyes: Negative for pain and visual disturbance.  Respiratory: Negative for cough, chest tightness and shortness of breath.   Cardiovascular:  Negative for chest pain, palpitations and leg swelling.  Gastrointestinal: Negative for abdominal pain, diarrhea and constipation.  Genitourinary: Negative for frequency and difficulty urinating.  Musculoskeletal: Positive for back pain (left lower back pain with pain down her left leg.  some tingling. hurst worse if stands for a while. ). Negative for joint swelling.  Skin: Negative for color change and rash.  Neurological: Negative for dizziness and headaches.  Hematological: Negative for adenopathy. Does not bruise/bleed easily.  Psychiatric/Behavioral: Negative for dysphoric mood and decreased concentration.       Objective:    Physical Exam  Constitutional: She is oriented to person, place, and time. She appears well-developed and well-nourished.  HENT:  Nose: Nose normal.  Mouth/Throat: Oropharynx is clear and moist.  Eyes: Right eye exhibits no discharge. Left eye exhibits no discharge. No scleral icterus.  Neck: Neck supple. No thyromegaly present.  Cardiovascular: Normal rate and regular rhythm.   Pulmonary/Chest: Breath sounds normal. No accessory muscle usage. No tachypnea. No respiratory distress. She has no decreased breath sounds. She has no wheezes. She has no rhonchi. Right breast exhibits no inverted nipple, no mass, no nipple discharge and no tenderness (no axillary adenopathy). Left breast exhibits no inverted nipple, no mass, no nipple discharge and no tenderness (no axilarry adenopathy).  Abdominal: Soft. Bowel sounds are normal. There is no tenderness.  Musculoskeletal: She exhibits no edema or tenderness.  Lymphadenopathy:    She has no cervical adenopathy.  Neurological: She is alert and oriented to person, place, and time.  Skin: Skin is warm. No rash noted.  Psychiatric: She has a normal mood and affect. Her behavior is normal.    BP 104/60 mmHg  Pulse 73  Temp(Src) 98.2 F (36.8 C) (Oral)  Ht  (1.702 m)  Wt 209 lb 8 oz (95.029 kg)  BMI 32.80 kg/m2   SpO2 99%  LMP 11/01/2014 (Approximate) Wt Readings from Last 3 Encounters:  11/15/14 209 lb 8 oz (95.029 kg)  09/14/14 218 lb 8 oz (99.111 kg)  08/25/14 226 lb 8 oz (102.74 kg)     Lab Results  Component Value Date   WBC 5.6 08/25/2014   HGB 11.6* 08/25/2014   HCT 35.7* 08/25/2014   PLT 260.0 08/25/2014   GLUCOSE 91 08/25/2014   CHOL 209* 08/25/2014   TRIG 106.0 08/25/2014   HDL 44.30 08/25/2014   LDLDIRECT 178.3 11/11/2013   LDLCALC 144* 08/25/2014   ALT 21 08/25/2014   AST 18 08/25/2014   NA 136 08/25/2014   K 3.8 08/25/2014   CL 101 08/25/2014   CREATININE 0.9 08/25/2014   BUN 10 08/25/2014   CO2 26 08/25/2014   TSH 2.76 11/11/2013       Assessment & Plan:   Problem List Items Addressed This Visit    Abnormal liver function test    Has adjusted diet and lost weight.  Recheck liver panel.        Relevant Orders   Hepatic function panel   Anemia    Recent decreased hgb.  Iron stores and B12 ok.  Recheck cbc and ferritin.        Relevant Orders   CBC with Differential/Platelet   Ferritin   Essential hypertension, benign - Primary    Blood pressure running low recently.  Medication stopped for a while and then blood pressure elevated again.  Will change her medication to lisinopril  q day.  Follow pressures.  Get her back in soon to reassess.  Have her spot check her pressure.        Relevant Medications   lisinopril (PRINIVIL,ZESTRIL) tablet   Other Relevant Orders   Basic metabolic panel   Health care maintenance    Physical today.        Hypercholesterolemia    Low cholesterol diet and exercise.  Follow lipid panel.        Relevant Medications   lisinopril (PRINIVIL,ZESTRIL) tablet   Other Relevant Orders   Lipid panel   Hypothyroidism    On thyroid replacement.  Follow tsh.        Relevant Orders   TSH   Obesity (BMI 30-39.9)    Diet and exercise.          I spent 25 minutes with the patient and more than 50% of the time was  spent in consultation regarding the above.     Dale [place], MD

## 2014-11-15 NOTE — Progress Notes (Signed)
Pre visit review using our clinic review tool, if applicable. No additional management support is needed unless otherwise documented below in the visit note. 

## 2014-11-17 ENCOUNTER — Telehealth: Payer: Self-pay | Admitting: Internal Medicine

## 2014-11-17 DIAGNOSIS — M545 Low back pain: Secondary | ICD-10-CM

## 2014-11-17 NOTE — Telephone Encounter (Signed)
Order for physical therapy placed.

## 2014-11-17 NOTE — Addendum Note (Signed)
Addended by: Charm BargesSCOTT, Xavion Muscat S on: 11/17/2014 08:57 AM   Modules accepted: Orders

## 2014-11-17 NOTE — Telephone Encounter (Signed)
Pt notified of xray results via my chart.  Refer to physical therapy.

## 2014-11-20 ENCOUNTER — Encounter: Payer: Self-pay | Admitting: Internal Medicine

## 2014-11-20 DIAGNOSIS — Z0000 Encounter for general adult medical examination without abnormal findings: Secondary | ICD-10-CM | POA: Insufficient documentation

## 2014-11-20 NOTE — Assessment & Plan Note (Signed)
Physical today.   

## 2014-11-20 NOTE — Assessment & Plan Note (Signed)
On thyroid replacement.  Follow tsh.  

## 2014-11-20 NOTE — Assessment & Plan Note (Signed)
Low cholesterol diet and exercise.  Follow lipid panel.   

## 2014-11-20 NOTE — Assessment & Plan Note (Signed)
Blood pressure running low recently.  Medication stopped for a while and then blood pressure elevated again.  Will change her medication to lisinopril 20mg  q day.  Follow pressures.  Get her back in soon to reassess.  Have her spot check her pressure.

## 2014-11-20 NOTE — Assessment & Plan Note (Signed)
Diet and exercise.   

## 2014-11-20 NOTE — Assessment & Plan Note (Signed)
Has adjusted diet and lost weight.  Recheck liver panel.

## 2014-11-20 NOTE — Assessment & Plan Note (Signed)
Recent decreased hgb.  Iron stores and B12 ok.  Recheck cbc and ferritin.

## 2014-11-23 ENCOUNTER — Encounter: Payer: Self-pay | Admitting: Internal Medicine

## 2014-12-08 ENCOUNTER — Encounter: Payer: Self-pay | Admitting: Internal Medicine

## 2015-01-06 ENCOUNTER — Other Ambulatory Visit (INDEPENDENT_AMBULATORY_CARE_PROVIDER_SITE_OTHER): Payer: Managed Care, Other (non HMO)

## 2015-01-06 DIAGNOSIS — R7989 Other specified abnormal findings of blood chemistry: Secondary | ICD-10-CM | POA: Diagnosis not present

## 2015-01-06 DIAGNOSIS — E7800 Pure hypercholesterolemia, unspecified: Secondary | ICD-10-CM

## 2015-01-06 DIAGNOSIS — I10 Essential (primary) hypertension: Secondary | ICD-10-CM | POA: Diagnosis not present

## 2015-01-06 DIAGNOSIS — E039 Hypothyroidism, unspecified: Secondary | ICD-10-CM

## 2015-01-06 DIAGNOSIS — D649 Anemia, unspecified: Secondary | ICD-10-CM | POA: Diagnosis not present

## 2015-01-06 DIAGNOSIS — R945 Abnormal results of liver function studies: Secondary | ICD-10-CM

## 2015-01-06 LAB — HEPATIC FUNCTION PANEL
ALT: 14 U/L (ref 0–35)
AST: 15 U/L (ref 0–37)
Albumin: 4.1 g/dL (ref 3.5–5.2)
Alkaline Phosphatase: 45 U/L (ref 39–117)
BILIRUBIN DIRECT: 0.1 mg/dL (ref 0.0–0.3)
BILIRUBIN TOTAL: 0.3 mg/dL (ref 0.2–1.2)
Total Protein: 7.3 g/dL (ref 6.0–8.3)

## 2015-01-06 LAB — CBC WITH DIFFERENTIAL/PLATELET
Basophils Absolute: 0 10*3/uL (ref 0.0–0.1)
Basophils Relative: 0.7 % (ref 0.0–3.0)
EOS PCT: 1.8 % (ref 0.0–5.0)
Eosinophils Absolute: 0.1 10*3/uL (ref 0.0–0.7)
HCT: 34.1 % — ABNORMAL LOW (ref 36.0–46.0)
HEMOGLOBIN: 11.5 g/dL — AB (ref 12.0–15.0)
LYMPHS ABS: 2.2 10*3/uL (ref 0.7–4.0)
LYMPHS PCT: 42.4 % (ref 12.0–46.0)
MCHC: 33.7 g/dL (ref 30.0–36.0)
MCV: 74.6 fl — ABNORMAL LOW (ref 78.0–100.0)
MONOS PCT: 7.2 % (ref 3.0–12.0)
Monocytes Absolute: 0.4 10*3/uL (ref 0.1–1.0)
Neutro Abs: 2.4 10*3/uL (ref 1.4–7.7)
Neutrophils Relative %: 47.9 % (ref 43.0–77.0)
PLATELETS: 290 10*3/uL (ref 150.0–400.0)
RBC: 4.57 Mil/uL (ref 3.87–5.11)
RDW: 14 % (ref 11.5–15.5)
WBC: 5.1 10*3/uL (ref 4.0–10.5)

## 2015-01-06 LAB — BASIC METABOLIC PANEL
BUN: 14 mg/dL (ref 6–23)
CALCIUM: 9.6 mg/dL (ref 8.4–10.5)
CO2: 31 mEq/L (ref 19–32)
CREATININE: 0.94 mg/dL (ref 0.40–1.20)
Chloride: 101 mEq/L (ref 96–112)
GFR: 69.68 mL/min (ref >60.00)
Glucose, Bld: 90 mg/dL (ref 70–99)
Potassium: 3.7 mEq/L (ref 3.5–5.1)
Sodium: 136 mEq/L (ref 135–145)

## 2015-01-06 LAB — LIPID PANEL
CHOL/HDL RATIO: 4
Cholesterol: 218 mg/dL — ABNORMAL HIGH (ref 0–200)
HDL: 56.7 mg/dL (ref >39.00)
LDL Cholesterol: 142 mg/dL — ABNORMAL HIGH (ref 0–99)
NonHDL: 161.3
Triglycerides: 97 mg/dL (ref 0.0–149.0)
VLDL: 19.4 mg/dL (ref 0.0–40.0)

## 2015-01-06 LAB — TSH: TSH: 0.12 u[IU]/mL — AB (ref 0.35–4.50)

## 2015-01-06 LAB — FERRITIN: FERRITIN: 15.1 ng/mL (ref 10.0–291.0)

## 2015-01-07 ENCOUNTER — Encounter: Payer: Self-pay | Admitting: Internal Medicine

## 2015-01-07 MED ORDER — LEVOTHYROXINE SODIUM 150 MCG PO TABS: 150.0000 ug | ORAL_TABLET | Freq: Every day | ORAL | Status: DC

## 2015-01-07 NOTE — Telephone Encounter (Signed)
rx sent in for synthroid q day #30 with 2 refills.  Will need tsh in 6 weeks.

## 2015-01-13 ENCOUNTER — Encounter: Payer: Self-pay | Admitting: Internal Medicine

## 2015-01-13 ENCOUNTER — Ambulatory Visit (INDEPENDENT_AMBULATORY_CARE_PROVIDER_SITE_OTHER): Payer: Managed Care, Other (non HMO) | Admitting: Internal Medicine

## 2015-01-13 VITALS — BP 108/67 | HR 70 | Temp 98.3°F | Ht 67.0 in | Wt 206.4 lb

## 2015-01-13 DIAGNOSIS — R7989 Other specified abnormal findings of blood chemistry: Secondary | ICD-10-CM | POA: Diagnosis not present

## 2015-01-13 DIAGNOSIS — I10 Essential (primary) hypertension: Secondary | ICD-10-CM

## 2015-01-13 DIAGNOSIS — E669 Obesity, unspecified: Secondary | ICD-10-CM

## 2015-01-13 DIAGNOSIS — E7800 Pure hypercholesterolemia, unspecified: Secondary | ICD-10-CM

## 2015-01-13 DIAGNOSIS — D649 Anemia, unspecified: Secondary | ICD-10-CM

## 2015-01-13 DIAGNOSIS — R41840 Attention and concentration deficit: Secondary | ICD-10-CM

## 2015-01-13 DIAGNOSIS — E039 Hypothyroidism, unspecified: Secondary | ICD-10-CM

## 2015-01-13 DIAGNOSIS — Z0000 Encounter for general adult medical examination without abnormal findings: Secondary | ICD-10-CM

## 2015-01-13 DIAGNOSIS — Z9884 Bariatric surgery status: Secondary | ICD-10-CM

## 2015-01-13 DIAGNOSIS — R945 Abnormal results of liver function studies: Secondary | ICD-10-CM

## 2015-01-13 MED ORDER — LISINOPRIL-HYDROCHLOROTHIAZIDE 10-12.5 MG PO TABS: 1.0000 | ORAL_TABLET | Freq: Every day | ORAL | Status: DC

## 2015-01-13 NOTE — Progress Notes (Signed)
Patient ID: [name] Nielsen, female   DOB: [birth year], 39 y.o.   MRN: [id number]   Subjective:    Patient ID: [name] DoloresElizabeth Nielsen, female    DOB: [birth year], 39 y.o.   MRN: [id number]  HPI  Patient here for a scheduled follow up.  S/p lap sleeve gastrectomy. Has lost weight.  Feels better.  Adjusting her diet.  Exercising.  Feels better.  Is concerned that she may have ADHD.  States is having more trouble lately.  Can't finish task.  Easily distracted.  Unable to focus.  Discussed w/up and evaluation.  Agreeable.     Past Medical History  Diagnosis Date  . Depression   . Hypertension   . Hypercholesterolemia   . Frequent headaches     hx of  . Heart murmur   . Hypothyroidism   . Hx: UTI (urinary tract infection)     child, s/p urinary procecure  . Anemia     hx of     Current Outpatient Prescriptions on File Prior to Visit  Medication Sig Dispense Refill  . acetaminophen (TYLENOL) 500 MG tablet Take 1,000 mg by mouth every 6 (six) hours as needed for mild pain or moderate pain.    . Cyanocobalamin (B-12 PO) Take by mouth.    . levothyroxine (SYNTHROID) 150 MCG tablet Take 1 tablet (150 mcg total) by mouth daily before breakfast. 30 tablet 2  . Multiple Vitamins-Iron (MULTIVITAMINS WITH IRON) TABS tablet Take 1 tablet by mouth daily.     No current facility-administered medications on file prior to visit.    Review of Systems  Constitutional: Negative for appetite change and unexpected weight change.  HENT: Negative for congestion and sinus pressure.   Respiratory: Negative for cough, chest tightness and shortness of breath.   Cardiovascular: Negative for chest pain, palpitations and leg swelling.  Gastrointestinal: Negative for nausea, vomiting, abdominal pain and diarrhea.  Neurological: Negative for dizziness, light-headedness and headaches.  Psychiatric/Behavioral:       Unable to focus.  Easily distracted.        Objective:    Physical Exam  Constitutional: She appears  well-developed and well-nourished. No distress.  HENT:  Nose: Nose normal.  Mouth/Throat: Oropharynx is clear and moist.  Neck: Neck supple. No thyromegaly present.  Cardiovascular: Normal rate and regular rhythm.   Pulmonary/Chest: Breath sounds normal. No respiratory distress. She has no wheezes.  Abdominal: Soft. Bowel sounds are normal. There is no tenderness.  Musculoskeletal: She exhibits no edema or tenderness.  Lymphadenopathy:    She has no cervical adenopathy.  Skin: No rash noted. No erythema.    BP 108/67 mmHg  Pulse 70  Temp(Src) 98.3 F (36.8 C) (Oral)  Ht 5\' 7"  (1.702 m)  Wt 206 lb 6 oz (93.611 kg)  BMI 32.32 kg/m2  SpO2 100% Wt Readings from Last 3 Encounters:  01/13/15 206 lb 6 oz (93.611 kg)  11/15/14 209 lb 8 oz (95.029 kg)  09/14/14 218 lb 8 oz (99.111 kg)     Lab Results  Component Value Date   WBC 5.1 01/06/2015   HGB 11.5* 01/06/2015   HCT 34.1* 01/06/2015   PLT 290.0 01/06/2015   GLUCOSE 90 01/06/2015   CHOL 218* 01/06/2015   TRIG 97.0 01/06/2015   HDL 56.70 01/06/2015   LDLDIRECT 178.3 11/11/2013   LDLCALC 142* 01/06/2015   ALT 14 01/06/2015   AST 15 01/06/2015   NA 136 01/06/2015   K 3.7 01/06/2015   CL 101 01/06/2015   CREATININE  0.94 01/06/2015   BUN 14 01/06/2015   CO2 31 01/06/2015   TSH 0.12* 01/06/2015       Assessment & Plan:   Problem List Items Addressed This Visit    Abnormal liver function test    Most recent check wnl.  Follow.       Anemia    hgb 11.5.  Stable.  Follow.       Essential hypertension, benign - Primary    Blood pressure doing well.  Is low.  Decrease her blood pressure medication to 10/12.5 q day.  Follow pressures.  Get her back in soon to reassess.        Relevant Medications   lisinopril-hydrochlorothiazide (PRINZIDE,ZESTORETIC) 10-12.5 MG per tablet   Health care maintenance    Physical 11/15/14.  Mammogram 09/12/14 - Birads I.  PAP 07/05/13 negative with negative HPV.         Hypercholesterolemia    Low cholesterol diet and exercise.  Follow.       Relevant Medications   lisinopril-hydrochlorothiazide (PRINZIDE,ZESTORETIC) 10-12.5 MG per tablet   Hypothyroidism    On thyroid replacement.  Follow tsh.  Just had to adjust the dose.  Follow tsh.       Obesity (BMI 30-39.9)    Has lost weight.  Doing well.  Follow.       Poor concentration    Decreased concentration and focus.  Refer to Dr Maryruth Bun for further evaluation and testing.        Relevant Orders   Ambulatory referral to Psychiatry   Status post laparoscopic sleeve gastrectomy    Doing well with her diet and weight loss.  Exercise.  Follow.          I spent 25 minutes with the patient and more than 50% of the time was spent in consultation regarding the above.     Dale Kay, MD

## 2015-01-13 NOTE — Progress Notes (Signed)
Pre visit review using our clinic review tool, if applicable. No additional management support is needed unless otherwise documented below in the visit note. 

## 2015-01-22 ENCOUNTER — Encounter: Payer: Self-pay | Admitting: Internal Medicine

## 2015-01-22 ENCOUNTER — Telehealth: Payer: Self-pay | Admitting: Internal Medicine

## 2015-01-22 DIAGNOSIS — E039 Hypothyroidism, unspecified: Secondary | ICD-10-CM

## 2015-01-22 DIAGNOSIS — R41840 Attention and concentration deficit: Secondary | ICD-10-CM | POA: Insufficient documentation

## 2015-01-22 NOTE — Assessment & Plan Note (Signed)
Blood pressure doing well.  Is low.  Decrease her blood pressure medication to 10/12.5 q day.  Follow pressures.  Get her back in soon to reassess.

## 2015-01-22 NOTE — Telephone Encounter (Signed)
Pt was notified of labs via my chart.  Needs a f/u non fasting lab appt in 6 weeks.  Please notify the patient of the appointment date and time.  Thanks.   

## 2015-01-22 NOTE — Assessment & Plan Note (Signed)
Doing well with her diet and weight loss.  Exercise.  Follow.

## 2015-01-22 NOTE — Assessment & Plan Note (Signed)
hgb 11.5.  Stable.  Follow.

## 2015-01-22 NOTE — Assessment & Plan Note (Signed)
Decreased concentration and focus.  Refer to Dr Maryruth BunKapur for further evaluation and testing.

## 2015-01-22 NOTE — Assessment & Plan Note (Signed)
Has lost weight.  Doing well.  Follow.

## 2015-01-22 NOTE — Assessment & Plan Note (Signed)
On thyroid replacement.  Follow tsh.  Just had to adjust the dose.  Follow tsh.

## 2015-01-22 NOTE — Assessment & Plan Note (Signed)
Most recent check wnl.  Follow.    

## 2015-01-22 NOTE — Assessment & Plan Note (Signed)
Low cholesterol diet and exercise.  Follow.   

## 2015-01-22 NOTE — Assessment & Plan Note (Signed)
Physical 11/15/14.  Mammogram 09/12/14 - Birads I.  PAP 07/05/13 negative with negative HPV.

## 2015-01-30 ENCOUNTER — Encounter: Payer: Self-pay | Admitting: Internal Medicine

## 2015-02-02 ENCOUNTER — Encounter: Payer: Self-pay | Admitting: Internal Medicine

## 2015-03-07 ENCOUNTER — Other Ambulatory Visit: Payer: Managed Care, Other (non HMO)

## 2015-03-07 ENCOUNTER — Other Ambulatory Visit (INDEPENDENT_AMBULATORY_CARE_PROVIDER_SITE_OTHER): Payer: Managed Care, Other (non HMO)

## 2015-03-07 DIAGNOSIS — E039 Hypothyroidism, unspecified: Secondary | ICD-10-CM

## 2015-03-08 LAB — TSH: TSH: 2.28 u[IU]/mL (ref 0.35–4.50)

## 2015-03-09 ENCOUNTER — Encounter: Payer: Self-pay | Admitting: Internal Medicine

## 2015-04-18 LAB — HEPATIC FUNCTION PANEL
ALK PHOS: 48 U/L (ref 25–125)
ALT: 15 U/L (ref 7–35)
AST: 15 U/L (ref 13–35)
Bilirubin, Total: 0.2 mg/dL

## 2015-04-18 LAB — BASIC METABOLIC PANEL
BUN: 9 mg/dL (ref 4–21)
Creatinine: 0.9 mg/dL (ref 0.5–1.1)
Glucose: 102 mg/dL
Potassium: 3.7 mmol/L (ref 3.4–5.3)
Sodium: 142 mmol/L (ref 137–147)

## 2015-04-18 LAB — CBC AND DIFFERENTIAL
HEMATOCRIT: 35 % — AB (ref 36–46)
HEMOGLOBIN: 11.6 g/dL — AB (ref 12.0–16.0)
Neutrophils Absolute: 2 /uL
PLATELETS: 293 10*3/uL (ref 150–399)
WBC: 5 10*3/mL

## 2015-04-25 ENCOUNTER — Encounter: Payer: Self-pay | Admitting: Internal Medicine

## 2015-05-01 ENCOUNTER — Other Ambulatory Visit: Payer: Self-pay | Admitting: Internal Medicine

## 2015-05-03 ENCOUNTER — Encounter: Payer: Self-pay | Admitting: Internal Medicine

## 2015-05-03 DIAGNOSIS — E039 Hypothyroidism, unspecified: Secondary | ICD-10-CM

## 2015-05-03 DIAGNOSIS — E7800 Pure hypercholesterolemia, unspecified: Secondary | ICD-10-CM

## 2015-05-04 NOTE — Telephone Encounter (Signed)
Order placed for lipid panel and tsh.

## 2015-06-01 ENCOUNTER — Other Ambulatory Visit: Payer: Self-pay | Admitting: Internal Medicine

## 2015-06-01 NOTE — Telephone Encounter (Signed)
Please advise on what RX is correct. Pharmacy requested the higher dose but looks like you changed dose in 01/2015

## 2015-06-01 NOTE — Telephone Encounter (Signed)
Please call and clarify with pt what dose she has been taking.

## 2015-06-02 NOTE — Telephone Encounter (Signed)
Called patient and she stated that she had to go back to the higher dose due to her BP continued to increase.

## 2015-06-02 NOTE — Telephone Encounter (Signed)
LM for patient to call back.

## 2015-06-03 NOTE — Telephone Encounter (Signed)
rx sent in for lisinopril/hctz 20/25 q day #30 with one refills.

## 2015-06-23 ENCOUNTER — Ambulatory Visit (INDEPENDENT_AMBULATORY_CARE_PROVIDER_SITE_OTHER): Payer: Managed Care, Other (non HMO)

## 2015-06-23 ENCOUNTER — Ambulatory Visit: Payer: Managed Care, Other (non HMO)

## 2015-06-23 ENCOUNTER — Encounter: Payer: Self-pay | Admitting: Podiatry

## 2015-06-23 ENCOUNTER — Ambulatory Visit (INDEPENDENT_AMBULATORY_CARE_PROVIDER_SITE_OTHER): Payer: Managed Care, Other (non HMO) | Admitting: Podiatry

## 2015-06-23 VITALS — BP 97/56 | HR 74 | Resp 16 | Ht 67.0 in | Wt 205.0 lb

## 2015-06-23 DIAGNOSIS — M779 Enthesopathy, unspecified: Secondary | ICD-10-CM

## 2015-06-23 DIAGNOSIS — M7752 Other enthesopathy of left foot: Secondary | ICD-10-CM

## 2015-06-23 DIAGNOSIS — M2012 Hallux valgus (acquired), left foot: Secondary | ICD-10-CM | POA: Diagnosis not present

## 2015-06-23 DIAGNOSIS — M79672 Pain in left foot: Secondary | ICD-10-CM

## 2015-06-23 DIAGNOSIS — M778 Other enthesopathies, not elsewhere classified: Secondary | ICD-10-CM

## 2015-06-23 DIAGNOSIS — M79671 Pain in right foot: Secondary | ICD-10-CM | POA: Diagnosis not present

## 2015-06-23 DIAGNOSIS — M21612 Bunion of left foot: Secondary | ICD-10-CM

## 2015-06-23 MED ORDER — TRIAMCINOLONE ACETONIDE 10 MG/ML IJ SUSP
10.0000 mg | Freq: Once | INTRAMUSCULAR | Status: AC
  Administered 2015-06-23: 10 mg

## 2015-06-23 NOTE — Progress Notes (Signed)
   Subjective:    Patient ID: [name] Nielsen, female    DOB: 12-30-1975, 39 y.o.   MRN: [id number]  HPI Patient presents with foot pain in their Left foot, ball of foot. Pt stated, "has a burning sensation and hurts to walk on". This has been going on for the past 1 1/2 months.   Review of Systems  Constitutional: Positive for unexpected weight change.  All other systems reviewed and are negative.      Objective:   Physical Exam        Assessment & Plan:

## 2015-06-25 NOTE — Progress Notes (Signed)
Subjective:     Patient ID: [name] Nielsen, female   DOB: November 08, 1975, 39 y.o.   MRN: [id number]  HPI patient points to the bottom of the left foot stating that it's been very tender for at least 6 weeks and she does not recommend or remember injury. States that it's gotten gradually worse over that time   Review of Systems  All other systems reviewed and are negative.      Objective:   Physical Exam  Constitutional: She is oriented to person, place, and time.  Cardiovascular: Intact distal pulses.   Musculoskeletal: Normal range of motion.  Neurological: She is oriented to person, place, and time.  Skin: Skin is warm.  Nursing note and vitals reviewed.  neurovascular status intact with muscle strength adequate and range of motion subtalar midtarsal joint within normal limits. Patient's noted to have good digital perfusion is well oriented 3 and has exquisite discomfort second metatarsophalangeal joint left with fluid buildup in the joint surface and pain when palpated. The toe appears to be in good alignment with no indications of medial or dorsal disruption     Assessment:     Inflammatory capsulitis second MPJ left with inflammation noted    Plan:     H&P and condition reviewed with patient. At this point I have recommended aspiration along with small steroidal injection and explained risk and patient wants procedure. I did a proximal nerve block I aspirated the joint getting out a small amount of clear fluid and injected with a quarter cc of dexamethasone Kenalog and applied thick plantar padding to reduce pressure against the joint. Patient will be seen back in 2 weeks

## 2015-07-06 ENCOUNTER — Encounter: Payer: Self-pay | Admitting: Internal Medicine

## 2015-07-06 ENCOUNTER — Ambulatory Visit (INDEPENDENT_AMBULATORY_CARE_PROVIDER_SITE_OTHER): Payer: Managed Care, Other (non HMO) | Admitting: Internal Medicine

## 2015-07-06 VITALS — BP 104/70 | HR 71 | Temp 98.5°F | Resp 18 | Ht 67.0 in | Wt 207.4 lb

## 2015-07-06 DIAGNOSIS — D649 Anemia, unspecified: Secondary | ICD-10-CM

## 2015-07-06 DIAGNOSIS — Z1239 Encounter for other screening for malignant neoplasm of breast: Secondary | ICD-10-CM

## 2015-07-06 DIAGNOSIS — E039 Hypothyroidism, unspecified: Secondary | ICD-10-CM | POA: Diagnosis not present

## 2015-07-06 DIAGNOSIS — E7800 Pure hypercholesterolemia, unspecified: Secondary | ICD-10-CM

## 2015-07-06 DIAGNOSIS — E669 Obesity, unspecified: Secondary | ICD-10-CM

## 2015-07-06 DIAGNOSIS — G4700 Insomnia, unspecified: Secondary | ICD-10-CM

## 2015-07-06 DIAGNOSIS — R7989 Other specified abnormal findings of blood chemistry: Secondary | ICD-10-CM | POA: Diagnosis not present

## 2015-07-06 DIAGNOSIS — Z9884 Bariatric surgery status: Secondary | ICD-10-CM

## 2015-07-06 DIAGNOSIS — R945 Abnormal results of liver function studies: Secondary | ICD-10-CM

## 2015-07-06 DIAGNOSIS — F330 Major depressive disorder, recurrent, mild: Secondary | ICD-10-CM

## 2015-07-06 DIAGNOSIS — I10 Essential (primary) hypertension: Secondary | ICD-10-CM

## 2015-07-06 NOTE — Progress Notes (Signed)
Pre-visit discussion using our clinic review tool. No additional management support is needed unless otherwise documented below in the visit note.  

## 2015-07-06 NOTE — Progress Notes (Signed)
Patient ID: [name] Nielsen, female   DOB: Jun 24, 1976, 39 y.o.   MRN: [id number]   Subjective:    Patient ID: [name] Nielsen, female    DOB: Aug 16, 1976, 39 y.o.   MRN: [id number]  HPI  Patient with past history of lap sleeve gastrectomy, depression, sleeping issues and hypercholesterolemia.  She comes in today to follow up on these issues.  Seeing Dr Nicolasa Ducking for depression.  On wellbutrin.  Doing better.  Feels better.  Sleeping better with trazodone.  Trying to stay active.  No cardiac symptoms with increased activity or exertion.  No sob.  Eating and drinking well.  No nausea or vomiting.  Bowels stable.  Due to f/u with podiatry tomorrow.    Past Medical History  Diagnosis Date  . Depression   . Hypertension   . Hypercholesterolemia   . Frequent headaches     hx of  . Heart murmur   . Hypothyroidism   . Hx: UTI (urinary tract infection)     child, s/p urinary procecure  . Anemia     hx of   . Anxiety    Past Surgical History  Procedure Laterality Date  . Cholecystectomy  2012  . Tubal ligation  1999  . Laparoscopic gastric sleeve resection N/A 07/05/2014    Procedure: LAPAROSCOPIC GASTRIC SLEEVE RESECTION with upper endoscopy;  Surgeon: Kaylyn Lim, MD;  Location: [facility];  Service: General;  Laterality: N/A;   Family History  Problem Relation Age of Onset  . Cancer Mother     brain  . Diabetes Father   . Hypertension Father   . Kidney disease Father   . Stroke Father   . Cancer Father     prostate   Social History   Social History  . Marital Status: Single    Spouse Name: N/A  . Number of Children: N/A  . Years of Education: N/A   Social History Main Topics  . Smoking status: Never Smoker   . Smokeless tobacco: Never Used  . Alcohol Use: No  . Drug Use: No  . Sexual Activity: Not Asked   Other Topics Concern  . None   Social History Narrative    Outpatient Encounter Prescriptions as of 07/06/2015  Medication Sig  . acetaminophen (TYLENOL) 500 MG tablet  Take 1,000 mg by mouth every 6 (six) hours as needed for mild pain or moderate pain.  Marland Kitchen buPROPion (WELLBUTRIN SR) 100 MG 12 hr tablet Take 100 mg by mouth 2 (two) times daily.  . Cyanocobalamin (B-12 PO) Take by mouth.  . levothyroxine (SYNTHROID, LEVOTHROID) 150 MCG tablet TAKE ONE TABLET BY MOUTH BEFORE BREAKFAST  . lisinopril-hydrochlorothiazide (PRINZIDE,ZESTORETIC) 20-25 MG per tablet TAKE ONE TABLET BY MOUTH ONCE DAILY  . Multiple Vitamins-Iron (MULTIVITAMINS WITH IRON) TABS tablet Take 1 tablet by mouth daily.  . traZODone (DESYREL) 150 MG tablet as needed.  Marland Kitchen VYVANSE 20 MG capsule Take 20 mg by mouth daily.   No facility-administered encounter medications on file as of 07/06/2015.    Review of Systems  Constitutional: Negative for appetite change and unexpected weight change.  HENT: Negative for congestion and sinus pressure.   Eyes: Negative for pain and visual disturbance.  Respiratory: Negative for cough, chest tightness and shortness of breath.   Cardiovascular: Negative for chest pain, palpitations and leg swelling.  Gastrointestinal: Negative for nausea, vomiting, abdominal pain and diarrhea.  Genitourinary: Negative for dysuria and difficulty urinating.  Musculoskeletal: Negative for back pain.       Foot pain.  Seeing Dr Paulla Dolly.  Is better.    Skin: Negative for color change and rash.  Neurological: Negative for dizziness, light-headedness and headaches.  Psychiatric/Behavioral: Negative for dysphoric mood and agitation.       Doing better.  Seeing Dr Nicolasa Ducking.  On wellbutrin and trazodone.         Objective:     Blood pressure rechecked by me: 110/76  Physical Exam  Constitutional: She appears well-developed and well-nourished. No distress.  HENT:  Nose: Nose normal.  Mouth/Throat: Oropharynx is clear and moist.  Eyes: Conjunctivae are normal. Right eye exhibits no discharge. Left eye exhibits no discharge.  Neck: Neck supple. No thyromegaly present.    Cardiovascular: Normal rate and regular rhythm.   Pulmonary/Chest: Breath sounds normal. No respiratory distress. She has no wheezes.  Abdominal: Soft. Bowel sounds are normal. There is no tenderness.  Musculoskeletal: She exhibits no edema or tenderness.  Lymphadenopathy:    She has no cervical adenopathy.  Skin: No rash noted. No erythema.  Psychiatric: She has a normal mood and affect. Her behavior is normal.    BP 104/70 mmHg  Pulse 71  Temp(Src) 98.5 F (36.9 C) (Oral)  Resp 18  Ht '5\' 7"'  (1.702 m)  Wt 207 lb 6 oz (94.065 kg)  BMI 32.47 kg/m2  SpO2 96%  LMP 06/16/2015 Wt Readings from Last 3 Encounters:  07/06/15 207 lb 6 oz (94.065 kg)  06/23/15 205 lb (92.987 kg)  01/13/15 206 lb 6 oz (93.611 kg)     Lab Results  Component Value Date   WBC 5.0 04/18/2015   HGB 11.6* 04/18/2015   HCT 35* 04/18/2015   PLT 293 04/18/2015   GLUCOSE 90 01/06/2015   CHOL 218* 01/06/2015   TRIG 97.0 01/06/2015   HDL 56.70 01/06/2015   LDLDIRECT 178.3 11/11/2013   LDLCALC 142* 01/06/2015   ALT 15 04/18/2015   AST 15 04/18/2015   NA 142 04/18/2015   K 3.7 04/18/2015   CL 101 01/06/2015   CREATININE 0.9 04/18/2015   BUN 9 04/18/2015   CO2 31 01/06/2015   TSH 2.28 03/07/2015       Assessment & Plan:   Problem List Items Addressed This Visit    Abnormal liver function test    Follow liver panel.  Has lost weight.  Adjusted diet.       Relevant Orders   Hepatic function panel   Anemia    Had GI surgery.  Follow cbc.       Essential hypertension, benign    Blood pressure as outlined.  If remains low, may need to lower blood pressure medication.  Follow.  Follow met b.       Relevant Orders   CBC with Differential/Platelet   Basic metabolic panel   Hypercholesterolemia    Low cholesterol diet and exercise.  Follow lipid panel.        Hypothyroidism    On thyroid replacement.  Follow tsh.        Insomnia    Better on trazodone.  Follow.       Major depressive  disorder, recurrent episode ([place])    Seeing Dr Nicolasa Ducking.  On wellbutrin.  Doing better.  Planning to see a Social worker.  Follow.        Relevant Medications   buPROPion (WELLBUTRIN SR) 100 MG 12 hr tablet   traZODone (DESYREL) 150 MG tablet   Obesity (BMI 30-39.9)    Continue diet adjustment and exercise.  Follow.  Relevant Medications   VYVANSE 20 MG capsule   Status post laparoscopic sleeve gastrectomy    Has adjusted diet.  Has lost weight since her surgery.  Follow cbc.         Other Visit Diagnoses    Breast cancer screening    -  Primary    Relevant Orders    MM DIGITAL SCREENING BILATERAL        Einar Pheasant, MD

## 2015-07-07 ENCOUNTER — Ambulatory Visit (INDEPENDENT_AMBULATORY_CARE_PROVIDER_SITE_OTHER): Payer: Managed Care, Other (non HMO) | Admitting: Podiatry

## 2015-07-07 ENCOUNTER — Encounter: Payer: Self-pay | Admitting: Podiatry

## 2015-07-07 VITALS — BP 93/64 | HR 70 | Resp 16

## 2015-07-07 DIAGNOSIS — M778 Other enthesopathies, not elsewhere classified: Secondary | ICD-10-CM

## 2015-07-07 DIAGNOSIS — M779 Enthesopathy, unspecified: Principal | ICD-10-CM

## 2015-07-07 DIAGNOSIS — M7752 Other enthesopathy of left foot: Secondary | ICD-10-CM

## 2015-07-07 DIAGNOSIS — M2012 Hallux valgus (acquired), left foot: Secondary | ICD-10-CM

## 2015-07-07 DIAGNOSIS — M21612 Bunion of left foot: Secondary | ICD-10-CM

## 2015-07-07 NOTE — Progress Notes (Signed)
Subjective:     Patient ID: [name] Nielsen, female   DOB: 03/04/1976, 39 y.o.   MRN: [id number]  HPI patient states I'm feeling quite a bit better with my foot but I do get some back pain on my other side   Review of Systems     Objective:   Physical Exam Neurovascular status intact with diminished discomfort second MPJ left with fluid buildup of the reduced nature and discomfort in the posterior lateral aspect of the right hip with non-radiating pain    Assessment:     Improved from inflammatory capsulitis left with probability of muscular irritation of the posterior back which may be due to increased activity levels weight loss or compensation for the foot    Plan:     Reviewed all conditions and advised on continued physical therapy for the left foot along with rigid bottom shoes and she will see a doctor for her hip and will be seen back as needed

## 2015-07-10 ENCOUNTER — Telehealth: Payer: Self-pay | Admitting: Internal Medicine

## 2015-07-10 ENCOUNTER — Encounter: Payer: Self-pay | Admitting: Internal Medicine

## 2015-07-10 DIAGNOSIS — G4700 Insomnia, unspecified: Secondary | ICD-10-CM | POA: Insufficient documentation

## 2015-07-10 DIAGNOSIS — F320 Major depressive disorder, single episode, mild: Secondary | ICD-10-CM | POA: Insufficient documentation

## 2015-07-10 DIAGNOSIS — F32A Depression, unspecified: Secondary | ICD-10-CM | POA: Insufficient documentation

## 2015-07-10 NOTE — Telephone Encounter (Signed)
Pt states that she is taking 20/25

## 2015-07-10 NOTE — Assessment & Plan Note (Signed)
Blood pressure as outlined.  If remains low, may need to lower blood pressure medication.  Follow.  Follow met b.  

## 2015-07-10 NOTE — Assessment & Plan Note (Signed)
Better on trazodone.  Follow.

## 2015-07-10 NOTE — Assessment & Plan Note (Signed)
Continue diet adjustment and exercise.  Follow.

## 2015-07-10 NOTE — Assessment & Plan Note (Signed)
Had GI surgery.  Follow cbc.

## 2015-07-10 NOTE — Assessment & Plan Note (Signed)
Low cholesterol diet and exercise.  Follow lipid panel.   

## 2015-07-10 NOTE — Assessment & Plan Note (Signed)
Has adjusted diet.  Has lost weight since her surgery.  Follow cbc.

## 2015-07-10 NOTE — Assessment & Plan Note (Signed)
On thyroid replacement.  Follow tsh.  

## 2015-07-10 NOTE — Telephone Encounter (Signed)
Need to confirm with pt dose of lisinopril/hctz she is taking.  Is she on 20/25 or 10/12.5?  Thanks

## 2015-07-10 NOTE — Assessment & Plan Note (Signed)
Seeing Dr Maryruth Bun.  On wellbutrin.  Doing better.  Planning to see a Veterinary surgeon.  Follow.

## 2015-07-10 NOTE — Assessment & Plan Note (Signed)
Follow liver panel.  Has lost weight.  Adjusted diet.

## 2015-08-08 ENCOUNTER — Encounter: Payer: Self-pay | Admitting: Internal Medicine

## 2015-08-16 ENCOUNTER — Other Ambulatory Visit: Payer: Self-pay | Admitting: Internal Medicine

## 2015-08-17 ENCOUNTER — Other Ambulatory Visit (INDEPENDENT_AMBULATORY_CARE_PROVIDER_SITE_OTHER): Payer: Managed Care, Other (non HMO)

## 2015-08-17 DIAGNOSIS — R7989 Other specified abnormal findings of blood chemistry: Secondary | ICD-10-CM

## 2015-08-17 DIAGNOSIS — E039 Hypothyroidism, unspecified: Secondary | ICD-10-CM

## 2015-08-17 DIAGNOSIS — I10 Essential (primary) hypertension: Secondary | ICD-10-CM | POA: Diagnosis not present

## 2015-08-17 DIAGNOSIS — E7800 Pure hypercholesterolemia, unspecified: Secondary | ICD-10-CM

## 2015-08-17 DIAGNOSIS — R945 Abnormal results of liver function studies: Secondary | ICD-10-CM

## 2015-08-17 LAB — LIPID PANEL
CHOL/HDL RATIO: 4
CHOLESTEROL: 257 mg/dL — AB (ref 0–200)
HDL: 62.5 mg/dL (ref >39.00)
LDL Cholesterol: 171 mg/dL — ABNORMAL HIGH (ref 0–99)
NonHDL: 194.57
TRIGLYCERIDES: 120 mg/dL (ref 0.0–149.0)
VLDL: 24 mg/dL (ref 0.0–40.0)

## 2015-08-17 LAB — BASIC METABOLIC PANEL
BUN: 10 mg/dL (ref 6–23)
CALCIUM: 10 mg/dL (ref 8.4–10.5)
CO2: 28 mEq/L (ref 19–32)
CREATININE: 1.04 mg/dL (ref 0.40–1.20)
Chloride: 101 mEq/L (ref 96–112)
GFR: 61.82 mL/min (ref >60.00)
Glucose, Bld: 95 mg/dL (ref 70–99)
POTASSIUM: 4 meq/L (ref 3.5–5.1)
Sodium: 136 mEq/L (ref 135–145)

## 2015-08-17 LAB — HEPATIC FUNCTION PANEL
ALT: 14 U/L (ref 0–35)
AST: 14 U/L (ref 0–37)
Albumin: 4.3 g/dL (ref 3.5–5.2)
Alkaline Phosphatase: 45 U/L (ref 39–117)
BILIRUBIN DIRECT: 0.1 mg/dL (ref 0.0–0.3)
TOTAL PROTEIN: 7.5 g/dL (ref 6.0–8.3)
Total Bilirubin: 0.4 mg/dL (ref 0.2–1.2)

## 2015-08-17 LAB — TSH: TSH: 1.85 u[IU]/mL (ref 0.35–4.50)

## 2015-08-17 LAB — CBC WITH DIFFERENTIAL/PLATELET
BASOS PCT: 0.6 % (ref 0.0–3.0)
Basophils Absolute: 0 10*3/uL (ref 0.0–0.1)
EOS ABS: 0.1 10*3/uL (ref 0.0–0.7)
EOS PCT: 1.7 % (ref 0.0–5.0)
HEMATOCRIT: 36.3 % (ref 36.0–46.0)
HEMOGLOBIN: 11.7 g/dL — AB (ref 12.0–15.0)
LYMPHS PCT: 33.7 % (ref 12.0–46.0)
Lymphs Abs: 2 10*3/uL (ref 0.7–4.0)
MCHC: 32.3 g/dL (ref 30.0–36.0)
MCV: 72.8 fl — ABNORMAL LOW (ref 78.0–100.0)
Monocytes Absolute: 0.4 10*3/uL (ref 0.1–1.0)
Monocytes Relative: 6.7 % (ref 3.0–12.0)
Neutro Abs: 3.4 10*3/uL (ref 1.4–7.7)
Neutrophils Relative %: 57.3 % (ref 43.0–77.0)
Platelets: 295 10*3/uL (ref 150.0–400.0)
RBC: 4.99 Mil/uL (ref 3.87–5.11)
RDW: 14.8 % (ref 11.5–15.5)
WBC: 6 10*3/uL (ref 4.0–10.5)

## 2015-08-22 ENCOUNTER — Other Ambulatory Visit: Payer: Self-pay | Admitting: *Deleted

## 2015-08-22 MED ORDER — ROSUVASTATIN CALCIUM 5 MG PO TABS: 5.0000 mg | ORAL_TABLET | Freq: Every day | ORAL | Status: DC

## 2015-08-22 NOTE — Telephone Encounter (Signed)
Rx sent to [facility] on [address].(Crestor 5mg  #90)

## 2015-09-04 ENCOUNTER — Encounter: Payer: Managed Care, Other (non HMO) | Attending: Psychiatry | Admitting: Dietician

## 2015-09-04 VITALS — Ht 67.0 in | Wt 208.0 lb

## 2015-09-04 DIAGNOSIS — F5081 Binge eating disorder: Secondary | ICD-10-CM | POA: Diagnosis not present

## 2015-09-04 DIAGNOSIS — F509 Eating disorder, unspecified: Secondary | ICD-10-CM

## 2015-09-04 NOTE — Progress Notes (Signed)
Medical Nutrition Therapy: Visit start time: 13:35  end time: 14:35 Assessment:  Diagnosis: eating disorder-binge eating Past medical history: hypertension, hypercholesterolemia Psychosocial issues/ stress concerns: Patient rates her stress as moderate and indicates "ok" as to how well she is dealing with her stress. Preferred learning method:  Nicki [place] Current weight: 208 lbs (with shoes)  Height: 67 in Medications, supplements: see list Progress and evaluation:  Patient in for initial nutrition assessment. She reports she has a history of binge eating since teenage years. She reports that by "not eating" and then binging, she was able to maintain her weight between 130-135 lbs. until having children. She reports a highest weight of 285 lbs; she lost 30 lbs mainly due to thyroid disease but regained the weight. In Sept. 2015 she had "sleeve" bariatric surgery and has lost 70 lbs. She reports she started "modified" binging 03/2015 after her weight had plateaued and also with a less structured schedule. She states "it takes me longer to eat but I will buy a bag of cheetos on my way home from work and eat 3/4's of the bag. She reports binging several times per week. Her weight has remained relatively stable since June. She shared with Dr. Nicolasa Ducking her history of binging and she states that was the first time she has told anyone including her husband. She has not met with a therapist regarding her eating disorder but is open to doing so. She denies any form of purging.   Physical activity: none presently. She was working out at a gym but hurt her foot and has not been able to exercise in past 6 weeks and she reports her binging has increased during that time.  Dietary Intake:  Usual eating pattern includes 3 meals and several  snacks in the evening. Dining out frequency: 4-6 meals per week.  Breakfast: coffee with protein powder, or yogurt or 1 egg/coffee Lunch: 12:00- broccoli/cheese sauce, small [employer]  chili, water or coffee or "frozen" dinner Snack: Is very hungry when leaving work and has started picking up a snack on her way home. Supper: 6:00pm- pork chop or chicken with rice or potatoes and some type of vegetable-usually green beans or broccoli Snack: yogurt, chips, cake or brownie Beverages: water and coffee  Nutrition Care Education: Basic nutrition:  Discussed the importance of meeting with an eating disorder therapist to address issues contributing to her eating disorder. Discussed how another restrictive diet is not the answer. Stressed importance of establishing consistent meal times but also discussed the need for snacks since amount of food consumed at one time is limited. Encouraged to think of what can be added to meals rather than what needs to be taken away. Discussed importance of an afternoon snack to avoid excessive hunger after work which can trigger the binge cycle. Establish one time for an evening snack and eat what appeals but in a packaged or portioned amount. Can add raw vegetables to any snack. Discussed drinking calorie free beverages rather than having a second snack at night. Nutritional Diagnosis:  NB-1.5 Disordered eating pattern As related to history of binging.  As evidenced by diet history..  Intervention:  Include both a starch and protein with breakfast. Eat an afternoon snack that includes carbohydrate and protein. Establish one snack time after dinner ( 100 calorie pack of salty or sweet snack). Can add raw vegetables at any time of day. Allow no more than 30 minutes for snack. Avoid stopping after work to get snacks. Drink hot tea in  the evening. Exercise goal: 30 minutes, 3 times per week  Education Materials given:  Marland Kitchen Goals/ instructions Learner/ who was taught:  . Patient   Willingness to learn/ readiness for change: . Acceptance, ready for change  Monitoring and Evaluation:  Dietary intake, exercise, , and body weight      follow up:  10/03/15 at 1:30pm

## 2015-09-04 NOTE — Patient Instructions (Signed)
Include both a starch and protein with breakfast. Eat an afternoon snack that includes carbohydrate and protein. Establish one snack time after dinner ( 100 calorie pack of salty or sweet snack). Can add raw vegetables at any time of day. Allow no more than 30 minutes for snack. Avoid stopping after work to get snacks. Drink hot tea in the evening. Exercise goal: 30 minutes, 3 times per week

## 2015-09-15 ENCOUNTER — Ambulatory Visit
Admission: RE | Admit: 2015-09-15 | Discharge: 2015-09-15 | Disposition: A | Discharge status: Home / Self Care | Payer: Managed Care, Other (non HMO) | Source: Ambulatory Visit | Attending: Internal Medicine | Admitting: Internal Medicine

## 2015-09-15 DIAGNOSIS — Z1231 Encounter for screening mammogram for malignant neoplasm of breast: Secondary | ICD-10-CM | POA: Diagnosis not present

## 2015-09-15 DIAGNOSIS — Z1239 Encounter for other screening for malignant neoplasm of breast: Secondary | ICD-10-CM

## 2015-09-18 ENCOUNTER — Other Ambulatory Visit: Payer: Self-pay | Admitting: Internal Medicine

## 2015-10-03 ENCOUNTER — Encounter: Payer: Self-pay | Admitting: Dietician

## 2015-10-03 ENCOUNTER — Encounter: Payer: Managed Care, Other (non HMO) | Attending: Psychiatry | Admitting: Dietician

## 2015-10-03 VITALS — Ht 67.0 in | Wt 199.7 lb

## 2015-10-03 DIAGNOSIS — F5081 Binge eating disorder: Secondary | ICD-10-CM | POA: Diagnosis present

## 2015-10-03 DIAGNOSIS — F509 Eating disorder, unspecified: Secondary | ICD-10-CM

## 2015-10-03 NOTE — Progress Notes (Signed)
Medical Nutrition Therapy Follow-up visit:  Time with patient: 13:35- 14:05 Visit #:2 ASSESSMENT:  Diagnosis:eating disorder- binge eating (denies any form on purging)  Current weight:199.7 bs    Height:67 in Medications: See list Medical History: hypertension, hypercholesterolemia Progress and evaluation: Patient reports she has established a very structured pattern of eating and does not eat at other times with exception of raw or steamed vegetables. She has also followed through with plan to eat only one snack after dinner with exception of eating raw vegetables. She also is drinking hot tea in the evenings as suggested. She is planning and taking her lunch more that often includes a lunch meat, cheese,avocado and fruit. She plans her dinner meals; usually cooking 2 nights per week and then eating leftovers. Her diet continues to be low in calcium and whole grains. She is very reluctant to add healthy carbohydrates into her meal pattern for fear of over-eating. She reports that she has had only 1-2 times when she did not feel in control of her food intake and ate more than she should. She continues to deny any form of purging. She has not followed through to meet with eating disorder therapist but states she plans to do so. Her weight loss in past month was 8.3 lbs indicating possible over-restricting.  Physical activity: Walks daily for exercise; usually 30 minutes   NUTRITION CARE EDUCATION: Basic nutrition: Commended on establishing a consistent pattern of eating and on planning meals as well as adding more fruits/vegetables to diet. Discussed calcium needs and ways to increase in her diet. Also, encouraged to consider introducing more whole grain foods such as oats, quinoa, corn since she doesn't feel that she can eat whole grain bread products. Stressed again for need for counseling regarding issues contributing to binging since relapse risk is high especially with such a long history of  binging. Discussed again how over-restriction can trigger "binging".  INTERVENTION:  Consider a calcium supplement with 500-600 mg calcium to supplement calcium in yogurt, cheese and green vegetables. Continue with structured pattern of eating. Include at least one serving of starch at most meals-if possible, include some whole grains such as corn, quinoa, wild rice and oatmeal Follow-up with therapist regarding eating disorder.  EDUCATION MATERIALS GIVEN:  . Goals/ instructions LEARNER/ who was taught:  . Patient   LEVEL OF UNDERSTANDING: . Partial understanding; needs review/ practice LEARNING BARRIERS: . None  WILLINGNESS TO LEARN/READINESS FOR CHANGE: . Eager, change in progress  MONITORING AND EVALUATION:  Patient did not schedule a follow-up appointment today. Encouraged her to call if desires further help with her diet/nutrition.

## 2015-10-03 NOTE — Patient Instructions (Signed)
Consider a calcium supplement with 500-600 mg calcium to supplement calcium in yogurt, cheese and green vegetables. Continue with structured pattern of eating. Include at least one serving of starch at most meals-if possible, include some whole grains such as corn, quinoa, wild rice and oatmeal Follow-up with therapist regarding eating disorder.

## 2015-10-04 ENCOUNTER — Other Ambulatory Visit: Payer: Managed Care, Other (non HMO)

## 2015-10-05 ENCOUNTER — Ambulatory Visit: Payer: Managed Care, Other (non HMO) | Admitting: Internal Medicine

## 2015-10-05 ENCOUNTER — Other Ambulatory Visit: Payer: Managed Care, Other (non HMO)

## 2015-10-10 ENCOUNTER — Ambulatory Visit (INDEPENDENT_AMBULATORY_CARE_PROVIDER_SITE_OTHER): Payer: Managed Care, Other (non HMO) | Admitting: Internal Medicine

## 2015-10-10 ENCOUNTER — Encounter: Payer: Self-pay | Admitting: Internal Medicine

## 2015-10-10 VITALS — BP 110/60 | HR 76 | Temp 98.7°F | Resp 18 | Ht 67.0 in | Wt 197.8 lb

## 2015-10-10 DIAGNOSIS — E7800 Pure hypercholesterolemia, unspecified: Secondary | ICD-10-CM

## 2015-10-10 DIAGNOSIS — R7989 Other specified abnormal findings of blood chemistry: Secondary | ICD-10-CM

## 2015-10-10 DIAGNOSIS — Z9884 Bariatric surgery status: Secondary | ICD-10-CM | POA: Diagnosis not present

## 2015-10-10 DIAGNOSIS — G4700 Insomnia, unspecified: Secondary | ICD-10-CM

## 2015-10-10 DIAGNOSIS — D649 Anemia, unspecified: Secondary | ICD-10-CM

## 2015-10-10 DIAGNOSIS — I10 Essential (primary) hypertension: Secondary | ICD-10-CM | POA: Diagnosis not present

## 2015-10-10 DIAGNOSIS — E669 Obesity, unspecified: Secondary | ICD-10-CM

## 2015-10-10 DIAGNOSIS — F330 Major depressive disorder, recurrent, mild: Secondary | ICD-10-CM

## 2015-10-10 DIAGNOSIS — E039 Hypothyroidism, unspecified: Secondary | ICD-10-CM | POA: Diagnosis not present

## 2015-10-10 DIAGNOSIS — R945 Abnormal results of liver function studies: Secondary | ICD-10-CM

## 2015-10-10 NOTE — Progress Notes (Signed)
Pre-visit discussion using our clinic review tool. No additional management support is needed unless otherwise documented below in the visit note.  

## 2015-10-10 NOTE — Progress Notes (Signed)
Patient ID: [name] Nielsen, female   DOB: [birth year], 40 y.o.   MRN: [id number]   Subjective:    Patient ID: [name] DoloresElizabeth Dann, female    DOB: [birth year], 40 y.o.   MRN: [id number]  HPI  Patient with past history of hypercholesterolemia, anemia, anxiety, hypertension and depression.  She comes in today to follow up on these issues.  She is still adjusting her diet.  Losing weight.  Feels better.  No chest pain or tightness.  No sob.  No acid reflux.  No abdominal pain or cramping.  Bowels stable.  Has seen chiropractor.  Feels better.     Past Medical History  Diagnosis Date  . Depression   . Hypertension   . Hypercholesterolemia   . Frequent headaches     hx of  . Heart murmur   . Hypothyroidism   . Hx: UTI (urinary tract infection)     child, s/p urinary procecure  . Anemia     hx of   . Anxiety    Past Surgical History  Procedure Laterality Date  . Cholecystectomy  2012  . Tubal ligation  1999  . Laparoscopic gastric sleeve resection N/A 07/05/2014    Procedure: LAPAROSCOPIC GASTRIC SLEEVE RESECTION with upper endoscopy;  Surgeon: Wenda LowMatt Martin, MD;  Location: [facility];  Service: General;  Laterality: N/A;   Family History  Problem Relation Age of Onset  . Cancer Mother     brain  . Diabetes Father   . Hypertension Father   . Kidney disease Father   . Stroke Father   . Cancer Father     prostate   Social History   Social History  . Marital Status: Single    Spouse Name: N/A  . Number of Children: N/A  . Years of Education: N/A   Social History Main Topics  . Smoking status: Never Smoker   . Smokeless tobacco: Never Used  . Alcohol Use: No  . Drug Use: No  . Sexual Activity: Not Asked   Other Topics Concern  . None   Social History Narrative    Outpatient Encounter Prescriptions as of 10/10/2015  Medication Sig  . acetaminophen (TYLENOL) 500 MG tablet Take 1,000 mg by mouth every 6 (six) hours as needed for mild pain or moderate pain.  Marland Kitchen. buPROPion  (WELLBUTRIN SR) 100 MG 12 hr tablet Take 100 mg by mouth 2 (two) times daily.  . Cyanocobalamin (B-12 PO) Take by mouth.  . levothyroxine (SYNTHROID, LEVOTHROID) 150 MCG tablet TAKE ONE TABLET BY MOUTH ONCE DAILY BEFORE BREAKFAST  . lisinopril-hydrochlorothiazide (PRINZIDE,ZESTORETIC) 20-25 MG tablet TAKE ONE TABLET BY MOUTH ONCE DAILY  . Multiple Vitamins-Iron (MULTIVITAMINS WITH IRON) TABS tablet Take 1 tablet by mouth daily.  . rosuvastatin (CRESTOR) 5 MG tablet Take 1 tablet (5 mg total) by mouth daily.  . traZODone (DESYREL) 150 MG tablet as needed.  Marland Kitchen. VYVANSE 20 MG capsule Take 20 mg by mouth daily.   No facility-administered encounter medications on file as of 10/10/2015.    Review of Systems  Constitutional: Negative for appetite change and unexpected weight change.  HENT: Negative for congestion and sinus pressure.   Respiratory: Negative for cough, chest tightness and shortness of breath.   Cardiovascular: Negative for chest pain, palpitations and leg swelling.  Gastrointestinal: Negative for nausea, vomiting, abdominal pain and diarrhea.  Genitourinary: Negative for dysuria and difficulty urinating.  Neurological: Negative for dizziness, light-headedness and headaches.  Psychiatric/Behavioral: Negative for dysphoric mood and agitation.  Seeing psychiatry.         Objective:    Physical Exam  Constitutional: She appears well-developed and well-nourished. No distress.  HENT:  Nose: Nose normal.  Mouth/Throat: Oropharynx is clear and moist.  Neck: Neck supple. No thyromegaly present.  Cardiovascular: Normal rate and regular rhythm.   Pulmonary/Chest: Breath sounds normal. No respiratory distress. She has no wheezes.  Abdominal: Soft. Bowel sounds are normal. There is no tenderness.  Musculoskeletal: She exhibits no edema or tenderness.  Lymphadenopathy:    She has no cervical adenopathy.  Skin: No rash noted. No erythema.  Psychiatric: She has a normal mood and  affect. Her behavior is normal.    BP 110/60 mmHg  Pulse 76  Temp(Src) 98.7 F (37.1 C) (Oral)  Resp 18  Ht 5\' 7"  (1.702 m)  Wt 197 lb 12 oz (89.699 kg)  BMI 30.96 kg/m2  SpO2 99%  LMP 09/01/2015 Wt Readings from Last 3 Encounters:  10/10/15 197 lb 12 oz (89.699 kg)  10/03/15 199 lb 11.2 oz (90.583 kg)  09/04/15 208 lb (94.348 kg)     Lab Results  Component Value Date   WBC 6.0 08/17/2015   HGB 11.7* 08/17/2015   HCT 36.3 08/17/2015   PLT 295.0 08/17/2015   GLUCOSE 95 08/17/2015   CHOL 257* 08/17/2015   TRIG 120.0 08/17/2015   HDL 62.50 08/17/2015   LDLDIRECT 178.3 11/11/2013   LDLCALC 171* 08/17/2015   ALT 14 08/17/2015   AST 14 08/17/2015   NA 136 08/17/2015   K 4.0 08/17/2015   CL 101 08/17/2015   CREATININE 1.04 08/17/2015   BUN 10 08/17/2015   CO2 28 08/17/2015   TSH 1.85 08/17/2015    Mm Digital Screening Bilateral  09/15/2015  CLINICAL DATA:  Screening. EXAM: DIGITAL SCREENING BILATERAL MAMMOGRAM WITH CAD COMPARISON:  Previous exam(s). ACR Breast Density Category b: There are scattered areas of fibroglandular density. FINDINGS: There are no findings suspicious for malignancy. Images were processed with CAD. IMPRESSION: No mammographic evidence of malignancy. A result letter of this screening mammogram will be mailed directly to the patient. RECOMMENDATION: Screening mammogram in one year. (Code:SM-B-01Y) BI-RADS CATEGORY  1: Negative. Electronically Signed   By: Bary Richard M.D.   On: 09/15/2015 09:40       Assessment & Plan:   Problem List Items Addressed This Visit    None       Dale Aceitunas, MD

## 2015-10-11 LAB — HEPATIC FUNCTION PANEL
ALT: 19 U/L (ref 0–35)
AST: 17 U/L (ref 0–37)
Albumin: 4.4 g/dL (ref 3.5–5.2)
Alkaline Phosphatase: 38 U/L — ABNORMAL LOW (ref 39–117)
BILIRUBIN DIRECT: 0.1 mg/dL (ref 0.0–0.3)
BILIRUBIN TOTAL: 0.3 mg/dL (ref 0.2–1.2)
Total Protein: 7.7 g/dL (ref 6.0–8.3)

## 2015-10-12 ENCOUNTER — Encounter: Payer: Self-pay | Admitting: Internal Medicine

## 2015-10-16 ENCOUNTER — Encounter: Payer: Self-pay | Admitting: Internal Medicine

## 2015-10-16 NOTE — Assessment & Plan Note (Signed)
S/p gastric surgery.  Follow cbc.  

## 2015-10-16 NOTE — Assessment & Plan Note (Signed)
Has adjusted her diet.  Lost weight.  Has done well.  Discussed diet and exercise.

## 2015-10-16 NOTE — Assessment & Plan Note (Signed)
Recheck liver panel today.  On crestor.  Follow.  Has lost weight.

## 2015-10-16 NOTE — Assessment & Plan Note (Signed)
S/p gastric surgery.  Has adjusted her diet.  Lost weight.  Doing well.  Feels better.  Follow.

## 2015-10-16 NOTE — Assessment & Plan Note (Signed)
On crestor now.  Tolerating.  Follow lipid panel and liver function tests.

## 2015-10-16 NOTE — Assessment & Plan Note (Signed)
Seeing psychiatry.  On wellbutrin and trazodone.  Follow.

## 2015-10-16 NOTE — Assessment & Plan Note (Signed)
On thyroid replacement.  Follow tsh.  

## 2015-10-16 NOTE — Assessment & Plan Note (Signed)
Better on trazodone.  Sleeping better.  Follow.

## 2015-10-16 NOTE — Assessment & Plan Note (Signed)
Blood pressure running on the low side.  She has lost weight.  Will adjust the lisinopril/hctz.  She will try cutting the pill in 1/2 first.  Follow pressures.  Notify me of readings.  May change to just lisinopril.  Follow.

## 2015-10-24 ENCOUNTER — Other Ambulatory Visit: Payer: Self-pay | Admitting: Internal Medicine

## 2015-12-18 ENCOUNTER — Ambulatory Visit
Admission: RE | Admit: 2015-12-18 | Discharge: 2015-12-18 | Disposition: A | Discharge status: Home / Self Care | Payer: Managed Care, Other (non HMO) | Source: Ambulatory Visit | Attending: Internal Medicine | Admitting: Internal Medicine

## 2015-12-18 ENCOUNTER — Ambulatory Visit (INDEPENDENT_AMBULATORY_CARE_PROVIDER_SITE_OTHER): Payer: Managed Care, Other (non HMO) | Admitting: Internal Medicine

## 2015-12-18 ENCOUNTER — Encounter: Payer: Self-pay | Admitting: Internal Medicine

## 2015-12-18 ENCOUNTER — Telehealth: Payer: Self-pay

## 2015-12-18 VITALS — BP 110/70 | HR 70 | Temp 98.5°F | Resp 18 | Ht 67.0 in | Wt 199.2 lb

## 2015-12-18 DIAGNOSIS — Z9884 Bariatric surgery status: Secondary | ICD-10-CM

## 2015-12-18 DIAGNOSIS — M7989 Other specified soft tissue disorders: Secondary | ICD-10-CM

## 2015-12-18 DIAGNOSIS — E669 Obesity, unspecified: Secondary | ICD-10-CM | POA: Diagnosis not present

## 2015-12-18 DIAGNOSIS — I10 Essential (primary) hypertension: Secondary | ICD-10-CM | POA: Diagnosis not present

## 2015-12-18 DIAGNOSIS — F330 Major depressive disorder, recurrent, mild: Secondary | ICD-10-CM

## 2015-12-18 DIAGNOSIS — E039 Hypothyroidism, unspecified: Secondary | ICD-10-CM | POA: Diagnosis not present

## 2015-12-18 DIAGNOSIS — Z0000 Encounter for general adult medical examination without abnormal findings: Secondary | ICD-10-CM

## 2015-12-18 DIAGNOSIS — D649 Anemia, unspecified: Secondary | ICD-10-CM

## 2015-12-18 DIAGNOSIS — R7989 Other specified abnormal findings of blood chemistry: Secondary | ICD-10-CM

## 2015-12-18 DIAGNOSIS — E7800 Pure hypercholesterolemia, unspecified: Secondary | ICD-10-CM

## 2015-12-18 DIAGNOSIS — R945 Abnormal results of liver function studies: Secondary | ICD-10-CM

## 2015-12-18 MED ORDER — HYDROCHLOROTHIAZIDE 25 MG PO TABS: 25.0000 mg | ORAL_TABLET | Freq: Every day | ORAL | Status: DC

## 2015-12-18 MED ORDER — LISINOPRIL 20 MG PO TABS: 20.0000 mg | ORAL_TABLET | Freq: Every day | ORAL | Status: DC

## 2015-12-18 NOTE — Progress Notes (Signed)
Pre-visit discussion using our clinic review tool. No additional management support is needed unless otherwise documented below in the visit note.  

## 2015-12-18 NOTE — Telephone Encounter (Signed)
Pt notified.  Will keep me posted on progress.  Arm is better.

## 2015-12-18 NOTE — Progress Notes (Signed)
Patient ID: [name] Nielsen, female   DOB: 11-Apr-1976, 40 y.o.   MRN: [id number]   Subjective:    Patient ID: [name] DoloresElizabeth Nielsen, female    DOB: 11-Apr-1976, 40 y.o.   MRN: [id number]  HPI  Patient here for a physical exam.  States she went bowling last week.  Noticed that her arm was swollen over the weekend.  Noticed numb feeling in her fingertips.  Pain in base of thumb.  No pain extending up ar.  No pain in shoulder.  No neck pain.  No other injury or trauma.  Swelling is better now.  Eating and drinking well.  Discussed diet and exercise.  She is maintaining her weight loss.  Tries to stay active.  No cardiac symptoms with increased activity or exertion.  No sob.  No abdominal pain or cramping.  Bowels stable.  Taking 20/25 lisinopril/hctz.  Tried to 1/2 dose previously.     Past Medical History  Diagnosis Date  . Depression   . Hypertension   . Hypercholesterolemia   . Frequent headaches     hx of  . Heart murmur   . Hypothyroidism   . Hx: UTI (urinary tract infection)     child, s/p urinary procecure  . Anemia     hx of   . Anxiety    Past Surgical History  Procedure Laterality Date  . Cholecystectomy  2012  . Tubal ligation  1999  . Laparoscopic gastric sleeve resection N/A 07/05/2014    Procedure: LAPAROSCOPIC GASTRIC SLEEVE RESECTION with upper endoscopy;  Surgeon: Wenda LowMatt Martin, MD;  Location: [facility];  Service: General;  Laterality: N/A;   Family History  Problem Relation Age of Onset  . Cancer Mother     brain  . Diabetes Father   . Hypertension Father   . Kidney disease Father   . Stroke Father   . Cancer Father     prostate   Social History   Social History  . Marital Status: Single    Spouse Name: N/A  . Number of Children: N/A  . Years of Education: N/A   Social History Main Topics  . Smoking status: Never Smoker   . Smokeless tobacco: Never Used  . Alcohol Use: No  . Drug Use: No  . Sexual Activity: Not Asked   Other Topics Concern  . None   Social  History Narrative    Outpatient Encounter Prescriptions as of 12/18/2015  Medication Sig  . acetaminophen (TYLENOL) 500 MG tablet Take 1,000 mg by mouth every 6 (six) hours as needed for mild pain or moderate pain.  Marland Kitchen. buPROPion (WELLBUTRIN SR) 100 MG 12 hr tablet Take 100 mg by mouth 2 (two) times daily.  . Cyanocobalamin (B-12 PO) Take by mouth.  . levothyroxine (SYNTHROID, LEVOTHROID) 150 MCG tablet TAKE ONE TABLET BY MOUTH ONCE DAILY BEFORE BREAKFAST  . Multiple Vitamins-Iron (MULTIVITAMINS WITH IRON) TABS tablet Take 1 tablet by mouth daily.  . rosuvastatin (CRESTOR) 5 MG tablet Take 1 tablet (5 mg total) by mouth daily.  . traZODone (DESYREL) 150 MG tablet as needed.  Marland Kitchen. VYVANSE 20 MG capsule Take 20 mg by mouth daily.  . [DISCONTINUED] lisinopril-hydrochlorothiazide (PRINZIDE,ZESTORETIC) 20-25 MG tablet TAKE ONE TABLET BY MOUTH ONCE DAILY  . hydrochlorothiazide (HYDRODIURIL) 25 MG tablet Take 1 tablet (25 mg total) by mouth daily.  Marland Kitchen. lisinopril (PRINIVIL,ZESTRIL) 20 MG tablet Take 1 tablet (20 mg total) by mouth daily.   No facility-administered encounter medications on file as of 12/18/2015.  Review of Systems  Constitutional: Negative for appetite change and unexpected weight change.  HENT: Negative for congestion and sinus pressure.   Eyes: Negative for pain and visual disturbance.  Respiratory: Negative for cough, chest tightness and shortness of breath.   Cardiovascular: Negative for chest pain, palpitations and leg swelling.  Gastrointestinal: Negative for nausea, vomiting, abdominal pain and diarrhea.  Genitourinary: Negative for dysuria and difficulty urinating.  Musculoskeletal: Negative for back pain and joint swelling.       Increased pain base of thumb.  No further numbness.  Increased swelling.    Skin: Negative for color change and rash.  Neurological: Negative for dizziness and headaches.  Hematological: Negative for adenopathy. Does not bruise/bleed easily.    Psychiatric/Behavioral: Negative for dysphoric mood and agitation.       Objective:    Physical Exam  Constitutional: She is oriented to person, place, and time. She appears well-developed and well-nourished. No distress.  HENT:  Nose: Nose normal.  Mouth/Throat: Oropharynx is clear and moist.  Eyes: Right eye exhibits no discharge. Left eye exhibits no discharge. No scleral icterus.  Neck: Neck supple. No thyromegaly present.  Cardiovascular: Normal rate and regular rhythm.   Pulmonary/Chest: Breath sounds normal. No accessory muscle usage. No tachypnea. No respiratory distress. She has no decreased breath sounds. She has no wheezes. She has no rhonchi. Right breast exhibits no inverted nipple, no mass, no nipple discharge and no tenderness (no axillary adenopathy). Left breast exhibits no inverted nipple, no mass, no nipple discharge and no tenderness (no axilarry adenopathy).  Abdominal: Soft. Bowel sounds are normal. There is no tenderness.  Genitourinary:  Increased swelling right upper extremity.  DP pulse equal bilaterally.    Musculoskeletal: She exhibits no edema or tenderness.  Lymphadenopathy:    She has no cervical adenopathy.  Neurological: She is alert and oriented to person, place, and time.  Skin: Skin is warm. No rash noted. No erythema.  Psychiatric: She has a normal mood and affect. Her behavior is normal.    BP 110/70 mmHg  Pulse 70  Temp(Src) 98.5 F (36.9 C) (Oral)  Resp 18  Ht  (1.702 m)  Wt 199 lb 4 oz (90.379 kg)  BMI 31.20 kg/m2  SpO2 98%  LMP 11/21/2015 (Exact Date) Wt Readings from Last 3 Encounters:  12/18/15 199 lb 4 oz (90.379 kg)  10/10/15 197 lb 12 oz (89.699 kg)  10/03/15 199 lb 11.2 oz (90.583 kg)     Lab Results  Component Value Date   WBC 6.0 08/17/2015   HGB 11.7* 08/17/2015   HCT 36.3 08/17/2015   PLT 295.0 08/17/2015   GLUCOSE 95 08/17/2015   CHOL 257* 08/17/2015   TRIG 120.0 08/17/2015   HDL 62.50 08/17/2015    LDLDIRECT 178.3 11/11/2013   LDLCALC 171* 08/17/2015   ALT 19 10/10/2015   AST 17 10/10/2015   NA 136 08/17/2015   K 4.0 08/17/2015   CL 101 08/17/2015   CREATININE 1.04 08/17/2015   BUN 10 08/17/2015   CO2 28 08/17/2015   TSH 1.85 08/17/2015    Mm Digital Screening Bilateral  09/15/2015  CLINICAL DATA:  Screening. EXAM: DIGITAL SCREENING BILATERAL MAMMOGRAM WITH CAD COMPARISON:  Previous exam(s). ACR Breast Density Category b: There are scattered areas of fibroglandular density. FINDINGS: There are no findings suspicious for malignancy. Images were processed with CAD. IMPRESSION: No mammographic evidence of malignancy. A result letter of this screening mammogram will be mailed directly to the patient. RECOMMENDATION: Screening mammogram  in one year. (Code:SM-B-01Y) BI-RADS CATEGORY  1: Negative. Electronically Signed   By: Bary Richard M.D.   On: 09/15/2015 09:40       Assessment & Plan:   Problem List Items Addressed This Visit    Abnormal liver function test    Continue diet and exercise.  Follow liver panel.        Relevant Orders   Hepatic function panel   Anemia    S/p gastric surgery.  Follow cbc.        Relevant Orders   CBC with Differential/Platelet   Ferritin   Arm swelling - Primary    Right arm swelling as outlined.  Check upper extremity ultrasound to confirm no clot.  Follow.        Relevant Orders   US Venous Img Upper Uni Right (Completed)   Essential hypertension, benign    Will try to adjust medication.  Start lisinopril  q day.  Change hctz to  1/2 tablet q day.  Follow pressures.  Continue to adjust medication as tolerated.        Relevant Medications   lisinopril (PRINIVIL,ZESTRIL) 20 MG tablet   hydrochlorothiazide (HYDRODIURIL) 25 MG tablet   Other Relevant Orders   Basic metabolic panel   Health care maintenance    Physical today 12/18/15.  Mammogram 09/15/15 - Birads I.  PAP 07/05/13 - negative with negative HPV.        Hypercholesterolemia    On crestor.  Low cholesterol diet and exercise.  Follow lipid panel and liver function tests.        Relevant Medications   lisinopril (PRINIVIL,ZESTRIL) 20 MG tablet   hydrochlorothiazide (HYDRODIURIL) 25 MG tablet   Other Relevant Orders   Lipid panel   Hypothyroidism    On thyroid replacement.  Follow tsh.        Major depressive disorder, recurrent episode [facility])    Seeing psychiatry.  Doing well on wellbutrin and trazodone.        Obesity (BMI 30-39.9)    Continue diet and exercise.        Status post laparoscopic sleeve gastrectomy    Has lost weight.  Adjusted her diet.  Follow.          Dale [place], MD

## 2015-12-18 NOTE — Telephone Encounter (Signed)
[facility],  Stat read of right extremity ultrasound.   Negative for DVT.   Please advise,   Patient left and will need a call. thanks

## 2015-12-25 ENCOUNTER — Other Ambulatory Visit (INDEPENDENT_AMBULATORY_CARE_PROVIDER_SITE_OTHER): Payer: Managed Care, Other (non HMO)

## 2015-12-25 ENCOUNTER — Encounter: Payer: Self-pay | Admitting: Internal Medicine

## 2015-12-25 DIAGNOSIS — E7800 Pure hypercholesterolemia, unspecified: Secondary | ICD-10-CM | POA: Diagnosis not present

## 2015-12-25 DIAGNOSIS — I10 Essential (primary) hypertension: Secondary | ICD-10-CM

## 2015-12-25 DIAGNOSIS — R7989 Other specified abnormal findings of blood chemistry: Secondary | ICD-10-CM | POA: Diagnosis not present

## 2015-12-25 DIAGNOSIS — D649 Anemia, unspecified: Secondary | ICD-10-CM | POA: Diagnosis not present

## 2015-12-25 DIAGNOSIS — R945 Abnormal results of liver function studies: Secondary | ICD-10-CM

## 2015-12-25 DIAGNOSIS — M7989 Other specified soft tissue disorders: Secondary | ICD-10-CM | POA: Insufficient documentation

## 2015-12-25 LAB — LIPID PANEL
CHOLESTEROL: 180 mg/dL (ref 0–200)
HDL: 65.2 mg/dL (ref >39.00)
LDL CALC: 98 mg/dL (ref 0–99)
NonHDL: 114.61
TRIGLYCERIDES: 83 mg/dL (ref 0.0–149.0)
Total CHOL/HDL Ratio: 3
VLDL: 16.6 mg/dL (ref 0.0–40.0)

## 2015-12-25 LAB — HEPATIC FUNCTION PANEL
ALBUMIN: 4.5 g/dL (ref 3.5–5.2)
ALT: 18 U/L (ref 0–35)
AST: 19 U/L (ref 0–37)
Alkaline Phosphatase: 37 U/L — ABNORMAL LOW (ref 39–117)
BILIRUBIN TOTAL: 0.4 mg/dL (ref 0.2–1.2)
Bilirubin, Direct: 0.2 mg/dL (ref 0.0–0.3)
TOTAL PROTEIN: 7.7 g/dL (ref 6.0–8.3)

## 2015-12-25 LAB — CBC WITH DIFFERENTIAL/PLATELET
BASOS ABS: 0 10*3/uL (ref 0.0–0.1)
BASOS PCT: 0.9 % (ref 0.0–3.0)
EOS ABS: 0.1 10*3/uL (ref 0.0–0.7)
EOS PCT: 2.2 % (ref 0.0–5.0)
HCT: 34.1 % — ABNORMAL LOW (ref 36.0–46.0)
HEMOGLOBIN: 11 g/dL — AB (ref 12.0–15.0)
LYMPHS PCT: 40.8 % (ref 12.0–46.0)
Lymphs Abs: 2.3 10*3/uL (ref 0.7–4.0)
MCHC: 32.4 g/dL (ref 30.0–36.0)
MCV: 70.1 fl — ABNORMAL LOW (ref 78.0–100.0)
Monocytes Absolute: 0.5 10*3/uL (ref 0.1–1.0)
Monocytes Relative: 9.1 % (ref 3.0–12.0)
NEUTROS ABS: 2.6 10*3/uL (ref 1.4–7.7)
NEUTROS PCT: 47 % (ref 43.0–77.0)
Platelets: 312 10*3/uL (ref 150.0–400.0)
RBC: 4.86 Mil/uL (ref 3.87–5.11)
RDW: 14 % (ref 11.5–15.5)
WBC: 5.6 10*3/uL (ref 4.0–10.5)

## 2015-12-25 LAB — BASIC METABOLIC PANEL
BUN: 11 mg/dL (ref 6–23)
CALCIUM: 9.6 mg/dL (ref 8.4–10.5)
CO2: 27 mEq/L (ref 19–32)
Chloride: 99 mEq/L (ref 96–112)
Creatinine, Ser: 0.93 mg/dL (ref 0.40–1.20)
GFR: 70.21 mL/min (ref >60.00)
GLUCOSE: 93 mg/dL (ref 70–99)
Potassium: 3.2 mEq/L — ABNORMAL LOW (ref 3.5–5.1)
SODIUM: 135 meq/L (ref 135–145)

## 2015-12-25 LAB — FERRITIN: FERRITIN: 5.4 ng/mL — AB (ref 10.0–291.0)

## 2015-12-25 NOTE — Assessment & Plan Note (Signed)
Will try to adjust medication.  Start lisinopril 20mg  q day.  Change hctz to 25mg  1/2 tablet q day.  Follow pressures.  Continue to adjust medication as tolerated.

## 2015-12-25 NOTE — Assessment & Plan Note (Signed)
Continue diet and exercise. Follow liver panel.  

## 2015-12-25 NOTE — Assessment & Plan Note (Signed)
Physical today 12/18/15.  Mammogram 09/15/15 - Birads I.  PAP 07/05/13 - negative with negative HPV.

## 2015-12-25 NOTE — Assessment & Plan Note (Signed)
Continue diet and exercise. 

## 2015-12-25 NOTE — Assessment & Plan Note (Signed)
Has lost weight.  Adjusted her diet.  Follow.

## 2015-12-25 NOTE — Assessment & Plan Note (Signed)
On crestor.  Low cholesterol diet and exercise.  Follow lipid panel and liver function tests.   

## 2015-12-25 NOTE — Assessment & Plan Note (Signed)
Right arm swelling as outlined.  Check upper extremity ultrasound to confirm no clot.  Follow.

## 2015-12-25 NOTE — Assessment & Plan Note (Signed)
On thyroid replacement.  Follow tsh.  

## 2015-12-25 NOTE — Assessment & Plan Note (Signed)
S/p gastric surgery.  Follow cbc.  

## 2015-12-25 NOTE — Assessment & Plan Note (Signed)
Seeing psychiatry.  Doing well on wellbutrin and trazodone.

## 2015-12-26 ENCOUNTER — Encounter: Payer: Self-pay | Admitting: *Deleted

## 2015-12-26 ENCOUNTER — Telehealth: Payer: Self-pay | Admitting: Internal Medicine

## 2015-12-26 ENCOUNTER — Other Ambulatory Visit: Payer: Self-pay | Admitting: Internal Medicine

## 2015-12-26 ENCOUNTER — Encounter: Payer: Self-pay | Admitting: Internal Medicine

## 2015-12-26 DIAGNOSIS — M7989 Other specified soft tissue disorders: Secondary | ICD-10-CM

## 2015-12-26 DIAGNOSIS — E876 Hypokalemia: Secondary | ICD-10-CM

## 2015-12-26 MED ORDER — ALPRAZOLAM 0.25 MG PO TABS: 0.2500 mg | ORAL_TABLET | Freq: Every day | ORAL | Status: DC | PRN

## 2015-12-26 NOTE — Telephone Encounter (Signed)
rx ok'd for xanax #15 with no refills per pts my chart request.  Message sent to her regarding referral to vascular for her arm swelling.

## 2015-12-26 NOTE — Telephone Encounter (Signed)
Message sent to pt about desire for vascular surgery referral and also to notify of xanax rx.

## 2015-12-26 NOTE — Telephone Encounter (Signed)
Rx faxed

## 2015-12-26 NOTE — Progress Notes (Signed)
Order placed for f/u potassium.  

## 2015-12-27 ENCOUNTER — Other Ambulatory Visit: Payer: Self-pay | Admitting: Internal Medicine

## 2015-12-27 MED ORDER — INTEGRA 62.5-62.5-40-3 MG PO CAPS: 1.0000 | ORAL_CAPSULE | Freq: Every day | ORAL | Status: DC

## 2015-12-27 NOTE — Telephone Encounter (Signed)
Order placed for vascular surgery referral.   

## 2015-12-27 NOTE — Addendum Note (Signed)
Addended by: Charm BargesSCOTT, Damisha Wolff S on: 12/27/2015 05:12 AM   Modules accepted: Orders

## 2015-12-27 NOTE — Progress Notes (Signed)
rx sent in for integra #30 with 2 refills.   

## 2016-01-04 ENCOUNTER — Other Ambulatory Visit: Payer: Self-pay | Admitting: *Deleted

## 2016-01-04 DIAGNOSIS — M7989 Other specified soft tissue disorders: Secondary | ICD-10-CM

## 2016-01-05 ENCOUNTER — Other Ambulatory Visit (INDEPENDENT_AMBULATORY_CARE_PROVIDER_SITE_OTHER): Payer: Managed Care, Other (non HMO)

## 2016-01-05 DIAGNOSIS — E876 Hypokalemia: Secondary | ICD-10-CM | POA: Diagnosis not present

## 2016-01-05 LAB — POTASSIUM: POTASSIUM: 3.7 meq/L (ref 3.5–5.1)

## 2016-01-06 ENCOUNTER — Encounter: Payer: Self-pay | Admitting: Internal Medicine

## 2016-01-09 ENCOUNTER — Encounter: Payer: Self-pay | Admitting: Vascular Surgery

## 2016-01-12 ENCOUNTER — Ambulatory Visit (HOSPITAL_COMMUNITY)
Admission: RE | Admit: 2016-01-12 | Discharge: 2016-01-12 | Disposition: A | Discharge status: Home / Self Care | Payer: Managed Care, Other (non HMO) | Source: Ambulatory Visit | Attending: Vascular Surgery | Admitting: Vascular Surgery

## 2016-01-12 DIAGNOSIS — M7989 Other specified soft tissue disorders: Secondary | ICD-10-CM | POA: Diagnosis not present

## 2016-01-12 DIAGNOSIS — M79601 Pain in right arm: Secondary | ICD-10-CM | POA: Insufficient documentation

## 2016-01-18 ENCOUNTER — Encounter: Payer: Self-pay | Admitting: Vascular Surgery

## 2016-01-18 ENCOUNTER — Ambulatory Visit (INDEPENDENT_AMBULATORY_CARE_PROVIDER_SITE_OTHER): Payer: Managed Care, Other (non HMO) | Admitting: Vascular Surgery

## 2016-01-18 VITALS — BP 114/71 | HR 90 | Ht 67.0 in | Wt 206.9 lb

## 2016-01-18 DIAGNOSIS — R609 Edema, unspecified: Secondary | ICD-10-CM | POA: Diagnosis not present

## 2016-01-18 DIAGNOSIS — R600 Localized edema: Secondary | ICD-10-CM

## 2016-01-18 NOTE — Progress Notes (Signed)
Referring Physician: Dale Fieldale, MD  Patient name: [name] Nielsen MRN: [id number] DOB: 1976-02-03 Sex: female  REASON FOR CONSULT: Right arm swelling  HPI: [name] Nielsen is a 40 y.o. female,  sent for evaluation of right upper extremity swelling. Approximate 1 month ago the patient began to have some numbness and tingling in her middle 3 digits right hand. She then developed swelling from the elbow down into the hand. This has continued to improve since then but has not completely resolved. She denies any previous central vein manipulation. She states she gets regular mammograms and has not had any abnormal mammograms recently. She denies prior history of DVT. She has no history of hypercoagulable state. She has no family history of problems such as this. She says that the right arm is still about 5% bigger than the left but has improved significantly. She had a duplex ultrasound recently 12/18/2015 which showed no evidence of DVT in the right arm. Other medical problems include depression, hypertension, hypercholesterolemia, headaches all of which are been stable.  Past Medical History  Diagnosis Date  . Depression   . Hypertension   . Hypercholesterolemia   . Frequent headaches     hx of  . Heart murmur   . Hypothyroidism   . Hx: UTI (urinary tract infection)     child, s/p urinary procecure  . Anemia     hx of   . Anxiety    Past Surgical History  Procedure Laterality Date  . Cholecystectomy  2012  . Tubal ligation  1999  . Laparoscopic gastric sleeve resection N/A 07/05/2014    Procedure: LAPAROSCOPIC GASTRIC SLEEVE RESECTION with upper endoscopy;  Surgeon: Wenda Low, MD;  Location: [facility];  Service: General;  Laterality: N/A;    Family History  Problem Relation Age of Onset  . Cancer Mother     brain  . Diabetes Father   . Hypertension Father   . Kidney disease Father   . Stroke Father   . Cancer Father     prostate    SOCIAL HISTORY: Social History    Social History  . Marital Status: Single    Spouse Name: N/A  . Number of Children: N/A  . Years of Education: N/A   Occupational History  . Not on file.   Social History Main Topics  . Smoking status: Never Smoker   . Smokeless tobacco: Never Used  . Alcohol Use: No  . Drug Use: No  . Sexual Activity: Not on file   Other Topics Concern  . Not on file   Social History Narrative    No Known Allergies  Current Outpatient Prescriptions  Medication Sig Dispense Refill  . acetaminophen (TYLENOL) 500 MG tablet Take 1,000 mg by mouth every 6 (six) hours as needed for mild pain or moderate pain.    Marland Kitchen ALPRAZolam (XANAX) 0.25 MG tablet Take 1 tablet (0.25 mg total) by mouth daily as needed for anxiety. 15 tablet 0  . buPROPion (WELLBUTRIN SR) 100 MG 12 hr tablet Take 100 mg by mouth 2 (two) times daily.    . Cyanocobalamin (B-12 PO) Take by mouth.    . Fe Fum-FePoly-Vit C-Vit B3 (INTEGRA) 62.5-62.5-40-3 MG CAPS Take 1 capsule by mouth daily. 30 capsule 2  . hydrochlorothiazide (HYDRODIURIL) 25 MG tablet Take 1 tablet (25 mg total) by mouth daily. 30 tablet 3  . levothyroxine (SYNTHROID, LEVOTHROID) 150 MCG tablet TAKE ONE TABLET BY MOUTH ONCE DAILY BEFORE BREAKFAST 30 tablet 3  .  lisinopril (PRINIVIL,ZESTRIL) 20 MG tablet Take 1 tablet (20 mg total) by mouth daily. 30 tablet 3  . Multiple Vitamins-Iron (MULTIVITAMINS WITH IRON) TABS tablet Take 1 tablet by mouth daily.    . rosuvastatin (CRESTOR) 5 MG tablet Take 1 tablet (5 mg total) by mouth daily. 90 tablet 3  . traZODone (DESYREL) 150 MG tablet as needed.    Marland Kitchen. VYVANSE 20 MG capsule Take 20 mg by mouth daily. Reported on 01/18/2016     No current facility-administered medications for this visit.    ROS:   General:  No weight loss, Fever, chills  HEENT: No recent headaches, no nasal bleeding, no visual changes, no sore throat  Neurologic: No dizziness, blackouts, seizures. No recent symptoms of stroke or mini- stroke. No  recent episodes of slurred speech, or temporary blindness.  Cardiac: No recent episodes of chest pain/pressure, no shortness of breath at rest.  No shortness of breath with exertion.  Denies history of atrial fibrillation or irregular heartbeat  Vascular: No history of rest pain in feet.  No history of claudication.  No history of non-healing ulcer, No history of DVT   Pulmonary: No home oxygen, no productive cough, no hemoptysis,  No asthma or wheezing  Musculoskeletal:  [ ]  Arthritis, [ ]  Low back pain,  [ ]  Joint pain  Hematologic:No history of hypercoagulable state.  No history of easy bleeding.  No history of anemia  Gastrointestinal: No hematochezia or melena,  No gastroesophageal reflux, no trouble swallowing  Urinary: [ ]  chronic Kidney disease, [ ]  on HD - [ ]  MWF or [ ]  TTHS, [ ]  Burning with urination, [ ]  Frequent urination, [ ]  Difficulty urinating;   Skin: No rashes  Psychological: No history of anxiety,  No history of depression   Physical Examination  Filed Vitals:   01/18/16 1447  BP: 114/71  Pulse: 90  Height: 5\' 7"  (1.702 m)  Weight: 206 lb 14.4 oz (93.849 kg)  SpO2: 98%    Body mass index is 32.4 kg/(m^2).  General:  Alert and oriented, no acute distress HEENT: Normal Neck: No bruit or JVD Pulmonary: Clear to auscultation bilaterally, examination of the right axilla shows no palpable lymph nodes no chest wall tenderness Cardiac: Regular Rate and Rhythm without murmur Abdomen: Soft, non-tender, non-distended, no mass, no scars Skin: No rash Extremity Pulses:  2+ radial, brachial pulses bilaterally, subtle swelling right dorsal forearm approximately 5% larger than the left nonpitting no extension into the hand Musculoskeletal: No deformity or edema  Neurologic: Upper and lower extremity motor 5/5 and symmetric  DATA:  Recent duplex ultrasound the right upper extremity was reviewed. This shows no evidence of DVT.  ASSESSMENT: Right arm swelling unknown  etiology but overall improving   PLAN:  Patient encouraged to continue follow-up with her mammograms and breast exam with her primary. She will follow-up with me if the swelling recurs at that point we will consider whether or not to perform CT scan or central venogram. However, it does not appear that her veins are involved in this process. She also has normal arterial exam.   Fabienne Brunsharles Karel Mowers, MD [facility] [place] Office: [phone] Pager: [phone]

## 2016-02-05 ENCOUNTER — Other Ambulatory Visit: Payer: Self-pay | Admitting: Internal Medicine

## 2016-03-08 ENCOUNTER — Ambulatory Visit (INDEPENDENT_AMBULATORY_CARE_PROVIDER_SITE_OTHER): Payer: Managed Care, Other (non HMO) | Admitting: Internal Medicine

## 2016-03-08 ENCOUNTER — Encounter: Payer: Self-pay | Admitting: Internal Medicine

## 2016-03-08 VITALS — BP 100/64 | HR 86 | Temp 99.2°F | Wt 209.0 lb

## 2016-03-08 DIAGNOSIS — D649 Anemia, unspecified: Secondary | ICD-10-CM | POA: Diagnosis not present

## 2016-03-08 DIAGNOSIS — I10 Essential (primary) hypertension: Secondary | ICD-10-CM | POA: Diagnosis not present

## 2016-03-08 DIAGNOSIS — E039 Hypothyroidism, unspecified: Secondary | ICD-10-CM | POA: Diagnosis not present

## 2016-03-08 DIAGNOSIS — E7800 Pure hypercholesterolemia, unspecified: Secondary | ICD-10-CM | POA: Diagnosis not present

## 2016-03-08 DIAGNOSIS — R945 Abnormal results of liver function studies: Secondary | ICD-10-CM

## 2016-03-08 DIAGNOSIS — R7989 Other specified abnormal findings of blood chemistry: Secondary | ICD-10-CM

## 2016-03-08 DIAGNOSIS — E669 Obesity, unspecified: Secondary | ICD-10-CM

## 2016-03-08 DIAGNOSIS — M7989 Other specified soft tissue disorders: Secondary | ICD-10-CM

## 2016-03-08 DIAGNOSIS — F330 Major depressive disorder, recurrent, mild: Secondary | ICD-10-CM

## 2016-03-08 DIAGNOSIS — Z9884 Bariatric surgery status: Secondary | ICD-10-CM

## 2016-03-08 NOTE — Progress Notes (Signed)
Patient ID: [name] Nielsen, female   DOB: March 13, 1976, 40 y.o.   MRN: [id number]   Subjective:    Patient ID: [name] DoloresElizabeth Wassenaar, female    DOB: March 13, 1976, 40 y.o.   MRN: [id number]  HPI  Patient here for a scheduled follow up.  She saw vascular surgery for her right arm swelling.  He did not feel any further w/up warranted.  No pain.  She is exercising.  Has adjusted her diet.  Is concerned because she is not losing more weight.  Discussed diet and exercise with her.  She had questions about going to a physicians weight loss program and about phentermine.  Discussed with her.  No chest pain.  No sob.  No swallowing problems.  No abdominal pain or cramping.  Bowels stable.    Past Medical History  Diagnosis Date  . Depression   . Hypertension   . Hypercholesterolemia   . Frequent headaches     hx of  . Heart murmur   . Hypothyroidism   . Hx: UTI (urinary tract infection)     child, s/p urinary procecure  . Anemia     hx of   . Anxiety    Past Surgical History  Procedure Laterality Date  . Cholecystectomy  2012  . Tubal ligation  1999  . Laparoscopic gastric sleeve resection N/A 07/05/2014    Procedure: LAPAROSCOPIC GASTRIC SLEEVE RESECTION with upper endoscopy;  Surgeon: Wenda LowMatt Martin, MD;  Location: [facility];  Service: General;  Laterality: N/A;   Family History  Problem Relation Age of Onset  . Cancer Mother     brain  . Diabetes Father   . Hypertension Father   . Kidney disease Father   . Stroke Father   . Cancer Father     prostate   Social History   Social History  . Marital Status: Single    Spouse Name: N/A  . Number of Children: N/A  . Years of Education: N/A   Social History Main Topics  . Smoking status: Never Smoker   . Smokeless tobacco: Never Used  . Alcohol Use: No  . Drug Use: No  . Sexual Activity: Not Asked   Other Topics Concern  . None   Social History Narrative    Outpatient Encounter Prescriptions as of 03/08/2016  Medication Sig  .  acetaminophen (TYLENOL) 500 MG tablet Take 1,000 mg by mouth every 6 (six) hours as needed for mild pain or moderate pain.  Marland Kitchen. ALPRAZolam (XANAX) 0.25 MG tablet Take 1 tablet (0.25 mg total) by mouth daily as needed for anxiety.  . Cyanocobalamin (B-12 PO) Take by mouth.  . Fe Fum-FePoly-Vit C-Vit B3 (INTEGRA) 62.5-62.5-40-3 MG CAPS Take 1 capsule by mouth daily.  . hydrochlorothiazide (HYDRODIURIL) 25 MG tablet Take 1 tablet (25 mg total) by mouth daily.  Marland Kitchen. levothyroxine (SYNTHROID, LEVOTHROID) 150 MCG tablet TAKE ONE TABLET BY MOUTH ONCE DAILY BEFORE BREAKFAST  . lisinopril (PRINIVIL,ZESTRIL) 20 MG tablet Take 1 tablet (20 mg total) by mouth daily.  . Multiple Vitamins-Iron (MULTIVITAMINS WITH IRON) TABS tablet Take 1 tablet by mouth daily.  . rosuvastatin (CRESTOR) 5 MG tablet Take 1 tablet (5 mg total) by mouth daily.  . traZODone (DESYREL) 150 MG tablet as needed.  Marland Kitchen. buPROPion (WELLBUTRIN SR) 100 MG 12 hr tablet Take 100 mg by mouth 2 (two) times daily. Reported on 03/08/2016  . VYVANSE 20 MG capsule Take 20 mg by mouth daily. Reported on 03/08/2016   No facility-administered encounter medications on file  as of 03/08/2016.    Review of Systems  Constitutional: Negative for appetite change and unexpected weight change.  HENT: Negative for congestion and sinus pressure.   Respiratory: Negative for cough, chest tightness and shortness of breath.   Cardiovascular: Negative for chest pain, palpitations and leg swelling.  Gastrointestinal: Negative for nausea, vomiting, abdominal pain and diarrhea.  Genitourinary: Negative for dysuria and difficulty urinating.  Musculoskeletal: Negative for back pain and joint swelling.  Skin: Negative for color change and rash.  Neurological: Negative for dizziness, light-headedness and headaches.  Psychiatric/Behavioral: Negative for dysphoric mood and agitation.       Objective:    Physical Exam  Constitutional: She appears well-developed and  well-nourished. No distress.  HENT:  Nose: Nose normal.  Mouth/Throat: Oropharynx is clear and moist.  Neck: Neck supple. No thyromegaly present.  Cardiovascular: Normal rate and regular rhythm.   Pulmonary/Chest: Breath sounds normal. No respiratory distress. She has no wheezes.  Abdominal: Soft. Bowel sounds are normal. There is no tenderness.  Musculoskeletal: She exhibits no edema or tenderness.  Lymphadenopathy:    She has no cervical adenopathy.  Skin: No rash noted. No erythema.  Psychiatric: She has a normal mood and affect. Her behavior is normal.    BP 100/64 mmHg  Pulse 86  Temp(Src) 99.2 F (37.3 C) (Oral)  Wt 209 lb (94.802 kg)  LMP 02/23/2016 (Approximate) Wt Readings from Last 3 Encounters:  03/08/16 209 lb (94.802 kg)  01/18/16 206 lb 14.4 oz (93.849 kg)  12/18/15 199 lb 4 oz (90.379 kg)     Lab Results  Component Value Date   WBC 5.6 12/25/2015   HGB 11.0* 12/25/2015   HCT 34.1* 12/25/2015   PLT 312.0 12/25/2015   GLUCOSE 93 12/25/2015   CHOL 180 12/25/2015   TRIG 83.0 12/25/2015   HDL 65.20 12/25/2015   LDLDIRECT 178.3 11/11/2013   LDLCALC 98 12/25/2015   ALT 18 12/25/2015   AST 19 12/25/2015   NA 135 12/25/2015   K 3.7 01/05/2016   CL 99 12/25/2015   CREATININE 0.93 12/25/2015   BUN 11 12/25/2015   CO2 27 12/25/2015   TSH 1.85 08/17/2015       Assessment & Plan:   Problem List Items Addressed This Visit    Abnormal liver function test    Continue diet and exercise.  Follow liver panel.       Relevant Orders   Hepatic function panel   Anemia    S/p gastric surgery.  Last hgb 11.0.  Recheck cbc and iron studies.       Relevant Orders   CBC with Differential/Platelet   Ferritin   Vitamin B12   IBC panel   Arm swelling    Saw vascular surgery.  Felt no further w/up warranted.  Follow.       Essential hypertension, benign - Primary    Blood pressure under good control.  Continue same medication regimen.  Follow pressures.   Follow metabolic panel.        Relevant Orders   Basic metabolic panel   Hypercholesterolemia    On crestor.  Low cholesterol diet and exercise.  Follow lipid panel and liver function tests.       Relevant Orders   Lipid panel   Hypothyroidism    On thyroid replacement.  Follow tsh        Major depressive disorder, recurrent episode (HCC)    Has seen psychiatry.  Doing well on current regimen.  Follow.  Obesity (BMI 30-39.9)    Discussed diet and exercise.  She had questions about weight loss programs and phentermine.  Follow.        Status post laparoscopic sleeve gastrectomy    Has lost weight.  Continue diet and exercise.            Dale [place], MD

## 2016-03-08 NOTE — Progress Notes (Signed)
Pre visit review using our clinic review tool, if applicable. No additional management support is needed unless otherwise documented below in the visit note. 

## 2016-03-09 ENCOUNTER — Encounter: Payer: Self-pay | Admitting: Internal Medicine

## 2016-03-09 NOTE — Assessment & Plan Note (Signed)
On crestor.  Low cholesterol diet and exercise.  Follow lipid panel and liver function tests.   

## 2016-03-09 NOTE — Assessment & Plan Note (Signed)
Blood pressure under good control.  Continue same medication regimen.  Follow pressures.  Follow metabolic panel.   

## 2016-03-09 NOTE — Assessment & Plan Note (Signed)
Saw vascular surgery.  Felt no further w/up warranted.  Follow.

## 2016-03-09 NOTE — Assessment & Plan Note (Signed)
On thyroid replacement.  Follow tsh.  

## 2016-03-09 NOTE — Assessment & Plan Note (Signed)
S/p gastric surgery.  Last hgb 11.0.  Recheck cbc and iron studies.

## 2016-03-09 NOTE — Assessment & Plan Note (Signed)
Discussed diet and exercise.  She had questions about weight loss programs and phentermine.  Follow.

## 2016-03-09 NOTE — Assessment & Plan Note (Signed)
Continue diet and exercise. Follow liver panel.  

## 2016-03-09 NOTE — Assessment & Plan Note (Signed)
Has lost weight.  Continue diet and exercise.

## 2016-03-09 NOTE — Assessment & Plan Note (Signed)
Has seen psychiatry.  Doing well on current regimen.  Follow.

## 2016-03-20 ENCOUNTER — Other Ambulatory Visit: Payer: Self-pay | Admitting: *Deleted

## 2016-03-20 MED ORDER — LISINOPRIL 20 MG PO TABS
20.0000 mg | ORAL_TABLET | Freq: Every day | ORAL | Status: DC
Start: 2016-03-20 — End: 2016-11-25

## 2016-04-12 ENCOUNTER — Ambulatory Visit (INDEPENDENT_AMBULATORY_CARE_PROVIDER_SITE_OTHER): Payer: Managed Care, Other (non HMO) | Admitting: Family

## 2016-04-12 ENCOUNTER — Encounter: Payer: Self-pay | Admitting: Family

## 2016-04-12 VITALS — BP 96/60 | HR 69 | Temp 98.4°F | Wt 212.8 lb

## 2016-04-12 DIAGNOSIS — B029 Zoster without complications: Secondary | ICD-10-CM

## 2016-04-12 MED ORDER — VALACYCLOVIR HCL 1 G PO TABS: 1000.0000 mg | ORAL_TABLET | Freq: Three times a day (TID) | ORAL | Status: DC

## 2016-04-12 MED ORDER — CAPSAICIN 0.025 % EX CREA: TOPICAL_CREAM | Freq: Two times a day (BID) | CUTANEOUS | Status: DC

## 2016-04-12 NOTE — Progress Notes (Signed)
Subjective:    Patient ID: [name] Nielsen, female    DOB: Oct 27, 1975, 40 y.o.   MRN: [id number]   [name] Doloreslizabeth Momon is a 40 y.o. female who presents today for an acute visit.    HPI Comments: Patient here for evaluation of rash on left side of abdomen this morning. Had noted pain in this area prior to onset of rash. Describes very painful. No fever, chills. History of chickenpox as child.  Past Medical History  Diagnosis Date  . Depression   . Hypertension   . Hypercholesterolemia   . Frequent headaches     hx of  . Heart murmur   . Hypothyroidism   . Hx: UTI (urinary tract infection)     child, s/p urinary procecure  . Anemia     hx of   . Anxiety    Allergies: Review of patient's allergies indicates no known allergies. Current Outpatient Prescriptions on File Prior to Visit  Medication Sig Dispense Refill  . acetaminophen (TYLENOL) 500 MG tablet Take 1,000 mg by mouth every 6 (six) hours as needed for mild pain or moderate pain.    Marland Kitchen. ALPRAZolam (XANAX) 0.25 MG tablet Take 1 tablet (0.25 mg total) by mouth daily as needed for anxiety. 15 tablet 0  . buPROPion (WELLBUTRIN SR) 100 MG 12 hr tablet Take 100 mg by mouth 2 (two) times daily. Reported on 03/08/2016    . Cyanocobalamin (B-12 PO) Take by mouth.    . Fe Fum-FePoly-Vit C-Vit B3 (INTEGRA) 62.5-62.5-40-3 MG CAPS Take 1 capsule by mouth daily. 30 capsule 2  . hydrochlorothiazide (HYDRODIURIL) 25 MG tablet Take 1 tablet (25 mg total) by mouth daily. 30 tablet 3  . levothyroxine (SYNTHROID, LEVOTHROID) 150 MCG tablet TAKE ONE TABLET BY MOUTH ONCE DAILY BEFORE BREAKFAST 30 tablet 11  . lisinopril (PRINIVIL,ZESTRIL) 20 MG tablet Take 1 tablet (20 mg total) by mouth daily. 90 tablet 0  . Multiple Vitamins-Iron (MULTIVITAMINS WITH IRON) TABS tablet Take 1 tablet by mouth daily.    . rosuvastatin (CRESTOR) 5 MG tablet Take 1 tablet (5 mg total) by mouth daily. 90 tablet 3  . traZODone (DESYREL) 150 MG tablet as needed.    Marland Kitchen. VYVANSE  20 MG capsule Take 20 mg by mouth daily. Reported on 03/08/2016     No current facility-administered medications on file prior to visit.    Social History  Substance Use Topics  . Smoking status: Never Smoker   . Smokeless tobacco: Never Used  . Alcohol Use: No    Review of Systems  Constitutional: Negative for fever and chills.  Respiratory: Negative for cough.   Cardiovascular: Negative for chest pain and palpitations.  Gastrointestinal: Negative for nausea and vomiting.      Objective:    BP 96/60 mmHg  Pulse 69  Temp(Src) 98.4 F (36.9 C)  Wt 212 lb 12.8 oz (96.525 kg)  SpO2 99%  LMP 04/12/2016   Physical Exam  Constitutional: She appears well-developed and well-nourished.  Eyes: Conjunctivae are normal.  Cardiovascular: Normal rate, regular rhythm, normal heart sounds and normal pulses.   Pulmonary/Chest: Effort normal and breath sounds normal. She has no wheezes. She has no rhonchi. She has no rales.  Neurological: She is alert.  Skin: Skin is warm and dry.     Psychiatric: She has a normal mood and affect. Her speech is normal and behavior is normal. Thought content normal.  Vitals reviewed.      Assessment & Plan:  1. Shingles  -  valACYclovir (VALTREX) 1000 MG tablet; Take 1 tablet (1,000 mg total) by mouth 3 (three) times daily.  Dispense: 21 tablet; Refill: 0 - capsaicin (ZOSTRIX) 0.025 % cream; Apply topically 2 (two) times daily.  Dispense: 60 g; Refill: 1     I am having [name] maintain her acetaminophen, multivitamins with iron, Cyanocobalamin (B-12 PO), VYVANSE, buPROPion, traZODone, rosuvastatin, hydrochlorothiazide, ALPRAZolam, INTEGRA, levothyroxine, and lisinopril.   No orders of the defined types were placed in this encounter.    Return precautions given.   Start medications as prescribed and explained to patient on After Visit Summary ( AVS). Risks, benefits, and alternatives of the medications and treatment plan prescribed today were  discussed, and patient expressed understanding.   Education regarding symptom management and diagnosis given to patient.   Follow-up:Plan follow-up and return precautions given if any worsening symptoms or change in condition.   Continue to follow with Dale DurhamSCOTT, CHARLENE, MD for routine health maintenance.   [name] Nielsen and I agreed with plan.   Rennie PlowmanMargaret Arnett, FNP

## 2016-04-12 NOTE — Patient Instructions (Signed)
If there is no improvement in your symptoms, or if there is any worsening of symptoms, or if you have any additional concerns, please return for re-evaluation; or, if we are closed, consider going to the Emergency Room for evaluation if symptoms urgent.  Shingles Shingles, which is also known as herpes zoster, is an infection that causes a painful skin rash and fluid-filled blisters. Shingles is not related to genital herpes, which is a sexually transmitted infection.   Shingles only develops in people who:  Have had chickenpox.  Have received the chickenpox vaccine. (This is rare.) CAUSES Shingles is caused by varicella-zoster virus (VZV). This is the same virus that causes chickenpox. After exposure to VZV, the virus stays in the body in an inactive (dormant) state. Shingles develops if the virus reactivates. This can happen many years after the initial exposure to VZV. It is not known what causes this virus to reactivate. RISK FACTORS People who have had chickenpox or received the chickenpox vaccine are at risk for shingles. Infection is more common in people who:  Are older than age 50.  Have a weakened defense (immune) system, such as those with HIV, AIDS, or cancer.  Are taking medicines that weaken the immune system, such as transplant medicines.  Are under great stress. SYMPTOMS Early symptoms of this condition include itching, tingling, and pain in an area on your skin. Pain may be described as burning, stabbing, or throbbing. A few days or weeks after symptoms start, a painful red rash appears, usually on one side of the body in a bandlike or beltlike pattern. The rash eventually turns into fluid-filled blisters that break open, scab over, and dry up in about 2-3 weeks. At any time during the infection, you may also develop:  A fever.  Chills.  A headache.  An upset stomach. DIAGNOSIS This condition is diagnosed with a skin exam. Sometimes, skin or fluid samples are taken  from the blisters before a diagnosis is made. These samples are examined under a microscope or sent to a lab for testing. TREATMENT There is no specific cure for this condition. Your health care provider will probably prescribe medicines to help you manage pain, recover more quickly, and avoid long-term problems. Medicines may include:  Antiviral drugs.  Anti-inflammatory drugs.  Pain medicines. If the area involved is on your face, you may be referred to a specialist, such as an eye doctor (ophthalmologist) or an ear, nose, and throat (ENT) doctor to help you avoid eye problems, chronic pain, or disability. HOME CARE INSTRUCTIONS Medicines  Take medicines only as directed by your health care provider.  Apply an anti-itch or numbing cream to the affected area as directed by your health care provider. Blister and Rash Care  Take a cool bath or apply cool compresses to the area of the rash or blisters as directed by your health care provider. This may help with pain and itching.  Keep your rash covered with a loose bandage (dressing). Wear loose-fitting clothing to help ease the pain of material rubbing against the rash.  Keep your rash and blisters clean with mild soap and cool water or as directed by your health care provider.  Check your rash every day for signs of infection. These include redness, swelling, and pain that lasts or increases.  Do not pick your blisters.  Do not scratch your rash. General Instructions  Rest as directed by your health care provider.  Keep all follow-up visits as directed by your health care provider.   This is important.  Until your blisters scab over, your infection can cause chickenpox in people who have never had it or been vaccinated against it. To prevent this from happening, avoid contact with other people, especially:  Babies.  Pregnant women.  Children who have eczema.  Elderly people who have transplants.  People who have chronic  illnesses, such as leukemia or AIDS. SEEK MEDICAL CARE IF:  Your pain is not relieved with prescribed medicines.  Your pain does not get better after the rash heals.  Your rash looks infected. Signs of infection include redness, swelling, and pain that lasts or increases. SEEK IMMEDIATE MEDICAL CARE IF:  The rash is on your face or nose.  You have facial pain, pain around your eye area, or loss of feeling on one side of your face.  You have ear pain or you have ringing in your ear.  You have loss of taste.  Your condition gets worse.   This information is not intended to replace advice given to you by your health care provider. Make sure you discuss any questions you have with your health care provider.   Document Released: 09/23/2005 Document Revised: 10/14/2014 Document Reviewed: 08/04/2014 Elsevier Interactive Patient Education [employer]2016 Elsevier Inc.

## 2016-04-21 ENCOUNTER — Other Ambulatory Visit: Payer: Self-pay | Admitting: Internal Medicine

## 2016-06-11 ENCOUNTER — Other Ambulatory Visit: Payer: Managed Care, Other (non HMO)

## 2016-06-11 DIAGNOSIS — R945 Abnormal results of liver function studies: Secondary | ICD-10-CM

## 2016-06-11 DIAGNOSIS — E7800 Pure hypercholesterolemia, unspecified: Secondary | ICD-10-CM

## 2016-06-11 DIAGNOSIS — R7989 Other specified abnormal findings of blood chemistry: Secondary | ICD-10-CM

## 2016-06-11 DIAGNOSIS — D649 Anemia, unspecified: Secondary | ICD-10-CM

## 2016-06-11 DIAGNOSIS — I10 Essential (primary) hypertension: Secondary | ICD-10-CM

## 2016-06-11 NOTE — Addendum Note (Signed)
Addended by: Felix AhmadiFRANSEN, Joeziah Voit A on: 06/11/2016 08:59 AM   Modules accepted: Orders

## 2016-06-14 ENCOUNTER — Encounter: Payer: Self-pay | Admitting: Internal Medicine

## 2016-06-14 ENCOUNTER — Telehealth: Payer: Self-pay | Admitting: *Deleted

## 2016-06-14 ENCOUNTER — Other Ambulatory Visit (HOSPITAL_COMMUNITY)
Admission: RE | Admit: 2016-06-14 | Discharge: 2016-06-14 | Disposition: A | Discharge status: Home / Self Care | Payer: Managed Care, Other (non HMO) | Source: Ambulatory Visit | Attending: Internal Medicine | Admitting: Internal Medicine

## 2016-06-14 ENCOUNTER — Ambulatory Visit (INDEPENDENT_AMBULATORY_CARE_PROVIDER_SITE_OTHER): Payer: Managed Care, Other (non HMO) | Admitting: Internal Medicine

## 2016-06-14 VITALS — BP 104/64 | HR 77 | Temp 97.6°F | Ht 67.0 in | Wt 206.6 lb

## 2016-06-14 DIAGNOSIS — E7800 Pure hypercholesterolemia, unspecified: Secondary | ICD-10-CM

## 2016-06-14 DIAGNOSIS — Z1151 Encounter for screening for human papillomavirus (HPV): Secondary | ICD-10-CM | POA: Insufficient documentation

## 2016-06-14 DIAGNOSIS — Z0000 Encounter for general adult medical examination without abnormal findings: Secondary | ICD-10-CM

## 2016-06-14 DIAGNOSIS — E039 Hypothyroidism, unspecified: Secondary | ICD-10-CM

## 2016-06-14 DIAGNOSIS — R7989 Other specified abnormal findings of blood chemistry: Secondary | ICD-10-CM | POA: Diagnosis not present

## 2016-06-14 DIAGNOSIS — E669 Obesity, unspecified: Secondary | ICD-10-CM

## 2016-06-14 DIAGNOSIS — D649 Anemia, unspecified: Secondary | ICD-10-CM

## 2016-06-14 DIAGNOSIS — R945 Abnormal results of liver function studies: Secondary | ICD-10-CM

## 2016-06-14 DIAGNOSIS — Z124 Encounter for screening for malignant neoplasm of cervix: Secondary | ICD-10-CM | POA: Diagnosis not present

## 2016-06-14 DIAGNOSIS — Z1239 Encounter for other screening for malignant neoplasm of breast: Secondary | ICD-10-CM

## 2016-06-14 DIAGNOSIS — Z9884 Bariatric surgery status: Secondary | ICD-10-CM

## 2016-06-14 DIAGNOSIS — I10 Essential (primary) hypertension: Secondary | ICD-10-CM

## 2016-06-14 DIAGNOSIS — Z01419 Encounter for gynecological examination (general) (routine) without abnormal findings: Secondary | ICD-10-CM | POA: Insufficient documentation

## 2016-06-14 LAB — CBC WITH DIFFERENTIAL/PLATELET
BASOS ABS: 0 10*3/uL (ref 0.0–0.1)
Basophils Relative: 0.6 % (ref 0.0–3.0)
EOS ABS: 0.1 10*3/uL (ref 0.0–0.7)
Eosinophils Relative: 1.9 % (ref 0.0–5.0)
HCT: 33.6 % — ABNORMAL LOW (ref 36.0–46.0)
Hemoglobin: 11.3 g/dL — ABNORMAL LOW (ref 12.0–15.0)
LYMPHS ABS: 2.1 10*3/uL (ref 0.7–4.0)
Lymphocytes Relative: 33.4 % (ref 12.0–46.0)
MCHC: 33.6 g/dL (ref 30.0–36.0)
MCV: 72.5 fl — AB (ref 78.0–100.0)
MONOS PCT: 7.2 % (ref 3.0–12.0)
Monocytes Absolute: 0.5 10*3/uL (ref 0.1–1.0)
NEUTROS PCT: 56.9 % (ref 43.0–77.0)
Neutro Abs: 3.6 10*3/uL (ref 1.4–7.7)
Platelets: 301 10*3/uL (ref 150.0–400.0)
RBC: 4.64 Mil/uL (ref 3.87–5.11)
RDW: 15.1 % (ref 11.5–15.5)
WBC: 6.3 10*3/uL (ref 4.0–10.5)

## 2016-06-14 LAB — LIPID PANEL
CHOLESTEROL: 167 mg/dL (ref 0–200)
HDL: 62.1 mg/dL (ref >39.00)
LDL CALC: 90 mg/dL (ref 0–99)
NonHDL: 104.46
TRIGLYCERIDES: 71 mg/dL (ref 0.0–149.0)
Total CHOL/HDL Ratio: 3
VLDL: 14.2 mg/dL (ref 0.0–40.0)

## 2016-06-14 LAB — HEPATIC FUNCTION PANEL
ALBUMIN: 4.2 g/dL (ref 3.5–5.2)
ALK PHOS: 37 U/L — AB (ref 39–117)
ALT: 16 U/L (ref 0–35)
AST: 14 U/L (ref 0–37)
Bilirubin, Direct: 0.2 mg/dL (ref 0.0–0.3)
TOTAL PROTEIN: 7.3 g/dL (ref 6.0–8.3)
Total Bilirubin: 0.4 mg/dL (ref 0.2–1.2)

## 2016-06-14 LAB — BASIC METABOLIC PANEL
BUN: 12 mg/dL (ref 6–23)
CHLORIDE: 100 meq/L (ref 96–112)
CO2: 30 meq/L (ref 19–32)
Calcium: 9.2 mg/dL (ref 8.4–10.5)
Creatinine, Ser: 0.89 mg/dL (ref 0.40–1.20)
GFR: 73.7 mL/min (ref >60.00)
Glucose, Bld: 97 mg/dL (ref 70–99)
POTASSIUM: 3.7 meq/L (ref 3.5–5.1)
SODIUM: 135 meq/L (ref 135–145)

## 2016-06-14 LAB — IBC PANEL
IRON: 64 ug/dL (ref 42–145)
SATURATION RATIOS: 16.9 % — AB (ref 20.0–50.0)
TRANSFERRIN: 270 mg/dL (ref 212.0–360.0)

## 2016-06-14 LAB — VITAMIN B12: Vitamin B-12: 355 pg/mL (ref 211–911)

## 2016-06-14 LAB — FERRITIN: FERRITIN: 9 ng/mL — AB (ref 10.0–291.0)

## 2016-06-14 NOTE — Assessment & Plan Note (Signed)
Physical today 06/14/16.  Mammogram 09/15/15 - Birads I.  PAP 06/14/16.

## 2016-06-14 NOTE — Progress Notes (Signed)
Patient ID: [name] Nielsen, female   DOB: [birth year], 40 y.o.   MRN: [id number]   Subjective:    Patient ID: [name] Nielsen, female    DOB: 09-20-1976, 40 y.o.   MRN: [id number]  HPI  Patient here for her physical exam.  She states she is doing relatively well.  Increased stress with her work.  She loves her job, but does have a lot of stress.  Tries to stay active.  Is walking.  Does report some fatigue.  No chest pain.  No sob.  No acid reflux.  No abdominal pain or cramping.  Bowels stable.     Past Medical History:  Diagnosis Date  . Anemia    hx of   . Anxiety   . Depression   . Frequent headaches    hx of  . Heart murmur   . Hx: UTI (urinary tract infection)    child, s/p urinary procecure  . Hypercholesterolemia   . Hypertension   . Hypothyroidism    Past Surgical History:  Procedure Laterality Date  . CHOLECYSTECTOMY  2012  . LAPAROSCOPIC GASTRIC SLEEVE RESECTION N/A 07/05/2014   Procedure: LAPAROSCOPIC GASTRIC SLEEVE RESECTION with upper endoscopy;  Surgeon: Wenda Low, MD;  Location: [facility];  Service: General;  Laterality: N/A;  . TUBAL LIGATION  1999   Family History  Problem Relation Age of Onset  . Cancer Mother     brain  . Diabetes Father   . Hypertension Father   . Kidney disease Father   . Stroke Father   . Cancer Father     prostate   Social History   Social History  . Marital status: Single    Spouse name: N/A  . Number of children: N/A  . Years of education: N/A   Social History Main Topics  . Smoking status: Never Smoker  . Smokeless tobacco: Never Used  . Alcohol use No  . Drug use: No  . Sexual activity: Not Asked   Other Topics Concern  . None   Social History Narrative  . None    Outpatient Encounter Prescriptions as of 06/14/2016  Medication Sig  . acetaminophen (TYLENOL) 500 MG tablet Take 1,000 mg by mouth every 6 (six) hours as needed for mild pain or moderate pain.  Marland Kitchen ALPRAZolam (XANAX) 0.25 MG tablet Take 1 tablet (0.25  mg total) by mouth daily as needed for anxiety.  . Cyanocobalamin (B-12 PO) Take by mouth.  . Fe Fum-FePoly-Vit C-Vit B3 (INTEGRA) 62.5-62.5-40-3 MG CAPS Take 1 capsule by mouth daily.  . hydrochlorothiazide (HYDRODIURIL) 25 MG tablet TAKE ONE TABLET BY MOUTH ONCE DAILY  . levothyroxine (SYNTHROID, LEVOTHROID) 150 MCG tablet TAKE ONE TABLET BY MOUTH ONCE DAILY BEFORE BREAKFAST  . lisinopril (PRINIVIL,ZESTRIL) 20 MG tablet Take 1 tablet (20 mg total) by mouth daily.  . Multiple Vitamins-Iron (MULTIVITAMINS WITH IRON) TABS tablet Take 1 tablet by mouth daily.  . rosuvastatin (CRESTOR) 5 MG tablet Take 1 tablet (5 mg total) by mouth daily.  . traZODone (DESYREL) 150 MG tablet as needed.  . [DISCONTINUED] buPROPion (WELLBUTRIN SR) 100 MG 12 hr tablet Take 100 mg by mouth 2 (two) times daily. Reported on 03/08/2016  . [DISCONTINUED] capsaicin (ZOSTRIX) 0.025 % cream Apply topically 2 (two) times daily.  . [DISCONTINUED] valACYclovir (VALTREX) 1000 MG tablet Take 1 tablet (1,000 mg total) by mouth 3 (three) times daily.  . [DISCONTINUED] VYVANSE 20 MG capsule Take 20 mg by mouth daily. Reported on 03/08/2016   No facility-administered  encounter medications on file as of 06/14/2016.     Review of Systems  Constitutional: Positive for fatigue.       Not eating regular meals.  Has had some weight loss.    HENT: Negative for congestion and sinus pressure.   Eyes: Negative for pain and visual disturbance.  Respiratory: Negative for cough, chest tightness and shortness of breath.   Cardiovascular: Negative for chest pain, palpitations and leg swelling.  Gastrointestinal: Negative for abdominal pain, diarrhea, nausea and vomiting.  Genitourinary: Negative for difficulty urinating and dysuria.  Musculoskeletal: Negative for back pain and joint swelling.  Skin: Negative for color change and rash.  Neurological: Negative for dizziness, light-headedness and headaches.  Hematological: Negative for adenopathy.  Does not bruise/bleed easily.  Psychiatric/Behavioral: Negative for agitation and dysphoric mood.       Objective:     Blood pressure rechecked by me:  104/68  Physical Exam  Constitutional: She is oriented to person, place, and time. She appears well-developed and well-nourished. No distress.  HENT:  Nose: Nose normal.  Mouth/Throat: Oropharynx is clear and moist.  Eyes: Right eye exhibits no discharge. Left eye exhibits no discharge. No scleral icterus.  Neck: Neck supple. No thyromegaly present.  Cardiovascular: Normal rate and regular rhythm.   Pulmonary/Chest: Breath sounds normal. No accessory muscle usage. No tachypnea. No respiratory distress. She has no decreased breath sounds. She has no wheezes. She has no rhonchi. Right breast exhibits no inverted nipple, no mass, no nipple discharge and no tenderness (no axillary adenopathy). Left breast exhibits no inverted nipple, no mass, no nipple discharge and no tenderness (no axilarry adenopathy).  Abdominal: Soft. Bowel sounds are normal. There is no tenderness.  Genitourinary:  Genitourinary Comments: Normal external genitalia.  Vaginal vault without lesions.  Cervix identified.  Pap smear performed.  Could not appreciate any adnexal masses or tenderness.    Musculoskeletal: She exhibits no edema or tenderness.  Lymphadenopathy:    She has no cervical adenopathy.  Neurological: She is alert and oriented to person, place, and time.  Skin: Skin is warm. No rash noted. No erythema.  Psychiatric: She has a normal mood and affect. Her behavior is normal.    BP 104/64   Pulse 77   Temp 97.6 F (36.4 C) (Oral)   Ht 5\' 7"  (1.702 m)   Wt 206 lb 9.6 oz (93.7 kg)   LMP 06/03/2016 (Approximate)   SpO2 98%   BMI 32.36 kg/m  Wt Readings from Last 3 Encounters:  06/14/16 206 lb 9.6 oz (93.7 kg)  04/12/16 212 lb 12.8 oz (96.5 kg)  03/08/16 209 lb (94.8 kg)     Lab Results  Component Value Date   WBC 6.3 06/14/2016   HGB 11.3 (L)  06/14/2016   HCT 33.6 (L) 06/14/2016   PLT 301.0 06/14/2016   GLUCOSE 97 06/14/2016   CHOL 167 06/14/2016   TRIG 71.0 06/14/2016   HDL 62.10 06/14/2016   LDLDIRECT 178.3 11/11/2013   LDLCALC 90 06/14/2016   ALT 16 06/14/2016   AST 14 06/14/2016   NA 135 06/14/2016   K 3.7 06/14/2016   CL 100 06/14/2016   CREATININE 0.89 06/14/2016   BUN 12 06/14/2016   CO2 30 06/14/2016   TSH 1.85 08/17/2015       Assessment & Plan:   Problem List Items Addressed This Visit    Abnormal liver function test    Continue diet and exercise.  Follow liver panel.  Anemia    S/p gastric surgery.  Follow cbc.       Essential hypertension, benign    Blood pressure as outlined.  Discussed decreasing medication.  Tried this previously and blood pressure increased.  Follow.  She wants to hold on changing.        Health care maintenance    Physical today 06/14/16.  Mammogram 09/15/15 - Birads I.  PAP 06/14/16.       Hypercholesterolemia    On crestor.  Low cholesterol diet and exercise.  Follow lipid panel and liver function tests.        Hypothyroidism    On thyroid replacement.  Follow tsh.       Obesity (BMI 30-39.9)    She has adjusted her diet.  Needs to eat regular meals.  Exercise.  Follow.       Status post laparoscopic sleeve gastrectomy    Has lost weight.  Discussed importance of eating regular meals.  Follow.        Other Visit Diagnoses    Routine general medical examination at a health care facility    -  Primary   Screening breast examination       Relevant Orders   MM Digital Screening   Pap smear for cervical cancer screening           Dale Winn, MD

## 2016-06-14 NOTE — Progress Notes (Signed)
Pre visit review using our clinic review tool, if applicable. No additional management support is needed unless otherwise documented below in the visit note. 

## 2016-06-14 NOTE — Telephone Encounter (Signed)
Pt will need a 6 month appt for a follow up. She stated that you can message her the appt time and date through [facility]

## 2016-06-16 ENCOUNTER — Encounter: Payer: Self-pay | Admitting: Internal Medicine

## 2016-06-16 NOTE — Assessment & Plan Note (Signed)
Continue diet and exercise. Follow liver panel.  

## 2016-06-16 NOTE — Assessment & Plan Note (Signed)
Blood pressure as outlined.  Discussed decreasing medication.  Tried this previously and blood pressure increased.  Follow.  She wants to hold on changing.

## 2016-06-16 NOTE — Assessment & Plan Note (Signed)
S/p gastric surgery.  Follow cbc.  

## 2016-06-16 NOTE — Assessment & Plan Note (Signed)
She has adjusted her diet.  Needs to eat regular meals.  Exercise.  Follow.

## 2016-06-16 NOTE — Assessment & Plan Note (Signed)
On crestor.  Low cholesterol diet and exercise.  Follow lipid panel and liver function tests.   

## 2016-06-16 NOTE — Assessment & Plan Note (Signed)
On thyroid replacement.  Follow tsh.  

## 2016-06-16 NOTE — Assessment & Plan Note (Signed)
Has lost weight.  Discussed importance of eating regular meals.  Follow.

## 2016-06-17 MED ORDER — INTEGRA 62.5-62.5-40-3 MG PO CAPS: 1.0000 | ORAL_CAPSULE | Freq: Every day | ORAL | 2 refills | Status: DC

## 2016-06-17 NOTE — Telephone Encounter (Signed)
-----   Message from Acey Lavanya M Roman, RN sent at 06/17/2016  8:26 AM EDT -----   ----- Message ----- From: Dale Durhamharlene Scott, MD Sent: 06/17/2016   5:49 AM To: Warden FillersLatoya S Wright, CMA  Please call and notify pt that her cholesterol looks good.  hgb is stable.  Iron stores low.  Need to clarify how much iron she is taking.  Will need to adjust dose.  B12 is wnl, but is in low normal range.  Make sure she is taking B12 1000mcg q day.  Will need to follow hgb and iron stores.

## 2016-06-17 NOTE — Telephone Encounter (Signed)
Patient notified of results and iron called to pharmacy

## 2016-06-18 LAB — CYTOLOGY - PAP

## 2016-06-19 ENCOUNTER — Encounter: Payer: Self-pay | Admitting: Internal Medicine

## 2016-07-05 ENCOUNTER — Encounter (HOSPITAL_COMMUNITY): Payer: Self-pay

## 2016-07-19 ENCOUNTER — Encounter: Payer: Self-pay | Admitting: Internal Medicine

## 2016-07-19 DIAGNOSIS — E039 Hypothyroidism, unspecified: Secondary | ICD-10-CM

## 2016-07-19 NOTE — Telephone Encounter (Signed)
Order placed for tsh.  Pt notified via my chart.  See note.

## 2016-07-20 ENCOUNTER — Other Ambulatory Visit: Payer: Self-pay | Admitting: Internal Medicine

## 2016-07-24 ENCOUNTER — Encounter: Payer: Self-pay | Admitting: Internal Medicine

## 2016-07-24 ENCOUNTER — Other Ambulatory Visit (INDEPENDENT_AMBULATORY_CARE_PROVIDER_SITE_OTHER): Payer: Managed Care, Other (non HMO)

## 2016-07-24 DIAGNOSIS — E039 Hypothyroidism, unspecified: Secondary | ICD-10-CM

## 2016-07-24 LAB — TSH: TSH: 1.74 u[IU]/mL (ref 0.35–4.50)

## 2016-07-29 ENCOUNTER — Encounter (HOSPITAL_COMMUNITY): Payer: Self-pay

## 2016-07-29 ENCOUNTER — Emergency Department (HOSPITAL_COMMUNITY)
Admission: EM | Admit: 2016-07-29 | Discharge: 2016-07-29 | Disposition: A | Discharge status: Home / Self Care | Payer: Managed Care, Other (non HMO) | Attending: Emergency Medicine | Admitting: Emergency Medicine

## 2016-07-29 DIAGNOSIS — Z79899 Other long term (current) drug therapy: Secondary | ICD-10-CM | POA: Insufficient documentation

## 2016-07-29 DIAGNOSIS — I10 Essential (primary) hypertension: Secondary | ICD-10-CM | POA: Insufficient documentation

## 2016-07-29 DIAGNOSIS — E039 Hypothyroidism, unspecified: Secondary | ICD-10-CM | POA: Insufficient documentation

## 2016-07-29 DIAGNOSIS — K029 Dental caries, unspecified: Secondary | ICD-10-CM | POA: Diagnosis not present

## 2016-07-29 DIAGNOSIS — K0889 Other specified disorders of teeth and supporting structures: Secondary | ICD-10-CM | POA: Diagnosis present

## 2016-07-29 MED ORDER — NAPROXEN 500 MG PO TABS: 500.0000 mg | ORAL_TABLET | Freq: Two times a day (BID) | ORAL | 0 refills | Status: DC

## 2016-07-29 MED ORDER — PENICILLIN V POTASSIUM 500 MG PO TABS
500.0000 mg | ORAL_TABLET | Freq: Once | ORAL | Status: AC
  Administered 2016-07-29: 500 mg via ORAL
  Filled 2016-07-29: qty 1

## 2016-07-29 MED ORDER — NAPROXEN 500 MG PO TABS
500.0000 mg | ORAL_TABLET | Freq: Once | ORAL | Status: AC
  Administered 2016-07-29: 500 mg via ORAL
  Filled 2016-07-29: qty 1

## 2016-07-29 MED ORDER — PENICILLIN V POTASSIUM 500 MG PO TABS: 500.0000 mg | ORAL_TABLET | Freq: Four times a day (QID) | ORAL | 0 refills | Status: AC

## 2016-07-29 NOTE — ED Triage Notes (Signed)
Patient c/o upper left back tooth pain.  Patient states that had began x2 days ago.  Patient was trying to wait and see a dentist but, pain became worse this morning.  Patient states has been using orajel and tea bags which helped for a while but, are not providing relief today

## 2016-07-29 NOTE — ED Provider Notes (Signed)
[facility]-EMERGENCY DEPT Provider Note   CSN: [id number] Arrival date & time: 07/29/16  0423     History   Chief Complaint Chief Complaint  Patient presents with  . Dental Pain    HPI [name] Nielsen is a 40 y.o. female with past medical history of HTN, HLD who presents to the ED today complaining of dental pain. Patient states that one week ago her left upper molar began intermittently irritating her. Patient states the pain has progressively worsened and now it is unbearable. She has tried taking Excedrin, ibuprofen and Tylenol without relief of her symptoms. Patient has a scheduled with a dentist next week but did not feel like she could wait until then. She denies any facial swelling, fevers, difficulty swallowing, swelling under tongue. Patient is uncertain if she has broken his tooth in the past. No history of dental work done on that tooth.  HPI  Past Medical History:  Diagnosis Date  . Anemia    hx of   . Anxiety   . Depression   . Frequent headaches    hx of  . Heart murmur   . Hx: UTI (urinary tract infection)    child, s/p urinary procecure  . Hypercholesterolemia   . Hypertension   . Hypothyroidism     Patient Active Problem List   Diagnosis Date Noted  . Arm swelling 12/25/2015  . Major depressive disorder, recurrent episode (HCC) 07/10/2015  . Insomnia 07/10/2015  . Poor concentration 01/22/2015  . Health care maintenance 11/20/2014  . UTI (urinary tract infection) 08/27/2014  . 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 Abnormal liver function test 02/06/2014  . Obesity (BMI 30-39.9) 10/06/2013  . Anemia 07/05/2013  . Headache(784.0) 05/05/2013  . Essential hypertension, benign 05/05/2013  . Hypercholesterolemia 05/05/2013  . Hypothyroidism 05/05/2013    Past Surgical History:  Procedure Laterality Date  . CHOLECYSTECTOMY  2012  . LAPAROSCOPIC GASTRIC SLEEVE RESECTION N/A 07/05/2014   Procedure: LAPAROSCOPIC GASTRIC SLEEVE RESECTION  with upper endoscopy;  Surgeon: Wenda LowMatt Martin, MD;  Location: [facility];  Service: General;  Laterality: N/A;  . TUBAL LIGATION  1999    OB History    No data available       Home Medications    Prior to Admission medications   Medication Sig Start Date End Date Taking? Authorizing Provider  acetaminophen (TYLENOL) 500 MG tablet Take 1,000 mg by mouth every 6 (six) hours as needed for mild pain or moderate pain.    Historical Provider, MD  ALPRAZolam Prudy Feeler(XANAX) 0.25 MG tablet Take 1 tablet (0.25 mg total) by mouth daily as needed for anxiety. 12/26/15   Dale Durhamharlene Scott, MD  Cyanocobalamin (B-12 PO) Take by mouth.    Historical Provider, MD  Fe Fum-FePoly-Vit C-Vit B3 (INTEGRA) 62.5-62.5-40-3 MG CAPS Take 1 capsule by mouth daily. 06/17/16   Dale Durhamharlene Scott, MD  hydrochlorothiazide (HYDRODIURIL) 25 MG tablet TAKE ONE TABLET BY MOUTH ONCE DAILY 04/22/16   Dale Durhamharlene Scott, MD  levothyroxine (SYNTHROID, LEVOTHROID) 150 MCG tablet TAKE ONE TABLET BY MOUTH ONCE DAILY BEFORE BREAKFAST 02/05/16   Dale Durhamharlene Scott, MD  lisinopril (PRINIVIL,ZESTRIL) 20 MG tablet Take 1 tablet (20 mg total) by mouth daily. 03/20/16   Dale Durhamharlene Scott, MD  lisinopril (PRINIVIL,ZESTRIL) 20 MG tablet TAKE ONE TABLET BY MOUTH ONCE DAILY 07/22/16   Dale Durhamharlene Scott, MD  Multiple Vitamins-Iron (MULTIVITAMINS WITH IRON) TABS tablet Take 1 tablet by mouth daily.    Historical Provider, MD  rosuvastatin (CRESTOR) 5 MG tablet Take 1 tablet (5  mg total) by mouth daily. 08/22/15   Dale [place], MD  traZODone (DESYREL) 150 MG tablet as needed. 06/15/15   Historical Provider, MD    Family History Family History  Problem Relation Age of Onset  . Cancer Mother     brain  . Diabetes Father   . Hypertension Father   . Kidney disease Father   . Stroke Father   . Cancer Father     prostate    Social History Social History  Substance Use Topics  . Smoking status: Never Smoker  . Smokeless tobacco: Never Used  . Alcohol use No      Allergies   Review of patient's allergies indicates no known allergies.   Review of Systems Review of Systems  All other systems reviewed and are negative.    Physical Exam Updated Vital Signs BP 99/60 (BP Location: Left Arm)   Pulse 75   Temp 98.2 F (36.8 C)   Resp 18   Ht 5\' 7"  (1.702 m)   Wt 93 kg   LMP 07/08/2016 (Approximate)   SpO2 100%   BMI 32.11 kg/m   Physical Exam  Constitutional: She is oriented to person, place, and time. She appears well-developed and well-nourished. No distress.  HENT:  Head: Normocephalic and atraumatic.  Mouth/Throat: Uvula is midline, oropharynx is clear and moist and mucous membranes are normal. No trismus in the jaw. Abnormal dentition. Dental caries present. No dental abscesses.    No swelling under tongue  Eyes: Conjunctivae are normal. Right eye exhibits no discharge. Left eye exhibits no discharge. No scleral icterus.  Cardiovascular: Normal rate.   Pulmonary/Chest: Effort normal.  Neurological: She is alert and oriented to person, place, and time. Coordination normal.  Skin: Skin is warm and dry. No rash noted. She is not diaphoretic. No erythema. No pallor.  Psychiatric: She has a normal mood and affect. Her behavior is normal.  Nursing note and vitals reviewed.    ED Treatments / Results  Labs (all labs ordered are listed, but only abnormal results are displayed) Labs Reviewed - No data to display  EKG  EKG Interpretation None       Radiology No results found.  Procedures Procedures (including critical care time)  Medications Ordered in ED Medications  penicillin v potassium (VEETID) tablet 500 mg (not administered)  naproxen (NAPROSYN) tablet 500 mg (not administered)     Initial Impression / Assessment and Plan / ED Course  I have reviewed the triage vital signs and the nursing notes.  Pertinent labs & imaging results that were available during my care of the patient were reviewed by me and  considered in my medical decision making (see chart for details).  Clinical Course   Patient with toothache and severe dental caries.  No gross abscess.  Exam unconcerning for Ludwig's angina or spread of infection.  Will treat with penicillin and pain medicine.  Urged patient to follow-up with dentist.    Final Clinical Impressions(s) / ED Diagnoses   Final diagnoses:  Dental caries    New Prescriptions New Prescriptions   No medications on file     Dub Mikes, PA-C 07/29/16 0944    Maia Plan, MD 07/29/16 1527

## 2016-07-29 NOTE — Discharge Instructions (Signed)
Follow up with a dentist as soon as possible. Take antibiotics as prescribed. Return to the ED if you experience facial swelling, fever, difficulty swallowing or breathing.

## 2016-07-29 NOTE — ED Notes (Signed)
Pt has left upper tooth pain radiating to left side of temple to left shoulder pt states that she has the pain for the past 2 days/ Pt states she has taken tylenol, Excedrin, Ibuprofen was not effective in managing pain.

## 2016-08-06 ENCOUNTER — Encounter: Payer: Self-pay | Admitting: Internal Medicine

## 2016-09-03 ENCOUNTER — Other Ambulatory Visit: Payer: Self-pay | Admitting: Internal Medicine

## 2016-09-16 ENCOUNTER — Other Ambulatory Visit: Payer: Self-pay | Admitting: Internal Medicine

## 2016-09-16 ENCOUNTER — Ambulatory Visit
Admission: RE | Admit: 2016-09-16 | Discharge: 2016-09-16 | Disposition: A | Discharge status: Home / Self Care | Payer: Managed Care, Other (non HMO) | Source: Ambulatory Visit | Attending: Internal Medicine | Admitting: Internal Medicine

## 2016-09-16 DIAGNOSIS — Z1231 Encounter for screening mammogram for malignant neoplasm of breast: Secondary | ICD-10-CM | POA: Diagnosis not present

## 2016-09-16 DIAGNOSIS — Z1239 Encounter for other screening for malignant neoplasm of breast: Secondary | ICD-10-CM

## 2016-09-23 ENCOUNTER — Encounter: Payer: Self-pay | Admitting: Internal Medicine

## 2016-09-23 ENCOUNTER — Telehealth: Payer: Self-pay | Admitting: Surgical

## 2016-09-23 NOTE — Telephone Encounter (Signed)
Left message for patient to call the office. She sent a My chart message saying she is having right arm pain. Dr. Lorin PicketScott has an opening 09/24/16 at 8 AM and was going to see if we could get her scheduled then.

## 2016-09-23 NOTE — Telephone Encounter (Signed)
Pt scheduled  

## 2016-09-24 ENCOUNTER — Encounter: Payer: Self-pay | Admitting: Internal Medicine

## 2016-09-24 ENCOUNTER — Ambulatory Visit (INDEPENDENT_AMBULATORY_CARE_PROVIDER_SITE_OTHER): Payer: Managed Care, Other (non HMO) | Admitting: Internal Medicine

## 2016-09-24 DIAGNOSIS — M25521 Pain in right elbow: Secondary | ICD-10-CM | POA: Diagnosis not present

## 2016-09-24 NOTE — Progress Notes (Signed)
Pre visit review using our clinic review tool, if applicable. No additional management support is needed unless otherwise documented below in the visit note. 

## 2016-09-24 NOTE — Progress Notes (Signed)
Patient ID: [name] Nielsen, female   DOB: 13-Jan-1976, 40 y.o.   MRN: [id number]   Subjective:    Patient ID: [name] DoloresElizabeth Nielsen, female    DOB: 13-Jan-1976, 40 y.o.   MRN: [id number]  HPI  Patient here as a work in with concerns regarding right arm pain.  States that she was moving furniture in 07/2016.  Was cleaning carpets then as well.  Noticed after -  some right arm soreness.  This has gradually worsened.  Increased pain in her elbow and then extending down her forearm.  Hurts to lift or move her arm.  Unable to grip or twist.  Has taken some occasional tylenol.  Given persistent pain, decided to come in for evaluation.     Past Medical History:  Diagnosis Date  . Anemia    hx of   . Anxiety   . Depression   . Frequent headaches    hx of  . Heart murmur   . Hx: UTI (urinary tract infection)    child, s/p urinary procecure  . Hypercholesterolemia   . Hypertension   . Hypothyroidism    Past Surgical History:  Procedure Laterality Date  . CHOLECYSTECTOMY  2012  . LAPAROSCOPIC GASTRIC SLEEVE RESECTION N/A 07/05/2014   Procedure: LAPAROSCOPIC GASTRIC SLEEVE RESECTION with upper endoscopy;  Surgeon: Wenda LowMatt Martin, MD;  Location: [facility];  Service: General;  Laterality: N/A;  . TUBAL LIGATION  1999   Family History  Problem Relation Age of Onset  . Cancer Mother     brain  . Diabetes Father   . Hypertension Father   . Kidney disease Father   . Stroke Father   . Cancer Father     prostate   Social History   Social History  . Marital status: Single    Spouse name: N/A  . Number of children: N/A  . Years of education: N/A   Social History Main Topics  . Smoking status: Never Smoker  . Smokeless tobacco: Never Used  . Alcohol use No  . Drug use: No  . Sexual activity: Not Asked   Other Topics Concern  . None   Social History Narrative  . None    Outpatient Encounter Prescriptions as of 09/24/2016  Medication Sig  . acetaminophen (TYLENOL) 500 MG tablet Take 1,000  mg by mouth every 6 (six) hours as needed for mild pain or moderate pain.  Marland Kitchen. ALPRAZolam (XANAX) 0.25 MG tablet Take 1 tablet (0.25 mg total) by mouth daily as needed for anxiety.  . Cyanocobalamin (B-12 PO) Take by mouth.  . Fe Fum-FePoly-Vit C-Vit B3 (INTEGRA) 62.5-62.5-40-3 MG CAPS Take 1 capsule by mouth daily.  . hydrochlorothiazide (HYDRODIURIL) 25 MG tablet TAKE ONE TABLET BY MOUTH ONCE DAILY  . levothyroxine (SYNTHROID, LEVOTHROID) 150 MCG tablet TAKE ONE TABLET BY MOUTH ONCE DAILY BEFORE BREAKFAST  . lisinopril (PRINIVIL,ZESTRIL) 20 MG tablet Take 1 tablet (20 mg total) by mouth daily.  . Multiple Vitamins-Iron (MULTIVITAMINS WITH IRON) TABS tablet Take 1 tablet by mouth daily.  . naproxen (NAPROSYN) 500 MG tablet Take 1 tablet (500 mg total) by mouth 2 (two) times daily.  . rosuvastatin (CRESTOR) 5 MG tablet TAKE ONE TABLET BY MOUTH ONCE DAILY  . traZODone (DESYREL) 150 MG tablet as needed.  . [DISCONTINUED] lisinopril (PRINIVIL,ZESTRIL) 20 MG tablet TAKE ONE TABLET BY MOUTH ONCE DAILY   No facility-administered encounter medications on file as of 09/24/2016.     Review of Systems  Constitutional: Negative for appetite change and unexpected  weight change.  Respiratory: Negative for cough and shortness of breath.   Cardiovascular: Negative for chest pain and palpitations.  Gastrointestinal: Negative for nausea and vomiting.  Musculoskeletal:       Persistent increased arm pain as outlined.  No increased swelling.   Skin: Negative for color change and rash.       Objective:    Physical Exam  Constitutional: She appears well-developed and well-nourished. No distress.  HENT:  Nose: Nose normal.  Mouth/Throat: Oropharynx is clear and moist.  Neck: Neck supple.  Cardiovascular: Normal rate and regular rhythm.   Pulmonary/Chest: Breath sounds normal. No respiratory distress. She has no wheezes.  Abdominal: Soft. Bowel sounds are normal. There is no tenderness.    Musculoskeletal: She exhibits no edema.  Increased pain to palpation - right mid arm.  Some pain with rotation of her forearm.  No increased neck pain with palpation or with rotation of her head.   Lymphadenopathy:    She has no cervical adenopathy.  Skin: No rash noted. No erythema.    BP 116/64   Pulse 81   Temp 98.1 F (36.7 C) (Oral)   Wt 203 lb 3.2 oz (92.2 kg)   LMP 08/26/2016 (Approximate)   SpO2 98%   BMI 31.83 kg/m  Wt Readings from Last 3 Encounters:  09/24/16 203 lb 3.2 oz (92.2 kg)  07/29/16 205 lb (93 kg)  06/14/16 206 lb 9.6 oz (93.7 kg)     Lab Results  Component Value Date   WBC 6.3 06/14/2016   HGB 11.3 (L) 06/14/2016   HCT 33.6 (L) 06/14/2016   PLT 301.0 06/14/2016   GLUCOSE 97 06/14/2016   CHOL 167 06/14/2016   TRIG 71.0 06/14/2016   HDL 62.10 06/14/2016   LDLDIRECT 178.3 11/11/2013   LDLCALC 90 06/14/2016   ALT 16 06/14/2016   AST 14 06/14/2016   NA 135 06/14/2016   K 3.7 06/14/2016   CL 100 06/14/2016   CREATININE 0.89 06/14/2016   BUN 12 06/14/2016   CO2 30 06/14/2016   TSH 1.74 07/24/2016        Assessment & Plan:   Problem List Items Addressed This Visit    Right elbow pain    Arm/elbow pain as outlined.  Exam as outlined.  Elbow strap.  Gentle use of antiinflammatories.  Refer to ortho for further evaluation and treatment.        Relevant Orders   Ambulatory referral to Orthopedic Surgery       Dale DurhamSCOTT, Amous Crewe, MD

## 2016-10-07 ENCOUNTER — Encounter: Payer: Self-pay | Admitting: Internal Medicine

## 2016-10-07 NOTE — Assessment & Plan Note (Signed)
Arm/elbow pain as outlined.  Exam as outlined.  Elbow strap.  Gentle use of antiinflammatories.  Refer to ortho for further evaluation and treatment.

## 2016-11-25 ENCOUNTER — Other Ambulatory Visit: Payer: Self-pay | Admitting: Internal Medicine

## 2016-12-11 ENCOUNTER — Other Ambulatory Visit: Payer: Self-pay

## 2016-12-11 MED ORDER — LEVOTHYROXINE SODIUM 150 MCG PO TABS: ORAL_TABLET | ORAL | 11 refills | Status: DC

## 2016-12-13 ENCOUNTER — Encounter: Payer: Self-pay | Admitting: Internal Medicine

## 2016-12-13 ENCOUNTER — Ambulatory Visit (INDEPENDENT_AMBULATORY_CARE_PROVIDER_SITE_OTHER): Payer: Managed Care, Other (non HMO) | Admitting: Internal Medicine

## 2016-12-13 VITALS — BP 110/62 | HR 73 | Temp 98.8°F | Resp 16 | Ht 67.0 in | Wt 209.8 lb

## 2016-12-13 DIAGNOSIS — I10 Essential (primary) hypertension: Secondary | ICD-10-CM | POA: Diagnosis not present

## 2016-12-13 DIAGNOSIS — E669 Obesity, unspecified: Secondary | ICD-10-CM | POA: Diagnosis not present

## 2016-12-13 DIAGNOSIS — E7800 Pure hypercholesterolemia, unspecified: Secondary | ICD-10-CM | POA: Diagnosis not present

## 2016-12-13 DIAGNOSIS — Z9884 Bariatric surgery status: Secondary | ICD-10-CM

## 2016-12-13 DIAGNOSIS — D649 Anemia, unspecified: Secondary | ICD-10-CM | POA: Diagnosis not present

## 2016-12-13 DIAGNOSIS — M25521 Pain in right elbow: Secondary | ICD-10-CM

## 2016-12-13 DIAGNOSIS — E039 Hypothyroidism, unspecified: Secondary | ICD-10-CM

## 2016-12-13 MED ORDER — ALPRAZOLAM 0.25 MG PO TABS: 0.2500 mg | ORAL_TABLET | Freq: Every day | ORAL | 0 refills | Status: DC | PRN

## 2016-12-13 MED ORDER — INTEGRA 62.5-62.5-40-3 MG PO CAPS: 1.0000 | ORAL_CAPSULE | Freq: Every day | ORAL | 1 refills | Status: DC

## 2016-12-13 MED ORDER — NAPROXEN 500 MG PO TABS: 500.0000 mg | ORAL_TABLET | Freq: Two times a day (BID) | ORAL | 0 refills | Status: DC | PRN

## 2016-12-13 MED ORDER — LEVOTHYROXINE SODIUM 150 MCG PO TABS: ORAL_TABLET | ORAL | 1 refills | Status: DC

## 2016-12-13 MED ORDER — HYDROCHLOROTHIAZIDE 12.5 MG PO CAPS: 12.5000 mg | ORAL_CAPSULE | Freq: Every day | ORAL | 1 refills | Status: DC

## 2016-12-13 NOTE — Progress Notes (Signed)
Patient ID: [name], female   DOB: 1976-01-30, 41 y.o.   MRN: [id number]   Subjective:    Patient ID: [name], female    DOB: 04/26/1976, 41 y.o.   MRN: [id number]  HPI  Patient here for a scheduled follow up.  Still having pain in her right elbow/arm.  Has worn splint.  Taking naproxen.  No significant relief.  Now having some pain in the left arm as well.  No numbness or tingling.  No chest pain.  No sob.  No acid reflux.  No abdominal pain.  Bowels moving.  Works going well.     Past Medical History:  Diagnosis Date  . Anemia    hx of   . Anxiety   . Depression   . Frequent headaches    hx of  . Heart murmur   . Hx: UTI (urinary tract infection)    child, s/p urinary procecure  . Hypercholesterolemia   . Hypertension   . Hypothyroidism    Past Surgical History:  Procedure Laterality Date  . CHOLECYSTECTOMY  2012  . LAPAROSCOPIC GASTRIC SLEEVE RESECTION N/A 07/05/2014   Procedure: LAPAROSCOPIC GASTRIC SLEEVE RESECTION with upper endoscopy;  Surgeon: Wenda Low, MD;  Location: [facility];  Service: General;  Laterality: N/A;  . TUBAL LIGATION  1999   Family History  Problem Relation Age of Onset  . Cancer Mother     brain  . Diabetes Father   . Hypertension Father   . Kidney disease Father   . Stroke Father   . Cancer Father     prostate   Social History   Social History  . Marital status: Single    Spouse name: N/A  . Number of children: N/A  . Years of education: N/A   Social History Main Topics  . Smoking status: Never Smoker  . Smokeless tobacco: Never Used  . Alcohol use No  . Drug use: No  . Sexual activity: Not Asked   Other Topics Concern  . None   Social History Narrative  . None    Outpatient Encounter Prescriptions as of 12/13/2016  Medication Sig  . acetaminophen (TYLENOL) 500 MG tablet Take 1,000 mg by mouth every 6 (six) hours as needed for mild pain or moderate pain.  Marland Kitchen ALPRAZolam (XANAX) 0.25 MG tablet Take 1 tablet (0.25 mg  total) by mouth daily as needed for anxiety.  . Cyanocobalamin (B-12 PO) Take by mouth.  . Fe Fum-FePoly-Vit C-Vit B3 (INTEGRA) 62.5-62.5-40-3 MG CAPS Take 1 capsule by mouth daily.  Marland Kitchen levothyroxine (SYNTHROID, LEVOTHROID) 150 MCG tablet TAKE ONE TABLET BY MOUTH ONCE DAILY BEFORE BREAKFAST  . lisinopril (PRINIVIL,ZESTRIL) 20 MG tablet TAKE ONE TABLET BY MOUTH ONCE DAILY  . Multiple Vitamins-Iron (MULTIVITAMINS WITH IRON) TABS tablet Take 1 tablet by mouth daily.  . naproxen (NAPROSYN) 500 MG tablet Take 1 tablet (500 mg total) by mouth 2 (two) times daily as needed.  . rosuvastatin (CRESTOR) 5 MG tablet TAKE ONE TABLET BY MOUTH ONCE DAILY  . traZODone (DESYREL) 150 MG tablet as needed.  . [DISCONTINUED] ALPRAZolam (XANAX) 0.25 MG tablet Take 1 tablet (0.25 mg total) by mouth daily as needed for anxiety.  . [DISCONTINUED] Fe Fum-FePoly-Vit C-Vit B3 (INTEGRA) 62.5-62.5-40-3 MG CAPS Take 1 capsule by mouth daily.  . [DISCONTINUED] hydrochlorothiazide (HYDRODIURIL) 25 MG tablet TAKE ONE TABLET BY MOUTH ONCE DAILY  . [DISCONTINUED] levothyroxine (SYNTHROID, LEVOTHROID) 150 MCG tablet TAKE ONE TABLET BY MOUTH ONCE DAILY BEFORE BREAKFAST  . [DISCONTINUED] naproxen (  NAPROSYN) 500 MG tablet Take 1 tablet (500 mg total) by mouth 2 (two) times daily.  . hydrochlorothiazide (MICROZIDE) 12.5 MG capsule Take 1 capsule (12.5 mg total) by mouth daily.   No facility-administered encounter medications on file as of 12/13/2016.     Review of Systems  Constitutional: Negative for appetite change and unexpected weight change.  HENT: Positive for postnasal drip. Negative for congestion and sinus pressure.   Respiratory: Negative for cough, chest tightness and shortness of breath.   Cardiovascular: Negative for chest pain, palpitations and leg swelling.  Gastrointestinal: Negative for abdominal pain, diarrhea, nausea and vomiting.  Genitourinary: Negative for difficulty urinating and dysuria.  Musculoskeletal:  Negative for back pain.       Right arm pain > left arm pain.    Skin: Negative for color change and rash.  Neurological: Negative for dizziness, light-headedness and headaches.  Psychiatric/Behavioral: Negative for agitation and dysphoric mood.       Objective:    Physical Exam  Constitutional: She appears well-developed and well-nourished. No distress.  HENT:  Nose: Nose normal.  Mouth/Throat: Oropharynx is clear and moist.  Neck: Neck supple. No thyromegaly present.  Cardiovascular: Normal rate and regular rhythm.   Pulmonary/Chest: Breath sounds normal. No respiratory distress. She has no wheezes.  Abdominal: Soft. Bowel sounds are normal. There is no tenderness.  Musculoskeletal: She exhibits no edema or tenderness.  Increased pain with rotation of her right arm.    Lymphadenopathy:    She has no cervical adenopathy.  Skin: No rash noted. No erythema.  Psychiatric: She has a normal mood and affect. Her behavior is normal.    BP 110/62 (BP Location: Left Arm, Patient Position: Sitting, Cuff Size: Normal)   Pulse 73   Temp 98.8 F (37.1 C) (Oral)   Resp 16   Ht 5\' 7"  (1.702 m)   Wt 209 lb 12.8 oz (95.2 kg)   LMP 11/18/2016   SpO2 99%   BMI 32.86 kg/m  Wt Readings from Last 3 Encounters:  12/13/16 209 lb 12.8 oz (95.2 kg)  09/24/16 203 lb 3.2 oz (92.2 kg)  07/29/16 205 lb (93 kg)     Lab Results  Component Value Date   WBC 6.3 06/14/2016   HGB 11.3 (L) 06/14/2016   HCT 33.6 (L) 06/14/2016   PLT 301.0 06/14/2016   GLUCOSE 97 06/14/2016   CHOL 167 06/14/2016   TRIG 71.0 06/14/2016   HDL 62.10 06/14/2016   LDLDIRECT 178.3 11/11/2013   LDLCALC 90 06/14/2016   ALT 16 06/14/2016   AST 14 06/14/2016   NA 135 06/14/2016   K 3.7 06/14/2016   CL 100 06/14/2016   CREATININE 0.89 06/14/2016   BUN 12 06/14/2016   CO2 30 06/14/2016   TSH 1.74 07/24/2016    Mm Screening Breast Tomo Bilateral  Result Date: 09/16/2016 CLINICAL DATA:  Screening. EXAM: 2D DIGITAL  SCREENING BILATERAL MAMMOGRAM WITH CAD AND ADJUNCT TOMO COMPARISON:  Previous exam(s). ACR Breast Density Category b: There are scattered areas of fibroglandular density. FINDINGS: There are no findings suspicious for malignancy. Images were processed with CAD. IMPRESSION: No mammographic evidence of malignancy. A result letter of this screening mammogram will be mailed directly to the patient. RECOMMENDATION: Screening mammogram in one year. (Code:SM-B-01Y) BI-RADS CATEGORY  1: Negative. Electronically Signed   By: Harmon Pier M.D.   On: 09/16/2016 12:47       Assessment & Plan:   Problem List Items Addressed This Visit    Anemia -  Primary   Relevant Medications   Fe Fum-FePoly-Vit C-Vit B3 (INTEGRA) 62.5-62.5-40-3 MG CAPS   Other Relevant Orders   CBC with Differential/Platelet   Ferritin   Essential hypertension, benign    Blood pressure - low 100s.  Will decrease hctz to 12.5mg  q day.  Continue lisinopril at current dose.  Follow pressures.  Follow metabolic panel.        Relevant Medications   hydrochlorothiazide (MICROZIDE) 12.5 MG capsule   Other Relevant Orders   Basic metabolic panel   Hypercholesterolemia    On crestor.  Low cholesterol diet and exercise.  Follow lipid panel and liver function tests.        Relevant Medications   hydrochlorothiazide (MICROZIDE) 12.5 MG capsule   Other Relevant Orders   Hepatic function panel   Lipid panel   Hypothyroidism    On thyroid replacement.  Follow tsh.       Relevant Medications   levothyroxine (SYNTHROID, LEVOTHROID) 150 MCG tablet   Obesity (BMI 30-39.9)    Diet and exercise.        Right elbow pain    Has tried medication and splints.  No relief.  Refer to ortho.       Status post laparoscopic sleeve gastrectomy    Continue diet and exercise.            Dale DurhamSCOTT, Tremel Setters, MD

## 2016-12-13 NOTE — Progress Notes (Signed)
Pre-visit discussion using our clinic review tool. No additional management support is needed unless otherwise documented below in the visit note.  

## 2016-12-14 ENCOUNTER — Encounter: Payer: Self-pay | Admitting: Internal Medicine

## 2016-12-14 NOTE — Assessment & Plan Note (Signed)
On crestor.  Low cholesterol diet and exercise.  Follow lipid panel and liver function tests.   

## 2016-12-14 NOTE — Assessment & Plan Note (Signed)
Diet and exercise.   

## 2016-12-14 NOTE — Assessment & Plan Note (Signed)
Has tried medication and splints.  No relief.  Refer to ortho.

## 2016-12-14 NOTE — Assessment & Plan Note (Signed)
Blood pressure - low 100s.  Will decrease hctz to 12.5mg  q day.  Continue lisinopril at current dose.  Follow pressures.  Follow metabolic panel.

## 2016-12-14 NOTE — Assessment & Plan Note (Signed)
On thyroid replacement.  Follow tsh.  

## 2016-12-14 NOTE — Assessment & Plan Note (Signed)
Continue diet and exercise. 

## 2017-01-27 ENCOUNTER — Other Ambulatory Visit: Payer: Self-pay | Admitting: Internal Medicine

## 2017-01-28 ENCOUNTER — Encounter (HOSPITAL_COMMUNITY): Payer: Self-pay

## 2017-02-07 ENCOUNTER — Other Ambulatory Visit (INDEPENDENT_AMBULATORY_CARE_PROVIDER_SITE_OTHER): Payer: Managed Care, Other (non HMO)

## 2017-02-07 DIAGNOSIS — I10 Essential (primary) hypertension: Secondary | ICD-10-CM

## 2017-02-07 DIAGNOSIS — D649 Anemia, unspecified: Secondary | ICD-10-CM

## 2017-02-07 DIAGNOSIS — E7800 Pure hypercholesterolemia, unspecified: Secondary | ICD-10-CM

## 2017-02-07 NOTE — Addendum Note (Signed)
Addended by: Penne LashWIGGINS, Janine Reller N on: 02/07/2017 09:20 AM   Modules accepted: Orders

## 2017-02-11 ENCOUNTER — Encounter: Payer: Self-pay | Admitting: Internal Medicine

## 2017-02-11 ENCOUNTER — Ambulatory Visit (INDEPENDENT_AMBULATORY_CARE_PROVIDER_SITE_OTHER): Payer: Managed Care, Other (non HMO) | Admitting: Internal Medicine

## 2017-02-11 VITALS — BP 98/58 | HR 89 | Temp 98.0°F | Wt 209.0 lb

## 2017-02-11 DIAGNOSIS — M79641 Pain in right hand: Secondary | ICD-10-CM | POA: Diagnosis not present

## 2017-02-11 DIAGNOSIS — E039 Hypothyroidism, unspecified: Secondary | ICD-10-CM

## 2017-02-11 DIAGNOSIS — M79642 Pain in left hand: Secondary | ICD-10-CM | POA: Diagnosis not present

## 2017-02-11 DIAGNOSIS — I10 Essential (primary) hypertension: Secondary | ICD-10-CM

## 2017-02-11 DIAGNOSIS — R202 Paresthesia of skin: Secondary | ICD-10-CM

## 2017-02-11 DIAGNOSIS — E7800 Pure hypercholesterolemia, unspecified: Secondary | ICD-10-CM | POA: Diagnosis not present

## 2017-02-11 DIAGNOSIS — E669 Obesity, unspecified: Secondary | ICD-10-CM

## 2017-02-11 DIAGNOSIS — M25521 Pain in right elbow: Secondary | ICD-10-CM | POA: Diagnosis not present

## 2017-02-11 DIAGNOSIS — R200 Anesthesia of skin: Secondary | ICD-10-CM

## 2017-02-11 MED ORDER — LISINOPRIL 10 MG PO TABS: 10.0000 mg | ORAL_TABLET | Freq: Every day | ORAL | 2 refills | Status: DC

## 2017-02-11 NOTE — Progress Notes (Signed)
Pre visit review using our clinic review tool, if applicable. No additional management support is needed unless otherwise documented below in the visit note. 

## 2017-02-11 NOTE — Progress Notes (Signed)
Patient ID: [name] Nielsen, female   DOB: 07-Sep-1976, 41 y.o.   MRN: [id number]   Subjective:    Patient ID: [name] Nielsen, female    DOB: 21-Dec-1975, 41 y.o.   MRN: [id number]  HPI  Patient here for a scheduled follow up.  She has been having pain in her right elbow.  See previous note.  Was referred to ortho.  Has not seen the orthopedist yet.  Now having pain in her right hand/thumb.  Feels like cannot grip as well as she used to.  Taking narpoxen.  States otherwise doing well.  Handling stress.  No chest pain.  No sob.  No acid reflux.  No abdominal pain.     Past Medical History:  Diagnosis Date  . Anemia    hx of   . Anxiety   . Depression   . Frequent headaches    hx of  . Heart murmur   . Hx: UTI (urinary tract infection)    child, s/p urinary procecure  . Hypercholesterolemia   . Hypertension   . Hypothyroidism    Past Surgical History:  Procedure Laterality Date  . CHOLECYSTECTOMY  2012  . LAPAROSCOPIC GASTRIC SLEEVE RESECTION N/A 07/05/2014   Procedure: LAPAROSCOPIC GASTRIC SLEEVE RESECTION with upper endoscopy;  Surgeon: Wenda Low, MD;  Location: [facility];  Service: General;  Laterality: N/A;  . TUBAL LIGATION  1999   Family History  Problem Relation Age of Onset  . Cancer Mother        brain  . Diabetes Father   . Hypertension Father   . Kidney disease Father   . Stroke Father   . Cancer Father        prostate   Social History   Social History  . Marital status: Single    Spouse name: N/A  . Number of children: N/A  . Years of education: N/A   Social History Main Topics  . Smoking status: Never Smoker  . Smokeless tobacco: Never Used  . Alcohol use No  . Drug use: No  . Sexual activity: Not Asked   Other Topics Concern  . None   Social History Narrative  . None    Outpatient Encounter Prescriptions as of 02/11/2017  Medication Sig  . acetaminophen (TYLENOL) 500 MG tablet Take 1,000 mg by mouth every 6 (six) hours as needed for mild pain or  moderate pain.  Marland Kitchen ALPRAZolam (XANAX) 0.25 MG tablet Take 1 tablet (0.25 mg total) by mouth daily as needed for anxiety.  . Cyanocobalamin (B-12 PO) Take by mouth.  . Fe Fum-FePoly-Vit C-Vit B3 (INTEGRA) 62.5-62.5-40-3 MG CAPS Take 1 capsule by mouth daily.  . hydrochlorothiazide (MICROZIDE) 12.5 MG capsule Take 1 capsule (12.5 mg total) by mouth daily.  Marland Kitchen levothyroxine (SYNTHROID, LEVOTHROID) 150 MCG tablet TAKE ONE TABLET BY MOUTH ONCE DAILY BEFORE BREAKFAST  . Multiple Vitamins-Iron (MULTIVITAMINS WITH IRON) TABS tablet Take 1 tablet by mouth daily.  . naproxen (NAPROSYN) 500 MG tablet Take 1 tablet (500 mg total) by mouth 2 (two) times daily as needed.  . rosuvastatin (CRESTOR) 5 MG tablet TAKE ONE TABLET BY MOUTH ONCE DAILY  . traZODone (DESYREL) 150 MG tablet as needed.  . [DISCONTINUED] hydrochlorothiazide (HYDRODIURIL) 25 MG tablet TAKE ONE TABLET BY MOUTH ONCE DAILY  . [DISCONTINUED] lisinopril (PRINIVIL,ZESTRIL) 20 MG tablet TAKE ONE TABLET BY MOUTH ONCE DAILY  . lisinopril (PRINIVIL,ZESTRIL) 10 MG tablet Take 1 tablet (10 mg total) by mouth daily.   No facility-administered encounter medications on  file as of 02/11/2017.     Review of Systems  Constitutional: Negative for appetite change and unexpected weight change.  HENT: Negative for congestion and sinus pressure.   Respiratory: Negative for cough, chest tightness and shortness of breath.   Cardiovascular: Negative for chest pain, palpitations and leg swelling.  Gastrointestinal: Negative for abdominal pain, diarrhea, nausea and vomiting.  Genitourinary: Negative for difficulty urinating and dysuria.  Musculoskeletal: Negative for joint swelling.       Right hand and thumb pain.  Right arm pain and weakness.    Skin: Negative for color change and rash.  Neurological: Negative for dizziness, light-headedness and headaches.  Psychiatric/Behavioral: Negative for agitation and dysphoric mood.       Objective:     Blood  pressure rechecked by me:  108/68  Physical Exam  Constitutional: She appears well-developed and well-nourished. No distress.  HENT:  Nose: Nose normal.  Mouth/Throat: Oropharynx is clear and moist.  Neck: Neck supple. No thyromegaly present.  Cardiovascular: Normal rate and regular rhythm.   Pulmonary/Chest: Breath sounds normal. No respiratory distress. She has no wheezes.  Abdominal: Soft. Bowel sounds are normal. There is no tenderness.  Musculoskeletal: She exhibits no edema or tenderness.  Increased pain with rotation of her forearm.  Some pain with gripping.  Negative phalens and tinels.    Lymphadenopathy:    She has no cervical adenopathy.  Skin: No rash noted. No erythema.  Psychiatric: She has a normal mood and affect. Her behavior is normal.    BP (!) 98/58   Pulse 89   Temp 98 F (36.7 C) (Oral)   Wt 209 lb (94.8 kg)   LMP 02/07/2017 (Exact Date)   SpO2 98%   BMI 32.73 kg/m  Wt Readings from Last 3 Encounters:  02/11/17 209 lb (94.8 kg)  12/13/16 209 lb 12.8 oz (95.2 kg)  09/24/16 203 lb 3.2 oz (92.2 kg)     Lab Results  Component Value Date   WBC 6.3 06/14/2016   HGB 11.3 (L) 06/14/2016   HCT 33.6 (L) 06/14/2016   PLT 301.0 06/14/2016   GLUCOSE 97 06/14/2016   CHOL 167 06/14/2016   TRIG 71.0 06/14/2016   HDL 62.10 06/14/2016   LDLDIRECT 178.3 11/11/2013   LDLCALC 90 06/14/2016   ALT 16 06/14/2016   AST 14 06/14/2016   NA 135 06/14/2016   K 3.7 06/14/2016   CL 100 06/14/2016   CREATININE 0.89 06/14/2016   BUN 12 06/14/2016   CO2 30 06/14/2016   TSH 1.74 07/24/2016    Mm Screening Breast Tomo Bilateral  Result Date: 09/16/2016 CLINICAL DATA:  Screening. EXAM: 2D DIGITAL SCREENING BILATERAL MAMMOGRAM WITH CAD AND ADJUNCT TOMO COMPARISON:  Previous exam(s). ACR Breast Density Category b: There are scattered areas of fibroglandular density. FINDINGS: There are no findings suspicious for malignancy. Images were processed with CAD. IMPRESSION: No  mammographic evidence of malignancy. A result letter of this screening mammogram will be mailed directly to the patient. RECOMMENDATION: Screening mammogram in one year. (Code:SM-B-01Y) BI-RADS CATEGORY  1: Negative. Electronically Signed   By: Harmon PierJeffrey  Hu M.D.   On: 09/16/2016 12:47       Assessment & Plan:   Problem List Items Addressed This Visit    Essential hypertension, benign    Blood pressure running on the low side.  Decrease lisinopril to 10mg  q day.  Continue hctz.  Follow pressures.  Follow metabolic panel.        Relevant Medications   lisinopril (  PRINIVIL,ZESTRIL) 10 MG tablet   Hypercholesterolemia    Low cholesterol diet and exercise.  On crestor.  Follow lipid panel and liver function tests.        Relevant Medications   lisinopril (PRINIVIL,ZESTRIL) 10 MG tablet   Hypothyroidism    On thyroid replacement.  Follow tsh.        Obesity (BMI 30-39.9)    Diet and exercise.  Follow.        Right elbow pain    Has tried medications and splints.  Persistent pain.  Now with right hand pain and some perceived weakness.  Refer to ortho for further evaluation.  Also schedule nerve conduction studies.         Other Visit Diagnoses    Pain in both hands    -  Primary   Relevant Orders   Ambulatory referral to Neurology   Numbness and tingling       Relevant Orders   Ambulatory referral to Neurology       Dale Oakhurst, MD

## 2017-02-16 ENCOUNTER — Encounter: Payer: Self-pay | Admitting: Internal Medicine

## 2017-02-16 NOTE — Assessment & Plan Note (Signed)
Diet and exercise.  Follow.  

## 2017-02-16 NOTE — Assessment & Plan Note (Signed)
Low cholesterol diet and exercise.  On crestor.  Follow lipid panel and liver function tests.  

## 2017-02-16 NOTE — Assessment & Plan Note (Signed)
Blood pressure running on the low side.  Decrease lisinopril to 10mg  q day.  Continue hctz.  Follow pressures.  Follow metabolic panel.

## 2017-02-16 NOTE — Assessment & Plan Note (Signed)
On thyroid replacement.  Follow tsh.  

## 2017-02-16 NOTE — Assessment & Plan Note (Signed)
Has tried medications and splints.  Persistent pain.  Now with right hand pain and some perceived weakness.  Refer to ortho for further evaluation.  Also schedule nerve conduction studies.

## 2017-02-19 ENCOUNTER — Other Ambulatory Visit (INDEPENDENT_AMBULATORY_CARE_PROVIDER_SITE_OTHER): Payer: Managed Care, Other (non HMO)

## 2017-02-19 DIAGNOSIS — E7800 Pure hypercholesterolemia, unspecified: Secondary | ICD-10-CM | POA: Diagnosis not present

## 2017-02-19 DIAGNOSIS — D649 Anemia, unspecified: Secondary | ICD-10-CM | POA: Diagnosis not present

## 2017-02-19 DIAGNOSIS — I10 Essential (primary) hypertension: Secondary | ICD-10-CM

## 2017-02-19 LAB — BASIC METABOLIC PANEL
BUN: 12 mg/dL (ref 6–23)
CALCIUM: 9.3 mg/dL (ref 8.4–10.5)
CO2: 31 mEq/L (ref 19–32)
CREATININE: 0.85 mg/dL (ref 0.40–1.20)
Chloride: 102 mEq/L (ref 96–112)
GFR: 77.47 mL/min (ref >60.00)
Glucose, Bld: 100 mg/dL — ABNORMAL HIGH (ref 70–99)
Potassium: 3.9 mEq/L (ref 3.5–5.1)
Sodium: 138 mEq/L (ref 135–145)

## 2017-02-19 LAB — LIPID PANEL
CHOLESTEROL: 159 mg/dL (ref 0–200)
HDL: 64.7 mg/dL (ref >39.00)
LDL CALC: 80 mg/dL (ref 0–99)
NonHDL: 94.78
TRIGLYCERIDES: 73 mg/dL (ref 0.0–149.0)
Total CHOL/HDL Ratio: 2
VLDL: 14.6 mg/dL (ref 0.0–40.0)

## 2017-02-19 LAB — CBC WITH DIFFERENTIAL/PLATELET
Basophils Absolute: 0.1 10*3/uL (ref 0.0–0.1)
Basophils Relative: 1.3 % (ref 0.0–3.0)
EOS PCT: 4.7 % (ref 0.0–5.0)
Eosinophils Absolute: 0.2 10*3/uL (ref 0.0–0.7)
HCT: 35.5 % — ABNORMAL LOW (ref 36.0–46.0)
Hemoglobin: 11.5 g/dL — ABNORMAL LOW (ref 12.0–15.0)
LYMPHS ABS: 1.6 10*3/uL (ref 0.7–4.0)
Lymphocytes Relative: 33.2 % (ref 12.0–46.0)
MCHC: 32.5 g/dL (ref 30.0–36.0)
MCV: 71.4 fl — AB (ref 78.0–100.0)
MONOS PCT: 8.9 % (ref 3.0–12.0)
Monocytes Absolute: 0.4 10*3/uL (ref 0.1–1.0)
NEUTROS ABS: 2.5 10*3/uL (ref 1.4–7.7)
NEUTROS PCT: 51.9 % (ref 43.0–77.0)
PLATELETS: 288 10*3/uL (ref 150.0–400.0)
RBC: 4.97 Mil/uL (ref 3.87–5.11)
RDW: 14.4 % (ref 11.5–15.5)
WBC: 4.9 10*3/uL (ref 4.0–10.5)

## 2017-02-19 LAB — HEPATIC FUNCTION PANEL
ALT: 16 U/L (ref 0–35)
AST: 14 U/L (ref 0–37)
Albumin: 4.3 g/dL (ref 3.5–5.2)
Alkaline Phosphatase: 30 U/L — ABNORMAL LOW (ref 39–117)
BILIRUBIN TOTAL: 0.4 mg/dL (ref 0.2–1.2)
Bilirubin, Direct: 0.1 mg/dL (ref 0.0–0.3)
Total Protein: 6.8 g/dL (ref 6.0–8.3)

## 2017-02-19 LAB — FERRITIN: FERRITIN: 4.1 ng/mL — AB (ref 10.0–291.0)

## 2017-02-21 ENCOUNTER — Telehealth: Payer: Self-pay | Admitting: Internal Medicine

## 2017-02-21 MED ORDER — INTEGRA 62.5-62.5-40-3 MG PO CAPS: 1.0000 | ORAL_CAPSULE | Freq: Every day | ORAL | 1 refills | Status: DC

## 2017-02-21 NOTE — Telephone Encounter (Signed)
See lab note.  

## 2017-02-21 NOTE — Addendum Note (Signed)
Addended by: Donnamarie PoagHOMPSON, Camren Lipsett Y on: 02/21/2017 03:01 PM   Modules accepted: Orders

## 2017-02-21 NOTE — Telephone Encounter (Signed)
Pt called back returning your call. Please advise, thank you!  Call pt @ [phone]

## 2017-02-23 ENCOUNTER — Other Ambulatory Visit: Payer: Self-pay | Admitting: Internal Medicine

## 2017-02-23 DIAGNOSIS — D649 Anemia, unspecified: Secondary | ICD-10-CM

## 2017-02-23 NOTE — Progress Notes (Signed)
Order placed for f/u cbc and ferritin.   

## 2017-04-21 ENCOUNTER — Other Ambulatory Visit: Payer: Managed Care, Other (non HMO)

## 2017-04-21 ENCOUNTER — Other Ambulatory Visit (INDEPENDENT_AMBULATORY_CARE_PROVIDER_SITE_OTHER): Payer: Managed Care, Other (non HMO)

## 2017-04-21 DIAGNOSIS — D649 Anemia, unspecified: Secondary | ICD-10-CM | POA: Diagnosis not present

## 2017-04-21 LAB — CBC WITH DIFFERENTIAL/PLATELET
BASOS ABS: 0.1 10*3/uL (ref 0.0–0.1)
Basophils Relative: 0.9 % (ref 0.0–3.0)
EOS ABS: 0.2 10*3/uL (ref 0.0–0.7)
Eosinophils Relative: 1.9 % (ref 0.0–5.0)
HCT: 39.7 % (ref 36.0–46.0)
HEMOGLOBIN: 13.1 g/dL (ref 12.0–15.0)
LYMPHS ABS: 2.1 10*3/uL (ref 0.7–4.0)
Lymphocytes Relative: 23.2 % (ref 12.0–46.0)
MCHC: 32.9 g/dL (ref 30.0–36.0)
MCV: 74.5 fl — ABNORMAL LOW (ref 78.0–100.0)
MONO ABS: 0.7 10*3/uL (ref 0.1–1.0)
Monocytes Relative: 7.7 % (ref 3.0–12.0)
NEUTROS PCT: 66.3 % (ref 43.0–77.0)
Neutro Abs: 6.1 10*3/uL (ref 1.4–7.7)
Platelets: 349 10*3/uL (ref 150.0–400.0)
RBC: 5.33 Mil/uL — AB (ref 3.87–5.11)
RDW: 18.1 % — ABNORMAL HIGH (ref 11.5–15.5)
WBC: 9.2 10*3/uL (ref 4.0–10.5)

## 2017-04-21 LAB — FERRITIN: FERRITIN: 21.3 ng/mL (ref 10.0–291.0)

## 2017-04-22 ENCOUNTER — Other Ambulatory Visit: Payer: Self-pay | Admitting: Internal Medicine

## 2017-04-22 DIAGNOSIS — D649 Anemia, unspecified: Secondary | ICD-10-CM

## 2017-04-22 NOTE — Progress Notes (Signed)
Orders placed for f/u labs.  

## 2017-04-23 ENCOUNTER — Encounter: Payer: Self-pay | Admitting: *Deleted

## 2017-05-20 ENCOUNTER — Other Ambulatory Visit (INDEPENDENT_AMBULATORY_CARE_PROVIDER_SITE_OTHER): Payer: Managed Care, Other (non HMO)

## 2017-05-20 ENCOUNTER — Encounter: Payer: Self-pay | Admitting: Internal Medicine

## 2017-05-20 DIAGNOSIS — D649 Anemia, unspecified: Secondary | ICD-10-CM | POA: Diagnosis not present

## 2017-05-20 LAB — CBC WITH DIFFERENTIAL/PLATELET
BASOS PCT: 1 % (ref 0.0–3.0)
Basophils Absolute: 0.1 10*3/uL (ref 0.0–0.1)
EOS PCT: 1.8 % (ref 0.0–5.0)
Eosinophils Absolute: 0.1 10*3/uL (ref 0.0–0.7)
HCT: 40.5 % (ref 36.0–46.0)
HEMOGLOBIN: 13.1 g/dL (ref 12.0–15.0)
LYMPHS ABS: 2 10*3/uL (ref 0.7–4.0)
Lymphocytes Relative: 33.8 % (ref 12.0–46.0)
MCHC: 32.4 g/dL (ref 30.0–36.0)
MCV: 76.4 fl — ABNORMAL LOW (ref 78.0–100.0)
MONO ABS: 0.5 10*3/uL (ref 0.1–1.0)
Monocytes Relative: 8.6 % (ref 3.0–12.0)
NEUTROS ABS: 3.3 10*3/uL (ref 1.4–7.7)
NEUTROS PCT: 54.8 % (ref 43.0–77.0)
PLATELETS: 301 10*3/uL (ref 150.0–400.0)
RBC: 5.3 Mil/uL — ABNORMAL HIGH (ref 3.87–5.11)
RDW: 15.7 % — AB (ref 11.5–15.5)
WBC: 6 10*3/uL (ref 4.0–10.5)

## 2017-05-20 LAB — FERRITIN: FERRITIN: 18.1 ng/mL (ref 10.0–291.0)

## 2017-05-26 ENCOUNTER — Other Ambulatory Visit: Payer: Self-pay | Admitting: Internal Medicine

## 2017-06-18 ENCOUNTER — Ambulatory Visit (INDEPENDENT_AMBULATORY_CARE_PROVIDER_SITE_OTHER): Payer: Managed Care, Other (non HMO) | Admitting: Internal Medicine

## 2017-06-18 ENCOUNTER — Encounter: Payer: Self-pay | Admitting: Internal Medicine

## 2017-06-18 DIAGNOSIS — E039 Hypothyroidism, unspecified: Secondary | ICD-10-CM

## 2017-06-18 DIAGNOSIS — R7989 Other specified abnormal findings of blood chemistry: Secondary | ICD-10-CM

## 2017-06-18 DIAGNOSIS — I10 Essential (primary) hypertension: Secondary | ICD-10-CM

## 2017-06-18 DIAGNOSIS — Z9884 Bariatric surgery status: Secondary | ICD-10-CM

## 2017-06-18 DIAGNOSIS — R945 Abnormal results of liver function studies: Secondary | ICD-10-CM | POA: Diagnosis not present

## 2017-06-18 DIAGNOSIS — M25521 Pain in right elbow: Secondary | ICD-10-CM | POA: Diagnosis not present

## 2017-06-18 DIAGNOSIS — Z6833 Body mass index (BMI) 33.0-33.9, adult: Secondary | ICD-10-CM | POA: Diagnosis not present

## 2017-06-18 DIAGNOSIS — E7800 Pure hypercholesterolemia, unspecified: Secondary | ICD-10-CM

## 2017-06-18 MED ORDER — ROSUVASTATIN CALCIUM 5 MG PO TABS: 5.0000 mg | ORAL_TABLET | Freq: Every day | ORAL | 3 refills | Status: DC

## 2017-06-18 MED ORDER — LISINOPRIL 10 MG PO TABS: 10.0000 mg | ORAL_TABLET | Freq: Every day | ORAL | 1 refills | Status: DC

## 2017-06-18 MED ORDER — LEVOTHYROXINE SODIUM 150 MCG PO TABS: ORAL_TABLET | ORAL | 1 refills | Status: DC

## 2017-06-18 MED ORDER — HYDROCHLOROTHIAZIDE 12.5 MG PO CAPS: 12.5000 mg | ORAL_CAPSULE | Freq: Every day | ORAL | 1 refills | Status: DC

## 2017-06-18 NOTE — Progress Notes (Signed)
Patient ID: [name] Nielsen, female   DOB: 1976/06/08, 41 y.o.   MRN: [id number]   Subjective:    Patient ID: [name] DoloresElizabeth Sanjuan, female    DOB: 1976/06/08, 41 y.o.   MRN: [id number]  HPI  Patient here for a scheduled follow up.  She reports she is doing relatively well.  Increased stress.  Handling things well.  Taking lisinopril 10mg  q day.  Still on microzide.  Blood pressure doing well.  No headache.  No dizziness or light headedness.  No chest pain.  No sob.  No acid reflux.  No abdominal pain or cramping.  Bowels moving.  Had injection on her elbows.  Better.  Pain resolved.  Still with problems with her hands.  Diagnosed with mild CTS.  Using splints.  Helps if uses regularly.  Discussed further treatment.  She declines.     Past Medical History:  Diagnosis Date  . Anemia    hx of   . Anxiety   . Depression   . Frequent headaches    hx of  . Heart murmur   . Hx: UTI (urinary tract infection)    child, s/p urinary procecure  . Hypercholesterolemia   . Hypertension   . Hypothyroidism    Past Surgical History:  Procedure Laterality Date  . CHOLECYSTECTOMY  2012  . LAPAROSCOPIC GASTRIC SLEEVE RESECTION N/A 07/05/2014   Procedure: LAPAROSCOPIC GASTRIC SLEEVE RESECTION with upper endoscopy;  Surgeon: Wenda LowMatt Martin, MD;  Location: [facility];  Service: General;  Laterality: N/A;  . TUBAL LIGATION  1999   Family History  Problem Relation Age of Onset  . Cancer Mother        brain  . Diabetes Father   . Hypertension Father   . Kidney disease Father   . Stroke Father   . Cancer Father        prostate   Social History   Social History  . Marital status: Single    Spouse name: N/A  . Number of children: N/A  . Years of education: N/A   Social History Main Topics  . Smoking status: Never Smoker  . Smokeless tobacco: Never Used  . Alcohol use No  . Drug use: No  . Sexual activity: Not Asked   Other Topics Concern  . None   Social History Narrative  . None    Outpatient  Encounter Prescriptions as of 06/18/2017  Medication Sig  . acetaminophen (TYLENOL) 500 MG tablet Take 1,000 mg by mouth every 6 (six) hours as needed for mild pain or moderate pain.  Marland Kitchen. ALPRAZolam (XANAX) 0.25 MG tablet Take 1 tablet (0.25 mg total) by mouth daily as needed for anxiety.  . Cyanocobalamin (B-12 PO) Take by mouth.  . Fe Fum-FePoly-Vit C-Vit B3 (INTEGRA) 62.5-62.5-40-3 MG CAPS Take 1 capsule by mouth daily.  . hydrochlorothiazide (MICROZIDE) 12.5 MG capsule Take 1 capsule (12.5 mg total) by mouth daily.  Marland Kitchen. levothyroxine (SYNTHROID, LEVOTHROID) 150 MCG tablet TAKE ONE TABLET BY MOUTH ONCE DAILY BEFORE BREAKFAST  . lisinopril (PRINIVIL,ZESTRIL) 10 MG tablet Take 1 tablet (10 mg total) by mouth daily.  . Multiple Vitamins-Iron (MULTIVITAMINS WITH IRON) TABS tablet Take 1 tablet by mouth daily.  . naproxen (NAPROSYN) 500 MG tablet Take 1 tablet (500 mg total) by mouth 2 (two) times daily as needed.  . rosuvastatin (CRESTOR) 5 MG tablet Take 1 tablet (5 mg total) by mouth daily.  . traZODone (DESYREL) 150 MG tablet as needed.  . [DISCONTINUED] hydrochlorothiazide (MICROZIDE) 12.5 MG capsule Take  1 capsule (12.5 mg total) by mouth daily.  . [DISCONTINUED] levothyroxine (SYNTHROID, LEVOTHROID) 150 MCG tablet TAKE ONE TABLET BY MOUTH ONCE DAILY BEFORE BREAKFAST  . [DISCONTINUED] lisinopril (PRINIVIL,ZESTRIL) 10 MG tablet TAKE 1 TABLET BY MOUTH ONCE DAILY  . [DISCONTINUED] rosuvastatin (CRESTOR) 5 MG tablet TAKE ONE TABLET BY MOUTH ONCE DAILY   No facility-administered encounter medications on file as of 06/18/2017.     Review of Systems  Constitutional: Negative for appetite change and unexpected weight change.  HENT: Negative for congestion and sinus pressure.   Respiratory: Negative for cough, chest tightness and shortness of breath.   Cardiovascular: Negative for chest pain, palpitations and leg swelling.  Gastrointestinal: Negative for abdominal pain, diarrhea, nausea and vomiting.    Genitourinary: Negative for difficulty urinating and dysuria.  Musculoskeletal: Negative for joint swelling and myalgias.       Elbow pain better.  Hand issues as outlined.    Skin: Negative for color change and rash.  Neurological: Negative for dizziness, light-headedness and headaches.  Psychiatric/Behavioral: Negative for agitation and dysphoric mood.       Objective:     Pulse recheck:  84  Physical Exam  Constitutional: She appears well-developed and well-nourished. No distress.  HENT:  Nose: Nose normal.  Mouth/Throat: Oropharynx is clear and moist.  Neck: Neck supple. No thyromegaly present.  Cardiovascular: Normal rate and regular rhythm.   Pulmonary/Chest: Breath sounds normal. No respiratory distress. She has no wheezes.  Abdominal: Soft. Bowel sounds are normal. There is no tenderness.  Musculoskeletal: She exhibits no edema or tenderness.  Lymphadenopathy:    She has no cervical adenopathy.  Skin: No rash noted. No erythema.  Psychiatric: She has a normal mood and affect. Her behavior is normal.    BP 100/60 (BP Location: Left Arm, Patient Position: Sitting, Cuff Size: Large)   Pulse 84   Temp 98.7 F (37.1 C) (Oral)   Resp 14   Ht  (1.702 m)   Wt 215 lb 6.4 oz (97.7 kg)   SpO2 98%   BMI 33.74 kg/m  Wt Readings from Last 3 Encounters:  06/18/17 215 lb 6.4 oz (97.7 kg)  02/11/17 209 lb (94.8 kg)  12/13/16 209 lb 12.8 oz (95.2 kg)     Lab Results  Component Value Date   WBC 6.0 05/20/2017   HGB 13.1 05/20/2017   HCT 40.5 05/20/2017   PLT 301.0 05/20/2017   GLUCOSE 100 (H) 02/19/2017   CHOL 159 02/19/2017   TRIG 73.0 02/19/2017   HDL 64.70 02/19/2017   LDLDIRECT 178.3 11/11/2013   LDLCALC 80 02/19/2017   ALT 16 02/19/2017   AST 14 02/19/2017   NA 138 02/19/2017   K 3.9 02/19/2017   CL 102 02/19/2017   CREATININE 0.85 02/19/2017   BUN 12 02/19/2017   CO2 31 02/19/2017   TSH 1.74 07/24/2016    Mm Screening Breast Tomo  Bilateral  Result Date: 09/16/2016 CLINICAL DATA:  Screening. EXAM: 2D DIGITAL SCREENING BILATERAL MAMMOGRAM WITH CAD AND ADJUNCT TOMO COMPARISON:  Previous exam(s). ACR Breast Density Category b: There are scattered areas of fibroglandular density. FINDINGS: There are no findings suspicious for malignancy. Images were processed with CAD. IMPRESSION: No mammographic evidence of malignancy. A result letter of this screening mammogram will be mailed directly to the patient. RECOMMENDATION: Screening mammogram in one year. (Code:SM-B-01Y) BI-RADS CATEGORY  1: Negative. Electronically Signed   By: Harmon Pier M.D.   On: 09/16/2016 12:47       Assessment &  Plan:   Problem List Items Addressed This Visit    Abnormal liver function test    Continue diet and exercise.  Follow liver panel.        Relevant Orders   Hepatic function panel   BMI 33.0-33.9,adult    Discussed diet and exercise.  Follow.        Essential hypertension, benign    Blood pressure under good control.  Continue same medication regimen.  Follow pressures.  Follow metabolic panel.        Relevant Medications   hydrochlorothiazide (MICROZIDE) 12.5 MG capsule   lisinopril (PRINIVIL,ZESTRIL) 10 MG tablet   rosuvastatin (CRESTOR) 5 MG tablet   Other Relevant Orders   Basic metabolic panel   Hypercholesterolemia    Low cholesterol diet and exercise.  Follow lipid panel and liver function tests.  On crestor.        Relevant Medications   hydrochlorothiazide (MICROZIDE) 12.5 MG capsule   lisinopril (PRINIVIL,ZESTRIL) 10 MG tablet   rosuvastatin (CRESTOR) 5 MG tablet   Other Relevant Orders   Lipid panel   Hypothyroidism    On thyroid replacement.  Follow tsh.       Relevant Medications   levothyroxine (SYNTHROID, LEVOTHROID) 150 MCG tablet   Other Relevant Orders   TSH   Right elbow pain    Resolved after injection        Status post laparoscopic sleeve gastrectomy    Continue diet and exercise.         Relevant Orders   Ferritin   CBC with Differential/Platelet       Dale Hinsdale, MD

## 2017-06-20 ENCOUNTER — Encounter: Payer: Self-pay | Admitting: Internal Medicine

## 2017-06-20 NOTE — Assessment & Plan Note (Signed)
Blood pressure under good control.  Continue same medication regimen.  Follow pressures.  Follow metabolic panel.   

## 2017-06-20 NOTE — Assessment & Plan Note (Signed)
Low cholesterol diet and exercise.  Follow lipid panel and liver function tests.  On crestor.   

## 2017-06-20 NOTE — Assessment & Plan Note (Signed)
On thyroid replacement.  Follow tsh.  

## 2017-06-20 NOTE — Assessment & Plan Note (Signed)
Discussed diet and exercise.  Follow.  

## 2017-06-20 NOTE — Assessment & Plan Note (Signed)
Resolved after injection 

## 2017-06-20 NOTE — Assessment & Plan Note (Signed)
Continue diet and exercise. 

## 2017-06-20 NOTE — Assessment & Plan Note (Signed)
Continue diet and exercise. Follow liver panel.  

## 2017-07-29 ENCOUNTER — Encounter: Payer: Self-pay | Admitting: Internal Medicine

## 2017-08-03 ENCOUNTER — Other Ambulatory Visit: Payer: Self-pay | Admitting: Internal Medicine

## 2017-08-04 MED ORDER — ALPRAZOLAM 0.25 MG PO TABS: 0.2500 mg | ORAL_TABLET | Freq: Every day | ORAL | 0 refills | Status: DC | PRN

## 2017-09-17 ENCOUNTER — Other Ambulatory Visit: Payer: Managed Care, Other (non HMO)

## 2017-09-19 ENCOUNTER — Encounter: Payer: Managed Care, Other (non HMO) | Admitting: Internal Medicine

## 2017-09-23 ENCOUNTER — Encounter: Payer: Self-pay | Admitting: Internal Medicine

## 2017-09-23 NOTE — Telephone Encounter (Signed)
Called pt and scheduled

## 2017-10-25 ENCOUNTER — Other Ambulatory Visit: Payer: Self-pay | Admitting: Internal Medicine

## 2017-12-24 ENCOUNTER — Other Ambulatory Visit (INDEPENDENT_AMBULATORY_CARE_PROVIDER_SITE_OTHER): Payer: Managed Care, Other (non HMO)

## 2017-12-24 DIAGNOSIS — R7989 Other specified abnormal findings of blood chemistry: Secondary | ICD-10-CM

## 2017-12-24 DIAGNOSIS — E7800 Pure hypercholesterolemia, unspecified: Secondary | ICD-10-CM | POA: Diagnosis not present

## 2017-12-24 DIAGNOSIS — I10 Essential (primary) hypertension: Secondary | ICD-10-CM | POA: Diagnosis not present

## 2017-12-24 DIAGNOSIS — E039 Hypothyroidism, unspecified: Secondary | ICD-10-CM | POA: Diagnosis not present

## 2017-12-24 DIAGNOSIS — Z9884 Bariatric surgery status: Secondary | ICD-10-CM

## 2017-12-24 DIAGNOSIS — R945 Abnormal results of liver function studies: Secondary | ICD-10-CM | POA: Diagnosis not present

## 2017-12-24 LAB — BASIC METABOLIC PANEL
BUN: 13 mg/dL (ref 6–23)
CHLORIDE: 104 meq/L (ref 96–112)
CO2: 24 mEq/L (ref 19–32)
Calcium: 9.8 mg/dL (ref 8.4–10.5)
Creatinine, Ser: 0.82 mg/dL (ref 0.40–1.20)
GFR: 80.43 mL/min (ref >60.00)
Glucose, Bld: 104 mg/dL — ABNORMAL HIGH (ref 70–99)
POTASSIUM: 3.9 meq/L (ref 3.5–5.1)
SODIUM: 141 meq/L (ref 135–145)

## 2017-12-24 LAB — CBC WITH DIFFERENTIAL/PLATELET
BASOS ABS: 0.1 10*3/uL (ref 0.0–0.1)
Basophils Relative: 1.1 % (ref 0.0–3.0)
EOS ABS: 0.2 10*3/uL (ref 0.0–0.7)
Eosinophils Relative: 2.5 % (ref 0.0–5.0)
HCT: 34.8 % — ABNORMAL LOW (ref 36.0–46.0)
Hemoglobin: 11.5 g/dL — ABNORMAL LOW (ref 12.0–15.0)
Lymphocytes Relative: 32.1 % (ref 12.0–46.0)
Lymphs Abs: 2 10*3/uL (ref 0.7–4.0)
MCHC: 33.1 g/dL (ref 30.0–36.0)
MCV: 71.8 fl — ABNORMAL LOW (ref 78.0–100.0)
MONO ABS: 0.5 10*3/uL (ref 0.1–1.0)
Monocytes Relative: 7.7 % (ref 3.0–12.0)
NEUTROS ABS: 3.5 10*3/uL (ref 1.4–7.7)
Neutrophils Relative %: 56.6 % (ref 43.0–77.0)
PLATELETS: 235 10*3/uL (ref 150.0–400.0)
RBC: 4.84 Mil/uL (ref 3.87–5.11)
RDW: 14.3 % (ref 11.5–15.5)
WBC: 6.2 10*3/uL (ref 4.0–10.5)

## 2017-12-24 LAB — HEPATIC FUNCTION PANEL
ALBUMIN: 4.5 g/dL (ref 3.5–5.2)
ALK PHOS: 31 U/L — AB (ref 39–117)
ALT: 32 U/L (ref 0–35)
AST: 25 U/L (ref 0–37)
Bilirubin, Direct: 0.1 mg/dL (ref 0.0–0.3)
TOTAL PROTEIN: 7 g/dL (ref 6.0–8.3)
Total Bilirubin: 0.3 mg/dL (ref 0.2–1.2)

## 2017-12-24 LAB — LIPID PANEL
CHOLESTEROL: 205 mg/dL — AB (ref 0–200)
HDL: 74.6 mg/dL (ref >39.00)
LDL Cholesterol: 113 mg/dL — ABNORMAL HIGH (ref 0–99)
NONHDL: 129.91
TRIGLYCERIDES: 87 mg/dL (ref 0.0–149.0)
Total CHOL/HDL Ratio: 3
VLDL: 17.4 mg/dL (ref 0.0–40.0)

## 2017-12-24 LAB — TSH: TSH: 1.93 u[IU]/mL (ref 0.35–4.50)

## 2017-12-24 LAB — FERRITIN: FERRITIN: 7.9 ng/mL — AB (ref 10.0–291.0)

## 2017-12-25 ENCOUNTER — Ambulatory Visit (INDEPENDENT_AMBULATORY_CARE_PROVIDER_SITE_OTHER): Payer: Managed Care, Other (non HMO) | Admitting: Internal Medicine

## 2017-12-25 VITALS — BP 118/74 | HR 90 | Temp 98.5°F | Resp 18 | Ht 67.0 in | Wt 215.8 lb

## 2017-12-25 DIAGNOSIS — F330 Major depressive disorder, recurrent, mild: Secondary | ICD-10-CM | POA: Diagnosis not present

## 2017-12-25 DIAGNOSIS — Z1239 Encounter for other screening for malignant neoplasm of breast: Secondary | ICD-10-CM

## 2017-12-25 DIAGNOSIS — E7800 Pure hypercholesterolemia, unspecified: Secondary | ICD-10-CM | POA: Diagnosis not present

## 2017-12-25 DIAGNOSIS — Z1231 Encounter for screening mammogram for malignant neoplasm of breast: Secondary | ICD-10-CM | POA: Diagnosis not present

## 2017-12-25 DIAGNOSIS — D649 Anemia, unspecified: Secondary | ICD-10-CM | POA: Diagnosis not present

## 2017-12-25 DIAGNOSIS — R945 Abnormal results of liver function studies: Secondary | ICD-10-CM | POA: Diagnosis not present

## 2017-12-25 DIAGNOSIS — I10 Essential (primary) hypertension: Secondary | ICD-10-CM | POA: Diagnosis not present

## 2017-12-25 DIAGNOSIS — Z0000 Encounter for general adult medical examination without abnormal findings: Secondary | ICD-10-CM | POA: Diagnosis not present

## 2017-12-25 DIAGNOSIS — R7989 Other specified abnormal findings of blood chemistry: Secondary | ICD-10-CM

## 2017-12-25 DIAGNOSIS — E039 Hypothyroidism, unspecified: Secondary | ICD-10-CM

## 2017-12-25 DIAGNOSIS — Z6833 Body mass index (BMI) 33.0-33.9, adult: Secondary | ICD-10-CM

## 2017-12-25 NOTE — Progress Notes (Signed)
Patient ID: [name] Nielsen, female   DOB: 1976/06/21, 42 y.o.   MRN: [id number]   Subjective:    Patient ID: [name] DoloresElizabeth Nielsen, female    DOB: 1976/06/21, 42 y.o.   MRN: [id number]  HPI  Patient here for her physical exam.  She has been under increased stress recently.  Changed positions.  Increased stress with this, but likes her new job.  Discussed with her today.  Does not feel she needs anything more at this time.  No chest pain.  No sob.  No acid reflux.  No abdominal pain.  Bowels moving.  Off iron.  Discussed lab results.  hgb 11.5.  Cholesterol increased some.  Discussed low cholesterol diet and exercise.     Past Medical History:  Diagnosis Date  . Anemia    hx of   . Anxiety   . Depression   . Frequent headaches    hx of  . Heart murmur   . Hx: UTI (urinary tract infection)    child, s/p urinary procecure  . Hypercholesterolemia   . Hypertension   . Hypothyroidism    Past Surgical History:  Procedure Laterality Date  . CHOLECYSTECTOMY  2012  . LAPAROSCOPIC GASTRIC SLEEVE RESECTION N/A 07/05/2014   Procedure: LAPAROSCOPIC GASTRIC SLEEVE RESECTION with upper endoscopy;  Surgeon: Wenda LowMatt Martin, MD;  Location: [facility];  Service: General;  Laterality: N/A;  . TUBAL LIGATION  1999   Family History  Problem Relation Age of Onset  . Cancer Mother        brain  . Diabetes Father   . Hypertension Father   . Kidney disease Father   . Stroke Father   . Cancer Father        prostate   Social History   Socioeconomic History  . Marital status: Single    Spouse name: Not on file  . Number of children: Not on file  . Years of education: Not on file  . Highest education level: Not on file  Occupational History  . Not on file  Social Needs  . Financial resource strain: Not on file  . Food insecurity:    Worry: Not on file    Inability: Not on file  . Transportation needs:    Medical: Not on file    Non-medical: Not on file  Tobacco Use  . Smoking status: Never Smoker  .  Smokeless tobacco: Never Used  Substance and Sexual Activity  . Alcohol use: No    Alcohol/week: 0.0 oz  . Drug use: No  . Sexual activity: Not on file  Lifestyle  . Physical activity:    Days per week: Not on file    Minutes per session: Not on file  . Stress: Not on file  Relationships  . Social connections:    Talks on phone: Not on file    Gets together: Not on file    Attends religious service: Not on file    Active member of club or organization: Not on file    Attends meetings of clubs or organizations: Not on file    Relationship status: Not on file  Other Topics Concern  . Not on file  Social History Narrative  . Not on file    Outpatient Encounter Medications as of 12/25/2017  Medication Sig  . acetaminophen (TYLENOL) 500 MG tablet Take 1,000 mg by mouth every 6 (six) hours as needed for mild pain or moderate pain.  Marland Kitchen. ALPRAZolam (XANAX) 0.25 MG tablet Take 1 tablet (0.25  mg total) by mouth daily as needed for anxiety.  . Cyanocobalamin (B-12 PO) Take by mouth.  . hydrochlorothiazide (MICROZIDE) 12.5 MG capsule Take 1 capsule (12.5 mg total) by mouth daily.  Marland Kitchen levothyroxine (SYNTHROID, LEVOTHROID) 150 MCG tablet TAKE ONE TABLET BY MOUTH ONCE DAILY BEFORE BREAKFAST  . lisinopril (PRINIVIL,ZESTRIL) 10 MG tablet Take 1 tablet (10 mg total) by mouth daily.  . Multiple Vitamins-Iron (MULTIVITAMINS WITH IRON) TABS tablet Take 1 tablet by mouth daily.  . rosuvastatin (CRESTOR) 5 MG tablet Take 1 tablet (5 mg total) by mouth daily.  . Fe Fum-FePoly-Vit C-Vit B3 (INTEGRA) 62.5-62.5-40-3 MG CAPS Take 1 capsule by mouth daily. (Patient not taking: Reported on 12/25/2017)  . naproxen (NAPROSYN) 500 MG tablet Take 1 tablet (500 mg total) by mouth 2 (two) times daily as needed. (Patient not taking: Reported on 12/25/2017)  . traZODone (DESYREL) 150 MG tablet as needed.   No facility-administered encounter medications on file as of 12/25/2017.     Review of Systems  Constitutional:  Negative for appetite change and unexpected weight change.  HENT: Negative for congestion and sinus pressure.   Eyes: Negative for pain and visual disturbance.  Respiratory: Negative for cough, chest tightness and shortness of breath.   Cardiovascular: Negative for chest pain, palpitations and leg swelling.  Gastrointestinal: Negative for abdominal pain, diarrhea, nausea and vomiting.  Genitourinary: Negative for difficulty urinating and dysuria.  Musculoskeletal: Negative for joint swelling and myalgias.  Skin: Negative for color change and rash.  Neurological: Negative for dizziness, light-headedness and headaches.  Hematological: Negative for adenopathy. Does not bruise/bleed easily.  Psychiatric/Behavioral: Negative for agitation and dysphoric mood.       Increased stress as outlined.         Objective:     Blood pressure rechecked by me:  116/74  Physical Exam  Constitutional: She is oriented to person, place, and time. She appears well-developed and well-nourished. No distress.  HENT:  Nose: Nose normal.  Mouth/Throat: Oropharynx is clear and moist.  Eyes: Right eye exhibits no discharge. Left eye exhibits no discharge. No scleral icterus.  Neck: Neck supple. No thyromegaly present.  Cardiovascular: Normal rate and regular rhythm.  Pulmonary/Chest: Breath sounds normal. No accessory muscle usage. No tachypnea. No respiratory distress. She has no decreased breath sounds. She has no wheezes. She has no rhonchi. Right breast exhibits no inverted nipple, no mass, no nipple discharge and no tenderness (no axillary adenopathy). Left breast exhibits no inverted nipple, no mass, no nipple discharge and no tenderness (no axilarry adenopathy).  Abdominal: Soft. Bowel sounds are normal. There is no tenderness.  Musculoskeletal: She exhibits no edema or tenderness.  Lymphadenopathy:    She has no cervical adenopathy.  Neurological: She is alert and oriented to person, place, and time.    Skin: Skin is warm. No rash noted. No erythema.  Psychiatric: She has a normal mood and affect. Her behavior is normal.    BP 118/74 (BP Location: Left Arm, Patient Position: Sitting, Cuff Size: Large)   Pulse 90   Temp 98.5 F (36.9 C) (Oral)   Resp 18   Ht 5\' 7"  (1.702 m)   Wt 215 lb 12.8 oz (97.9 kg)   SpO2 98%   BMI 33.80 kg/m  Wt Readings from Last 3 Encounters:  12/25/17 215 lb 12.8 oz (97.9 kg)  06/18/17 215 lb 6.4 oz (97.7 kg)  02/11/17 209 lb (94.8 kg)     Lab Results  Component Value Date   WBC  6.2 12/24/2017   HGB 11.5 (L) 12/24/2017   HCT 34.8 (L) 12/24/2017   PLT 235.0 12/24/2017   GLUCOSE 104 (H) 12/24/2017   CHOL 205 (H) 12/24/2017   TRIG 87.0 12/24/2017   HDL 74.60 12/24/2017   LDLDIRECT 178.3 11/11/2013   LDLCALC 113 (H) 12/24/2017   ALT 32 12/24/2017   AST 25 12/24/2017   NA 141 12/24/2017   K 3.9 12/24/2017   CL 104 12/24/2017   CREATININE 0.82 12/24/2017   BUN 13 12/24/2017   CO2 24 12/24/2017   TSH 1.93 12/24/2017    Mm Screening Breast Tomo Bilateral  Result Date: 09/16/2016 CLINICAL DATA:  Screening. EXAM: 2D DIGITAL SCREENING BILATERAL MAMMOGRAM WITH CAD AND ADJUNCT TOMO COMPARISON:  Previous exam(s). ACR Breast Density Category b: There are scattered areas of fibroglandular density. FINDINGS: There are no findings suspicious for malignancy. Images were processed with CAD. IMPRESSION: No mammographic evidence of malignancy. A result letter of this screening mammogram will be mailed directly to the patient. RECOMMENDATION: Screening mammogram in one year. (Code:SM-B-01Y) BI-RADS CATEGORY  1: Negative. Electronically Signed   By: Harmon Pier M.D.   On: 09/16/2016 12:47       Assessment & Plan:   Problem List Items Addressed This Visit    Abnormal liver function test    Have discussed diet and exercise.  Follow liver panel.       Anemia    S/p gastric surgery.  Recent hgb slightly decreased.  Restart iron.  Follow cbc.        Relevant Orders   CBC with Differential/Platelet   Ferritin   BMI 33.0-33.9,adult    Diet and exercise.  Follow.       Essential hypertension, benign    Blood pressure under good control.  Continue same medication regimen.  Follow pressures.  Follow metabolic panel.        Health care maintenance    Physical today 12/25/17.  mammogram 09/16/16 - Birads I.  PAP 06/14/16- negative with negative HPV.  Schedule mammogram.        Hypercholesterolemia    Low cholesterol diet and exercise.  Follow lipid panel.        Hypothyroidism    On thyroid replacement.  Follow tsh.        Major depressive disorder, recurrent episode (HCC)    With increased stress.  Discussed counseling.  Overall she feels she is handling things relatively well.  Follow.         Other Visit Diagnoses    Breast cancer screening    -  Primary   Relevant Orders   MM Digital Screening       Dale Glencoe, MD

## 2017-12-25 NOTE — Assessment & Plan Note (Signed)
Physical today 12/25/17.  mammogram 09/16/16 - Birads I.  PAP 06/14/16- negative with negative HPV.  Schedule mammogram.

## 2017-12-28 ENCOUNTER — Encounter: Payer: Self-pay | Admitting: Internal Medicine

## 2017-12-28 NOTE — Assessment & Plan Note (Signed)
On thyroid replacement.  Follow tsh.  

## 2017-12-28 NOTE — Assessment & Plan Note (Signed)
S/p gastric surgery.  Recent hgb slightly decreased.  Restart iron.  Follow cbc.

## 2017-12-28 NOTE — Assessment & Plan Note (Signed)
Have discussed diet and exercise.  Follow liver panel.   

## 2017-12-28 NOTE — Assessment & Plan Note (Signed)
Blood pressure under good control.  Continue same medication regimen.  Follow pressures.  Follow metabolic panel.   

## 2017-12-28 NOTE — Assessment & Plan Note (Signed)
With increased stress.  Discussed counseling.  Overall she feels she is handling things relatively well.  Follow.

## 2017-12-28 NOTE — Assessment & Plan Note (Signed)
Low cholesterol diet and exercise.  Follow lipid panel.   

## 2017-12-28 NOTE — Assessment & Plan Note (Signed)
Diet and exercise.  Follow.  

## 2018-01-09 ENCOUNTER — Ambulatory Visit
Admission: RE | Admit: 2018-01-09 | Discharge: 2018-01-09 | Disposition: A | Discharge status: Home / Self Care | Payer: Managed Care, Other (non HMO) | Source: Ambulatory Visit | Attending: Internal Medicine | Admitting: Internal Medicine

## 2018-01-09 DIAGNOSIS — R928 Other abnormal and inconclusive findings on diagnostic imaging of breast: Secondary | ICD-10-CM | POA: Diagnosis not present

## 2018-01-09 DIAGNOSIS — Z1239 Encounter for other screening for malignant neoplasm of breast: Secondary | ICD-10-CM

## 2018-01-09 DIAGNOSIS — Z1231 Encounter for screening mammogram for malignant neoplasm of breast: Secondary | ICD-10-CM | POA: Diagnosis present

## 2018-01-12 ENCOUNTER — Other Ambulatory Visit: Payer: Self-pay | Admitting: Internal Medicine

## 2018-01-12 DIAGNOSIS — R928 Other abnormal and inconclusive findings on diagnostic imaging of breast: Secondary | ICD-10-CM

## 2018-01-12 NOTE — Progress Notes (Signed)
Order placed for f/u left breast mammogram and ultrasound.   

## 2018-01-15 ENCOUNTER — Ambulatory Visit
Admission: RE | Admit: 2018-01-15 | Discharge: 2018-01-15 | Disposition: A | Discharge status: Home / Self Care | Payer: Managed Care, Other (non HMO) | Source: Ambulatory Visit | Attending: Internal Medicine | Admitting: Internal Medicine

## 2018-01-15 DIAGNOSIS — R928 Other abnormal and inconclusive findings on diagnostic imaging of breast: Secondary | ICD-10-CM | POA: Diagnosis not present

## 2018-02-04 ENCOUNTER — Encounter (HOSPITAL_COMMUNITY): Payer: Self-pay

## 2018-02-10 ENCOUNTER — Other Ambulatory Visit: Payer: Self-pay | Admitting: Internal Medicine

## 2018-02-21 ENCOUNTER — Encounter: Payer: Self-pay | Admitting: Internal Medicine

## 2018-02-21 DIAGNOSIS — E559 Vitamin D deficiency, unspecified: Secondary | ICD-10-CM

## 2018-02-24 ENCOUNTER — Other Ambulatory Visit (INDEPENDENT_AMBULATORY_CARE_PROVIDER_SITE_OTHER): Payer: Managed Care, Other (non HMO)

## 2018-02-24 DIAGNOSIS — D649 Anemia, unspecified: Secondary | ICD-10-CM | POA: Diagnosis not present

## 2018-02-24 LAB — CBC WITH DIFFERENTIAL/PLATELET
Basophils Absolute: 0.1 10*3/uL (ref 0.0–0.1)
Basophils Relative: 0.9 % (ref 0.0–3.0)
EOS PCT: 2.2 % (ref 0.0–5.0)
Eosinophils Absolute: 0.1 10*3/uL (ref 0.0–0.7)
HEMATOCRIT: 38.3 % (ref 36.0–46.0)
Hemoglobin: 12.5 g/dL (ref 12.0–15.0)
LYMPHS ABS: 1.8 10*3/uL (ref 0.7–4.0)
Lymphocytes Relative: 27.4 % (ref 12.0–46.0)
MCHC: 32.6 g/dL (ref 30.0–36.0)
MCV: 76.1 fl — AB (ref 78.0–100.0)
MONOS PCT: 7.9 % (ref 3.0–12.0)
Monocytes Absolute: 0.5 10*3/uL (ref 0.1–1.0)
Neutro Abs: 4 10*3/uL (ref 1.4–7.7)
Neutrophils Relative %: 61.6 % (ref 43.0–77.0)
Platelets: 271 10*3/uL (ref 150.0–400.0)
RBC: 5.03 Mil/uL (ref 3.87–5.11)
RDW: 16.8 % — ABNORMAL HIGH (ref 11.5–15.5)
WBC: 6.6 10*3/uL (ref 4.0–10.5)

## 2018-02-24 LAB — FERRITIN: FERRITIN: 12.6 ng/mL (ref 10.0–291.0)

## 2018-02-25 ENCOUNTER — Encounter: Payer: Self-pay | Admitting: Internal Medicine

## 2018-02-25 NOTE — Telephone Encounter (Signed)
I have placed the order for the vitamin D.  I did not get this message until 02/24/18.  She had labs drawn already. Anyway to add vitamin D level to blood drawn.  I have placed the order.  Thanks

## 2018-02-26 ENCOUNTER — Other Ambulatory Visit (INDEPENDENT_AMBULATORY_CARE_PROVIDER_SITE_OTHER): Payer: Managed Care, Other (non HMO)

## 2018-02-26 DIAGNOSIS — E559 Vitamin D deficiency, unspecified: Secondary | ICD-10-CM

## 2018-02-26 LAB — VITAMIN D 25 HYDROXY (VIT D DEFICIENCY, FRACTURES): VITD: 64.99 ng/mL (ref 30.00–100.00)

## 2018-02-27 ENCOUNTER — Encounter: Payer: Self-pay | Admitting: Internal Medicine

## 2018-03-06 ENCOUNTER — Other Ambulatory Visit: Payer: Self-pay | Admitting: Internal Medicine

## 2018-04-01 ENCOUNTER — Encounter: Payer: Self-pay | Admitting: Internal Medicine

## 2018-04-16 ENCOUNTER — Encounter: Payer: Self-pay | Admitting: Internal Medicine

## 2018-04-19 ENCOUNTER — Other Ambulatory Visit: Payer: Self-pay | Admitting: Internal Medicine

## 2018-04-30 ENCOUNTER — Ambulatory Visit: Payer: Managed Care, Other (non HMO) | Admitting: Internal Medicine

## 2018-05-14 ENCOUNTER — Encounter: Payer: Self-pay | Admitting: Internal Medicine

## 2018-05-14 NOTE — Telephone Encounter (Signed)
Patient is scheduled for 05/15/18 with NP.

## 2018-05-15 ENCOUNTER — Encounter: Payer: Self-pay | Admitting: Family Medicine

## 2018-05-15 ENCOUNTER — Ambulatory Visit: Payer: Managed Care, Other (non HMO) | Admitting: Family Medicine

## 2018-05-15 VITALS — BP 114/72 | HR 82 | Temp 97.8°F | Resp 18 | Ht 67.0 in | Wt 216.5 lb

## 2018-05-15 DIAGNOSIS — N390 Urinary tract infection, site not specified: Secondary | ICD-10-CM

## 2018-05-15 DIAGNOSIS — R300 Dysuria: Secondary | ICD-10-CM | POA: Diagnosis not present

## 2018-05-15 LAB — POCT URINALYSIS DIPSTICK
Bilirubin, UA: NEGATIVE
Blood, UA: POSITIVE
GLUCOSE UA: NEGATIVE
Ketones, UA: NEGATIVE
LEUKOCYTES UA: NEGATIVE
Nitrite, UA: NEGATIVE
Protein, UA: NEGATIVE
Spec Grav, UA: 1.015 (ref 1.010–1.025)
Urobilinogen, UA: 0.2 E.U./dL
pH, UA: 6 (ref 5.0–8.0)

## 2018-05-15 MED ORDER — PHENAZOPYRIDINE HCL 100 MG PO TABS: 100.0000 mg | ORAL_TABLET | Freq: Three times a day (TID) | ORAL | 0 refills | Status: DC | PRN

## 2018-05-15 MED ORDER — SULFAMETHOXAZOLE-TRIMETHOPRIM 800-160 MG PO TABS: 1.0000 | ORAL_TABLET | Freq: Two times a day (BID) | ORAL | 0 refills | Status: AC

## 2018-05-15 NOTE — Patient Instructions (Signed)
Great to meet you!  Increase water intake, avoid excess sugary/caffeine/alcohol type beverages as these can make UTI symptoms worse

## 2018-05-15 NOTE — Progress Notes (Signed)
   Subjective:    Patient ID: [name] Nielsen, female    DOB: 1976/01/30, 42 y.o.   MRN: [id number]  HPI  Presents to clinic c/o UTI symptoms for past 2-3 days. States as a younger person she got UTIs frequently and even had to see urology. Now she may have 1 or 2 UTIs a year, very mild.  Denies fever or chills. Denies nausea, vomiting or diarrhea.   Patient Active Problem List   Diagnosis Date Noted  . Right elbow pain 09/24/2016  . Arm swelling 12/25/2015  . Major depressive disorder, recurrent episode (HCC) 07/10/2015  . Insomnia 07/10/2015  . Poor concentration 01/22/2015  . Health care maintenance 11/20/2014  . UTI (urinary tract infection) 08/27/2014  . 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 Abnormal liver function test 02/06/2014  . BMI 33.0-33.9,adult 10/06/2013  . Anemia 07/05/2013  . Headache(784.0) 05/05/2013  . Essential hypertension, benign 05/05/2013  . Hypercholesterolemia 05/05/2013  . Hypothyroidism 05/05/2013   Social History   Tobacco Use  . Smoking status: Never Smoker  . Smokeless tobacco: Never Used  Substance Use Topics  . Alcohol use: No    Alcohol/week: 0.0 standard drinks   Review of Systems  Constitutional: Negative for chills, fatigue and fever.  HENT: Negative.   Respiratory: Negative for cough, shortness of breath and wheezing.   Cardiovascular: Negative for chest pain and palpitations.  Gastrointestinal: Positive for abdominal pain. Negative for diarrhea, nausea and vomiting.       Lower ABD pressure  Genitourinary: Positive for dysuria, frequency and urgency.  Musculoskeletal: Negative.   Skin: Negative for color change and pallor.       Objective:   Physical Exam  Constitutional: She is oriented to person, place, and time. She appears well-developed and well-nourished. No distress.  HENT:  Head: Normocephalic and atraumatic.  Eyes: EOM are normal. No scleral icterus.  Cardiovascular: Normal rate, regular rhythm  and normal heart sounds.  Pulmonary/Chest: Effort normal and breath sounds normal. No respiratory distress. She has no wheezes. She has no rales.  Abdominal: Soft. Bowel sounds are normal. She exhibits no distension. There is tenderness.  +suprapubic tenderness  Neurological: She is alert and oriented to person, place, and time.  Gait normal  Skin: No pallor.  Psychiatric: She has a normal mood and affect. Her behavior is normal.  Nursing note and vitals reviewed.    Blood pressure 114/72, pulse 82, temperature 97.8 F (36.6 C), temperature source Oral, resp. rate 18, height 5\' 7"  (1.702 m), weight 216 lb 8 oz (98.2 kg), SpO2 99 %.  Assessment & Plan:   UTI - Bactrim BID for 3 days. Increase water intake, avoid excess sugary/caffeine/alcohol type beverages as these can make UTI symptoms worse.   Dysuria - Use pyridium as needed for painful urination. Patient made aware that this medication may turn urine an orange color.   Keep appt at end of August as already scheduled.

## 2018-05-17 LAB — URINE CULTURE
MICRO NUMBER:: 90945688
SPECIMEN QUALITY:: ADEQUATE

## 2018-05-20 MED ORDER — SULFAMETHOXAZOLE-TRIMETHOPRIM 800-160 MG PO TABS: 1.0000 | ORAL_TABLET | Freq: Two times a day (BID) | ORAL | 0 refills | Status: AC

## 2018-05-20 NOTE — Addendum Note (Signed)
Addended by: Leanora CoverGUSE, Kaimen Peine on: 05/20/2018 08:11 AM   Modules accepted: Orders

## 2018-06-01 ENCOUNTER — Encounter: Payer: Self-pay | Admitting: Internal Medicine

## 2018-06-01 ENCOUNTER — Ambulatory Visit (INDEPENDENT_AMBULATORY_CARE_PROVIDER_SITE_OTHER): Payer: Managed Care, Other (non HMO) | Admitting: Internal Medicine

## 2018-06-01 DIAGNOSIS — Z9884 Bariatric surgery status: Secondary | ICD-10-CM

## 2018-06-01 DIAGNOSIS — E039 Hypothyroidism, unspecified: Secondary | ICD-10-CM

## 2018-06-01 DIAGNOSIS — R7989 Other specified abnormal findings of blood chemistry: Secondary | ICD-10-CM

## 2018-06-01 DIAGNOSIS — R945 Abnormal results of liver function studies: Secondary | ICD-10-CM

## 2018-06-01 DIAGNOSIS — I10 Essential (primary) hypertension: Secondary | ICD-10-CM | POA: Diagnosis not present

## 2018-06-01 DIAGNOSIS — E7800 Pure hypercholesterolemia, unspecified: Secondary | ICD-10-CM

## 2018-06-01 DIAGNOSIS — D649 Anemia, unspecified: Secondary | ICD-10-CM

## 2018-06-01 DIAGNOSIS — F330 Major depressive disorder, recurrent, mild: Secondary | ICD-10-CM

## 2018-06-01 MED ORDER — SERTRALINE HCL 50 MG PO TABS: 50.0000 mg | ORAL_TABLET | Freq: Every day | ORAL | 2 refills | Status: DC

## 2018-06-01 NOTE — Patient Instructions (Signed)
Start zoloft 50mg .  Take 1/2 tablet per day for one week and then increase to one per day.

## 2018-06-01 NOTE — Progress Notes (Signed)
Patient ID: [name] Nielsen, female   DOB: [birth year], 42 y.o.   MRN: [id number]   Subjective:    Patient ID: [name] Nielsen, female    DOB: [birth year], 42 y.o.   MRN: [id number]  HPI  Patient here for a scheduled follow up.  She reports increased stress.  Increased stress with her job.  Discussed with her today.  She reports feeling down at times.  Has previously been on zoloft.  This worked well for her.  Had some trouble tapering off, but did well on the medication.  Feels needs to be back on something.  Discussed treatment options.  Tries to stay active.  No chest pain.  No sob.  No acid reflux.  No abdominal pain.  Bowels moving.  No dizziness.  Blood pressure doing well.     Past Medical History:  Diagnosis Date  . Anemia    hx of   . Anxiety   . Depression   . Frequent headaches    hx of  . Heart murmur   . Hx: UTI (urinary tract infection)    child, s/p urinary procecure  . Hypercholesterolemia   . Hypertension   . Hypothyroidism    Past Surgical History:  Procedure Laterality Date  . CHOLECYSTECTOMY  2012  . LAPAROSCOPIC GASTRIC SLEEVE RESECTION N/A 07/05/2014   Procedure: LAPAROSCOPIC GASTRIC SLEEVE RESECTION with upper endoscopy;  Surgeon: Wenda LowMatt Martin, MD;  Location: [facility];  Service: General;  Laterality: N/A;  . TUBAL LIGATION  1999   Family History  Problem Relation Age of Onset  . Cancer Mother        brain  . Diabetes Father   . Hypertension Father   . Kidney disease Father   . Stroke Father   . Cancer Father        prostate  . Breast cancer Neg Hx    Social History   Socioeconomic History  . Marital status: Single    Spouse name: Not on file  . Number of children: Not on file  . Years of education: Not on file  . Highest education level: Not on file  Occupational History  . Not on file  Social Needs  . Financial resource strain: Not on file  . Food insecurity:    Worry: Not on file    Inability: Not on file  . Transportation needs:   Medical: Not on file    Non-medical: Not on file  Tobacco Use  . Smoking status: Never Smoker  . Smokeless tobacco: Never Used  Substance and Sexual Activity  . Alcohol use: No    Alcohol/week: 0.0 standard drinks  . Drug use: No  . Sexual activity: Not on file  Lifestyle  . Physical activity:    Days per week: Not on file    Minutes per session: Not on file  . Stress: Not on file  Relationships  . Social connections:    Talks on phone: Not on file    Gets together: Not on file    Attends religious service: Not on file    Active member of club or organization: Not on file    Attends meetings of clubs or organizations: Not on file    Relationship status: Not on file  Other Topics Concern  . Not on file  Social History Narrative  . Not on file    Outpatient Encounter Medications as of 06/01/2018  Medication Sig  . acetaminophen (TYLENOL) 500 MG tablet Take 1,000 mg by mouth every 6 (  six) hours as needed for mild pain or moderate pain.  Marland Kitchen ALPRAZolam (XANAX) 0.25 MG tablet Take 1 tablet (0.25 mg total) by mouth daily as needed for anxiety.  . Cyanocobalamin (B-12 PO) Take by mouth.  . Fe Fum-FePoly-Vit C-Vit B3 (INTEGRA) 62.5-62.5-40-3 MG CAPS TAKE 1 CAPSULE BY MOUTH ONCE DAILY  . hydrochlorothiazide (MICROZIDE) 12.5 MG capsule TAKE 1 CAPSULE BY MOUTH ONCE DAILY  . levothyroxine (SYNTHROID, LEVOTHROID) 150 MCG tablet TAKE 1 TABLET BY MOUTH ONCE DAILY BEFORE BREAKFAST  . lisinopril (PRINIVIL,ZESTRIL) 10 MG tablet TAKE 1 TABLET BY MOUTH ONCE DAILY  . naproxen (NAPROSYN) 500 MG tablet Take 1 tablet (500 mg total) by mouth 2 (two) times daily as needed.  . rosuvastatin (CRESTOR) 5 MG tablet Take 1 tablet (5 mg total) by mouth daily.  . sertraline (ZOLOFT) 50 MG tablet Take 1 tablet (50 mg total) by mouth daily.  . [DISCONTINUED] Multiple Vitamins-Iron (MULTIVITAMINS WITH IRON) TABS tablet Take 1 tablet by mouth daily.  . [DISCONTINUED] phenazopyridine (PYRIDIUM) 100 MG tablet Take 1  tablet (100 mg total) by mouth 3 (three) times daily as needed for pain.  . [DISCONTINUED] traZODone (DESYREL) 150 MG tablet as needed.   No facility-administered encounter medications on file as of 06/01/2018.     Review of Systems  Constitutional: Negative for appetite change and unexpected weight change.  HENT: Negative for congestion and sinus pressure.   Respiratory: Negative for cough, chest tightness and shortness of breath.   Cardiovascular: Negative for chest pain, palpitations and leg swelling.  Gastrointestinal: Negative for abdominal pain, nausea and vomiting.  Genitourinary: Negative for difficulty urinating and dysuria.  Musculoskeletal: Negative for back pain and myalgias.  Skin: Negative for color change and rash.  Neurological: Negative for dizziness, light-headedness and headaches.  Psychiatric/Behavioral: Negative for agitation.       Increased stress as outlined.         Objective:    Physical Exam  Constitutional: She appears well-developed and well-nourished. No distress.  HENT:  Nose: Nose normal.  Mouth/Throat: Oropharynx is clear and moist.  Neck: Neck supple. No thyromegaly present.  Cardiovascular: Normal rate and regular rhythm.  Pulmonary/Chest: Breath sounds normal. No respiratory distress. She has no wheezes.  Abdominal: Soft. Bowel sounds are normal. There is no tenderness.  Musculoskeletal: She exhibits no edema or tenderness.  Lymphadenopathy:    She has no cervical adenopathy.  Skin: No rash noted. No erythema.  Psychiatric: She has a normal mood and affect. Her behavior is normal.    BP 122/72 (BP Location: Left Arm, Patient Position: Sitting, Cuff Size: Large)   Pulse 90   Temp 98.3 F (36.8 C) (Oral)   Resp 18   Wt 217 lb 12.8 oz (98.8 kg)   SpO2 99%   BMI 34.11 kg/m  Wt Readings from Last 3 Encounters:  06/01/18 217 lb 12.8 oz (98.8 kg)  05/15/18 216 lb 8 oz (98.2 kg)  12/25/17 215 lb 12.8 oz (97.9 kg)     Lab Results    Component Value Date   WBC 6.6 02/24/2018   HGB 12.5 02/24/2018   HCT 38.3 02/24/2018   PLT 271.0 02/24/2018   GLUCOSE 104 (H) 12/24/2017   CHOL 205 (H) 12/24/2017   TRIG 87.0 12/24/2017   HDL 74.60 12/24/2017   LDLDIRECT 178.3 11/11/2013   LDLCALC 113 (H) 12/24/2017   ALT 32 12/24/2017   AST 25 12/24/2017   NA 141 12/24/2017   K 3.9 12/24/2017   CL 104 12/24/2017  CREATININE 0.82 12/24/2017   BUN 13 12/24/2017   CO2 24 12/24/2017   TSH 1.93 12/24/2017    Mm Diag Breast Tomo Uni Left  Result Date: 01/15/2018 CLINICAL DATA:  Screening recall for a left breast asymmetry. EXAM: DIGITAL DIAGNOSTIC UNILATERAL LEFT MAMMOGRAM WITH CAD AND TOMO COMPARISON:  Previous exam(s). ACR Breast Density Category b: There are scattered areas of fibroglandular density. FINDINGS: The asymmetry previously seen in the lower outer quadrant of the left breast has resolved both on spot compression tomosynthesis images as well as a full paddle true lateral tomosynthesis image of the left breast. No suspicious calcifications, masses or areas of distortion are seen in the left breast. Mammographic images were processed with CAD. IMPRESSION: Resolution of the left breast asymmetry consistent with overlapping fibroglandular tissue. RECOMMENDATION: Screening mammogram in one year.(Code:SM-B-01Y) I have discussed the findings and recommendations with the patient. Results were also provided in writing at the conclusion of the visit. If applicable, a reminder letter will be sent to the patient regarding the next appointment. BI-RADS CATEGORY  1: Negative. Electronically Signed   By: Frederico Hamman M.D.   On: 01/15/2018 14:33       Assessment & Plan:   Problem List Items Addressed This Visit    Abnormal liver function test    Have discussed diet and exercise.  Follow liver panel.  On crestor.        Anemia    S/p gastric surgery.  Follow cbc.       Essential hypertension, benign    Blood pressure under  good control.  Continue same medication regimen.  Follow pressures.  Follow metabolic panel.        Hypercholesterolemia    On crestor.  Low cholesterol diet and exercise.  Follow lipid panel and liver function tests.        Hypothyroidism    On thyroid replacement.  Follow tsh.        Major depressive disorder, recurrent episode (HCC)    Has had issues with depression previously.  Previously treated with zoloft.  Did well on the medication.  Feels needs to be on something now.  Discussed treatment options.  After discussion, it was decided to start zoloft.  Follow.        Relevant Medications   sertraline (ZOLOFT) 50 MG tablet   Status post laparoscopic sleeve gastrectomy    Continue diet and exercise.            Dale , MD

## 2018-06-06 ENCOUNTER — Encounter: Payer: Self-pay | Admitting: Internal Medicine

## 2018-06-06 NOTE — Assessment & Plan Note (Signed)
On crestor.  Low cholesterol diet and exercise.  Follow lipid panel and liver function tests.   

## 2018-06-06 NOTE — Assessment & Plan Note (Signed)
Blood pressure under good control.  Continue same medication regimen.  Follow pressures.  Follow metabolic panel.   

## 2018-06-06 NOTE — Assessment & Plan Note (Signed)
Has had issues with depression previously.  Previously treated with zoloft.  Did well on the medication.  Feels needs to be on something now.  Discussed treatment options.  After discussion, it was decided to start zoloft.  Follow.

## 2018-06-06 NOTE — Assessment & Plan Note (Signed)
Continue diet and exercise. 

## 2018-06-06 NOTE — Assessment & Plan Note (Signed)
Have discussed diet and exercise.  Follow liver panel.  On crestor.

## 2018-06-06 NOTE — Assessment & Plan Note (Signed)
S/p gastric surgery.  Follow cbc.

## 2018-06-06 NOTE — Assessment & Plan Note (Signed)
On thyroid replacement.  Follow tsh.  

## 2018-07-14 ENCOUNTER — Ambulatory Visit: Payer: Managed Care, Other (non HMO) | Admitting: Internal Medicine

## 2018-07-14 ENCOUNTER — Encounter: Payer: Self-pay | Admitting: Internal Medicine

## 2018-07-14 VITALS — BP 122/72 | HR 72 | Temp 98.8°F | Resp 16 | Ht 67.0 in | Wt 229.0 lb

## 2018-07-14 DIAGNOSIS — D649 Anemia, unspecified: Secondary | ICD-10-CM | POA: Diagnosis not present

## 2018-07-14 DIAGNOSIS — E039 Hypothyroidism, unspecified: Secondary | ICD-10-CM

## 2018-07-14 DIAGNOSIS — E7800 Pure hypercholesterolemia, unspecified: Secondary | ICD-10-CM

## 2018-07-14 DIAGNOSIS — Z23 Encounter for immunization: Secondary | ICD-10-CM | POA: Diagnosis not present

## 2018-07-14 DIAGNOSIS — R945 Abnormal results of liver function studies: Secondary | ICD-10-CM

## 2018-07-14 DIAGNOSIS — H9202 Otalgia, left ear: Secondary | ICD-10-CM | POA: Diagnosis not present

## 2018-07-14 DIAGNOSIS — R7989 Other specified abnormal findings of blood chemistry: Secondary | ICD-10-CM

## 2018-07-14 DIAGNOSIS — I10 Essential (primary) hypertension: Secondary | ICD-10-CM

## 2018-07-14 DIAGNOSIS — F330 Major depressive disorder, recurrent, mild: Secondary | ICD-10-CM

## 2018-07-14 MED ORDER — SERTRALINE HCL 50 MG PO TABS: 50.0000 mg | ORAL_TABLET | Freq: Every day | ORAL | 2 refills | Status: DC

## 2018-07-14 NOTE — Progress Notes (Signed)
Patient ID: [name] Nielsen, female   DOB: 05-22-1976, 42 y.o.   MRN: [id number]   Subjective:    Patient ID: [name] Nielsen, female    DOB: 1976/06/14, 42 y.o.   MRN: [id number]  HPI  Patient here for a scheduled follow up. Has been having increased stress recently.  Was started on zoloft last visit.  Doing well on this medication.  Feels better.  Feels more like herself.  Increased weight.  Plans to start exercising more.  Discussed diet and exercise.  No chest pain.  No sob. No acid reflux.  No abdominal pain.  Bowels moving.  She has noticed a tingling sensation in her left ear. Notices more when lies down.  Intermittent.  No change in hearing.  No headache.  Discussed TMJ.     Past Medical History:  Diagnosis Date  . Anemia    hx of   . Anxiety   . Depression   . Frequent headaches    hx of  . Heart murmur   . Hx: UTI (urinary tract infection)    child, s/p urinary procecure  . Hypercholesterolemia   . Hypertension   . Hypothyroidism    Past Surgical History:  Procedure Laterality Date  . CHOLECYSTECTOMY  2012  . LAPAROSCOPIC GASTRIC SLEEVE RESECTION N/A 07/05/2014   Procedure: LAPAROSCOPIC GASTRIC SLEEVE RESECTION with upper endoscopy;  Surgeon: Wenda Low, MD;  Location: [facility];  Service: General;  Laterality: N/A;  . TUBAL LIGATION  1999   Family History  Problem Relation Age of Onset  . Cancer Mother        brain  . Diabetes Father   . Hypertension Father   . Kidney disease Father   . Stroke Father   . Cancer Father        prostate  . Breast cancer Neg Hx    Social History   Socioeconomic History  . Marital status: Single    Spouse name: Not on file  . Number of children: Not on file  . Years of education: Not on file  . Highest education level: Not on file  Occupational History  . Not on file  Social Needs  . Financial resource strain: Not on file  . Food insecurity:    Worry: Not on file    Inability: Not on file  . Transportation needs:   Medical: Not on file    Non-medical: Not on file  Tobacco Use  . Smoking status: Never Smoker  . Smokeless tobacco: Never Used  Substance and Sexual Activity  . Alcohol use: No    Alcohol/week: 0.0 standard drinks  . Drug use: No  . Sexual activity: Not on file  Lifestyle  . Physical activity:    Days per week: Not on file    Minutes per session: Not on file  . Stress: Not on file  Relationships  . Social connections:    Talks on phone: Not on file    Gets together: Not on file    Attends religious service: Not on file    Active member of club or organization: Not on file    Attends meetings of clubs or organizations: Not on file    Relationship status: Not on file  Other Topics Concern  . Not on file  Social History Narrative  . Not on file    Outpatient Encounter Medications as of 07/14/2018  Medication Sig  . acetaminophen (TYLENOL) 500 MG tablet Take 1,000 mg by mouth every 6 (six) hours as  needed for mild pain or moderate pain.  Marland Kitchen ALPRAZolam (XANAX) 0.25 MG tablet Take 1 tablet (0.25 mg total) by mouth daily as needed for anxiety.  . Cyanocobalamin (B-12 PO) Take by mouth.  . Fe Fum-FePoly-Vit C-Vit B3 (INTEGRA) 62.5-62.5-40-3 MG CAPS TAKE 1 CAPSULE BY MOUTH ONCE DAILY  . hydrochlorothiazide (MICROZIDE) 12.5 MG capsule TAKE 1 CAPSULE BY MOUTH ONCE DAILY  . levothyroxine (SYNTHROID, LEVOTHROID) 150 MCG tablet TAKE 1 TABLET BY MOUTH ONCE DAILY BEFORE BREAKFAST  . lisinopril (PRINIVIL,ZESTRIL) 10 MG tablet TAKE 1 TABLET BY MOUTH ONCE DAILY  . rosuvastatin (CRESTOR) 5 MG tablet Take 1 tablet (5 mg total) by mouth daily.  . sertraline (ZOLOFT) 50 MG tablet Take 1 tablet (50 mg total) by mouth daily.  . [DISCONTINUED] sertraline (ZOLOFT) 50 MG tablet Take 1 tablet (50 mg total) by mouth daily.  . [DISCONTINUED] naproxen (NAPROSYN) 500 MG tablet Take 1 tablet (500 mg total) by mouth 2 (two) times daily as needed. (Patient not taking: Reported on 07/14/2018)   No  facility-administered encounter medications on file as of 07/14/2018.     Review of Systems  Constitutional: Negative for appetite change and unexpected weight change.  HENT: Negative for congestion and sinus pressure.        Left ear irritation/tingling.  Intermittent.    Respiratory: Negative for cough, chest tightness and shortness of breath.   Cardiovascular: Negative for chest pain, palpitations and leg swelling.  Gastrointestinal: Negative for abdominal pain, diarrhea, nausea and vomiting.  Genitourinary: Negative for difficulty urinating and dysuria.  Musculoskeletal: Negative for joint swelling and myalgias.  Skin: Negative for color change and rash.  Neurological: Negative for dizziness, light-headedness and headaches.  Psychiatric/Behavioral: Negative for agitation and dysphoric mood.       Objective:    Physical Exam  Constitutional: She appears well-developed and well-nourished. No distress.  HENT:  Nose: Nose normal.  Mouth/Throat: Oropharynx is clear and moist.  EARS - TM without erythema.  No erythema or irritation in the canal.  No pain TMJ.  No rash.   Neck: Neck supple. No thyromegaly present.  Cardiovascular: Normal rate and regular rhythm.  Pulmonary/Chest: Breath sounds normal. No respiratory distress. She has no wheezes.  Abdominal: Soft. Bowel sounds are normal. There is no tenderness.  Musculoskeletal: She exhibits no edema or tenderness.  Lymphadenopathy:    She has no cervical adenopathy.  Skin: No rash noted. No erythema.  Psychiatric: She has a normal mood and affect. Her behavior is normal.    BP 122/72 (BP Location: Left Arm, Patient Position: Sitting, Cuff Size: Large)   Pulse 72   Temp 98.8 F (37.1 C) (Oral)   Resp 16   Ht 5\' 7"  (1.702 m)   Wt 229 lb (103.9 kg)   SpO2 97%   BMI 35.87 kg/m  Wt Readings from Last 3 Encounters:  07/14/18 229 lb (103.9 kg)  06/01/18 217 lb 12.8 oz (98.8 kg)  05/15/18 216 lb 8 oz (98.2 kg)     Lab  Results  Component Value Date   WBC 6.6 02/24/2018   HGB 12.5 02/24/2018   HCT 38.3 02/24/2018   PLT 271.0 02/24/2018   GLUCOSE 104 (H) 12/24/2017   CHOL 205 (H) 12/24/2017   TRIG 87.0 12/24/2017   HDL 74.60 12/24/2017   LDLDIRECT 178.3 11/11/2013   LDLCALC 113 (H) 12/24/2017   ALT 32 12/24/2017   AST 25 12/24/2017   NA 141 12/24/2017   K 3.9 12/24/2017   CL 104 12/24/2017  CREATININE 0.82 12/24/2017   BUN 13 12/24/2017   CO2 24 12/24/2017   TSH 1.93 12/24/2017    Mm Diag Breast Tomo Uni Left  Result Date: 01/15/2018 CLINICAL DATA:  Screening recall for a left breast asymmetry. EXAM: DIGITAL DIAGNOSTIC UNILATERAL LEFT MAMMOGRAM WITH CAD AND TOMO COMPARISON:  Previous exam(s). ACR Breast Density Category b: There are scattered areas of fibroglandular density. FINDINGS: The asymmetry previously seen in the lower outer quadrant of the left breast has resolved both on spot compression tomosynthesis images as well as a full paddle true lateral tomosynthesis image of the left breast. No suspicious calcifications, masses or areas of distortion are seen in the left breast. Mammographic images were processed with CAD. IMPRESSION: Resolution of the left breast asymmetry consistent with overlapping fibroglandular tissue. RECOMMENDATION: Screening mammogram in one year.(Code:SM-B-01Y) I have discussed the findings and recommendations with the patient. Results were also provided in writing at the conclusion of the visit. If applicable, a reminder letter will be sent to the patient regarding the next appointment. BI-RADS CATEGORY  1: Negative. Electronically Signed   By: Frederico Hamman M.D.   On: 01/15/2018 14:33       Assessment & Plan:   Problem List Items Addressed This Visit    Abnormal liver function test    Diet and exercise.  Follow liver panel.       Relevant Orders   Hepatic function panel   Anemia    Follow cbc.       Relevant Orders   CBC with Differential/Platelet    Ferritin   Essential hypertension, benign    Blood pressure under good control.  Continue same medication regimen.  Follow pressures.  Follow metabolic panel.        Relevant Orders   Basic metabolic panel   Hypercholesterolemia    On crestor.  Low cholesterol diet and exercise.  Follow lipid panel and liver function tests.       Relevant Orders   Lipid panel   Hypothyroidism    On thyroid replacement.  Follow tsh.       Major depressive disorder, recurrent episode (HCC)    On zoloft and doing well.  Feels better.  Follow.       Relevant Medications   sertraline (ZOLOFT) 50 MG tablet    Other Visit Diagnoses    Ear pain, left    -  Primary   Left ear irritation/tingling.  No abnormality noted on exam.  Persistent intermittent issue for her.  Request ENT evaluation.    Relevant Orders   Ambulatory referral to ENT   Need for immunization against influenza       Relevant Orders   Flu Vaccine QUAD 36+ mos IM (Completed)       Dale [place], MD

## 2018-07-19 ENCOUNTER — Encounter: Payer: Self-pay | Admitting: Internal Medicine

## 2018-07-19 NOTE — Assessment & Plan Note (Signed)
Diet and exercise.  Follow liver panel.   

## 2018-07-19 NOTE — Assessment & Plan Note (Signed)
On crestor.  Low cholesterol diet and exercise.  Follow lipid panel and liver function tests.   

## 2018-07-19 NOTE — Assessment & Plan Note (Signed)
Blood pressure under good control.  Continue same medication regimen.  Follow pressures.  Follow metabolic panel.   

## 2018-07-19 NOTE — Assessment & Plan Note (Signed)
On thyroid replacement.  Follow tsh.  

## 2018-07-19 NOTE — Assessment & Plan Note (Signed)
Follow cbc.  

## 2018-07-19 NOTE — Assessment & Plan Note (Signed)
On zoloft and doing well.  Feels better.  Follow.   

## 2018-07-24 ENCOUNTER — Other Ambulatory Visit (INDEPENDENT_AMBULATORY_CARE_PROVIDER_SITE_OTHER): Payer: Managed Care, Other (non HMO)

## 2018-07-24 DIAGNOSIS — I10 Essential (primary) hypertension: Secondary | ICD-10-CM

## 2018-07-24 NOTE — Addendum Note (Signed)
Addended by: Penne Lash on: 07/24/2018 08:19 AM   Modules accepted: Orders

## 2018-07-24 NOTE — Addendum Note (Signed)
Addended by: Penne Lash on: 07/24/2018 08:12 AM   Modules accepted: Orders

## 2018-07-25 LAB — CBC WITH DIFFERENTIAL/PLATELET

## 2018-08-03 ENCOUNTER — Other Ambulatory Visit (INDEPENDENT_AMBULATORY_CARE_PROVIDER_SITE_OTHER): Payer: Managed Care, Other (non HMO)

## 2018-08-03 DIAGNOSIS — R7989 Other specified abnormal findings of blood chemistry: Secondary | ICD-10-CM

## 2018-08-03 DIAGNOSIS — E7800 Pure hypercholesterolemia, unspecified: Secondary | ICD-10-CM

## 2018-08-03 DIAGNOSIS — D649 Anemia, unspecified: Secondary | ICD-10-CM

## 2018-08-03 DIAGNOSIS — R945 Abnormal results of liver function studies: Secondary | ICD-10-CM

## 2018-08-03 DIAGNOSIS — I10 Essential (primary) hypertension: Secondary | ICD-10-CM

## 2018-08-04 LAB — BASIC METABOLIC PANEL
BUN / CREAT RATIO: 14 (ref 9–23)
BUN: 13 mg/dL (ref 6–24)
CO2: 22 mmol/L (ref 20–29)
CREATININE: 0.96 mg/dL (ref 0.57–1.00)
Calcium: 9.1 mg/dL (ref 8.7–10.2)
Chloride: 104 mmol/L (ref 96–106)
GFR, EST AFRICAN AMERICAN: 83 mL/min/{1.73_m2} (ref >59)
GFR, EST NON AFRICAN AMERICAN: 72 mL/min/{1.73_m2} (ref >59)
GLUCOSE: 90 mg/dL (ref 65–99)
Potassium: 4.1 mmol/L (ref 3.5–5.2)
SODIUM: 142 mmol/L (ref 134–144)

## 2018-08-04 LAB — HEPATIC FUNCTION PANEL
ALK PHOS: 34 IU/L — AB (ref 39–117)
ALT: 68 IU/L — ABNORMAL HIGH (ref 0–32)
AST: 35 IU/L (ref 0–40)
Albumin: 4.2 g/dL (ref 3.5–5.5)
BILIRUBIN, DIRECT: 0.06 mg/dL (ref 0.00–0.40)
TOTAL PROTEIN: 6.7 g/dL (ref 6.0–8.5)

## 2018-08-04 LAB — LIPID PANEL
CHOLESTEROL TOTAL: 206 mg/dL — AB (ref 100–199)
Chol/HDL Ratio: 2.6 ratio (ref 0.0–4.4)
HDL: 78 mg/dL (ref >39)
LDL Calculated: 113 mg/dL — ABNORMAL HIGH (ref 0–99)
TRIGLYCERIDES: 75 mg/dL (ref 0–149)
VLDL CHOLESTEROL CAL: 15 mg/dL (ref 5–40)

## 2018-08-04 LAB — FERRITIN: Ferritin: 70 ng/mL (ref 15–150)

## 2018-08-06 ENCOUNTER — Other Ambulatory Visit: Payer: Self-pay | Admitting: Internal Medicine

## 2018-08-06 DIAGNOSIS — R7989 Other specified abnormal findings of blood chemistry: Secondary | ICD-10-CM

## 2018-08-06 DIAGNOSIS — D649 Anemia, unspecified: Secondary | ICD-10-CM

## 2018-08-06 DIAGNOSIS — R945 Abnormal results of liver function studies: Secondary | ICD-10-CM

## 2018-08-06 NOTE — Progress Notes (Signed)
Order placed for f/u labs.  

## 2018-08-19 ENCOUNTER — Telehealth: Payer: Self-pay | Admitting: Radiology

## 2018-08-19 ENCOUNTER — Other Ambulatory Visit (INDEPENDENT_AMBULATORY_CARE_PROVIDER_SITE_OTHER): Payer: Managed Care, Other (non HMO)

## 2018-08-19 DIAGNOSIS — R7989 Other specified abnormal findings of blood chemistry: Secondary | ICD-10-CM

## 2018-08-19 DIAGNOSIS — R945 Abnormal results of liver function studies: Secondary | ICD-10-CM | POA: Diagnosis not present

## 2018-08-19 DIAGNOSIS — D649 Anemia, unspecified: Secondary | ICD-10-CM | POA: Diagnosis not present

## 2018-08-19 LAB — HEPATIC FUNCTION PANEL
ALT: 33 U/L (ref 0–35)
AST: 20 U/L (ref 0–37)
Albumin: 4.5 g/dL (ref 3.5–5.2)
Alkaline Phosphatase: 30 U/L — ABNORMAL LOW (ref 39–117)
BILIRUBIN DIRECT: 0 mg/dL (ref 0.0–0.3)
BILIRUBIN TOTAL: 0.3 mg/dL (ref 0.2–1.2)
Total Protein: 7.6 g/dL (ref 6.0–8.3)

## 2018-08-19 LAB — IBC PANEL
IRON: 67 ug/dL (ref 42–145)
Saturation Ratios: 18.8 % — ABNORMAL LOW (ref 20.0–50.0)
TRANSFERRIN: 255 mg/dL (ref 212.0–360.0)

## 2018-08-19 LAB — FERRITIN: FERRITIN: 50.1 ng/mL (ref 10.0–291.0)

## 2018-08-19 NOTE — Telephone Encounter (Signed)
The last time she was here they were not able to result her cbc.  I do need the cbc, if she can return.  Thanks

## 2018-08-19 NOTE — Telephone Encounter (Signed)
[facility] called and stated CBC was unable to be ran. Would you like pt to return for CBC draw?

## 2018-08-20 NOTE — Telephone Encounter (Signed)
Please call pt and schedule lab appt to re draw CBC

## 2018-08-21 ENCOUNTER — Other Ambulatory Visit: Payer: Self-pay

## 2018-08-21 DIAGNOSIS — D649 Anemia, unspecified: Secondary | ICD-10-CM

## 2018-08-21 NOTE — Telephone Encounter (Signed)
Pt scheduled and lab orders placed

## 2018-08-22 ENCOUNTER — Other Ambulatory Visit: Payer: Self-pay | Admitting: Internal Medicine

## 2018-08-24 ENCOUNTER — Other Ambulatory Visit: Payer: Managed Care, Other (non HMO)

## 2018-08-25 ENCOUNTER — Other Ambulatory Visit (INDEPENDENT_AMBULATORY_CARE_PROVIDER_SITE_OTHER): Payer: Managed Care, Other (non HMO)

## 2018-08-25 DIAGNOSIS — D649 Anemia, unspecified: Secondary | ICD-10-CM | POA: Diagnosis not present

## 2018-08-25 NOTE — Addendum Note (Signed)
Addended by: Penne LashWIGGINS, Zebediah Beezley N on: 08/25/2018 11:19 AM   Modules accepted: Orders

## 2018-08-26 ENCOUNTER — Encounter: Payer: Self-pay | Admitting: Internal Medicine

## 2018-08-26 LAB — CBC WITH DIFFERENTIAL/PLATELET
BASOS ABS: 0.1 10*3/uL (ref 0.0–0.2)
Basos: 1 %
EOS (ABSOLUTE): 0.2 10*3/uL (ref 0.0–0.4)
Eos: 3 %
Hematocrit: 37.6 % (ref 34.0–46.6)
Hemoglobin: 12.9 g/dL (ref 11.1–15.9)
IMMATURE GRANS (ABS): 0.1 10*3/uL (ref 0.0–0.1)
Immature Granulocytes: 1 %
LYMPHS: 28 %
Lymphocytes Absolute: 2.1 10*3/uL (ref 0.7–3.1)
MCH: 26.5 pg — AB (ref 26.6–33.0)
MCHC: 34.3 g/dL (ref 31.5–35.7)
MCV: 77 fL — ABNORMAL LOW (ref 79–97)
Monocytes Absolute: 0.6 10*3/uL (ref 0.1–0.9)
Monocytes: 8 %
NEUTROS ABS: 4.7 10*3/uL (ref 1.4–7.0)
Neutrophils: 59 %
PLATELETS: 269 10*3/uL (ref 150–450)
RBC: 4.87 x10E6/uL (ref 3.77–5.28)
RDW: 13.5 % (ref 12.3–15.4)
WBC: 7.8 10*3/uL (ref 3.4–10.8)

## 2018-09-07 ENCOUNTER — Other Ambulatory Visit: Payer: Self-pay | Admitting: Internal Medicine

## 2018-09-16 ENCOUNTER — Other Ambulatory Visit: Payer: Self-pay

## 2018-09-16 ENCOUNTER — Encounter: Payer: Self-pay | Admitting: Internal Medicine

## 2018-09-16 MED ORDER — LEVOTHYROXINE SODIUM 150 MCG PO TABS: ORAL_TABLET | ORAL | 1 refills | Status: DC

## 2018-10-22 ENCOUNTER — Other Ambulatory Visit: Payer: Self-pay | Admitting: Internal Medicine

## 2018-10-29 ENCOUNTER — Encounter: Payer: Self-pay | Admitting: Internal Medicine

## 2018-10-29 ENCOUNTER — Ambulatory Visit: Payer: Managed Care, Other (non HMO) | Admitting: Internal Medicine

## 2018-10-29 VITALS — BP 128/72 | HR 84 | Temp 99.1°F | Resp 16 | Wt 249.4 lb

## 2018-10-29 DIAGNOSIS — R945 Abnormal results of liver function studies: Secondary | ICD-10-CM

## 2018-10-29 DIAGNOSIS — F439 Reaction to severe stress, unspecified: Secondary | ICD-10-CM

## 2018-10-29 DIAGNOSIS — R41840 Attention and concentration deficit: Secondary | ICD-10-CM

## 2018-10-29 DIAGNOSIS — R635 Abnormal weight gain: Secondary | ICD-10-CM | POA: Diagnosis not present

## 2018-10-29 DIAGNOSIS — E039 Hypothyroidism, unspecified: Secondary | ICD-10-CM

## 2018-10-29 DIAGNOSIS — R7989 Other specified abnormal findings of blood chemistry: Secondary | ICD-10-CM

## 2018-10-29 DIAGNOSIS — F32A Depression, unspecified: Secondary | ICD-10-CM

## 2018-10-29 DIAGNOSIS — I10 Essential (primary) hypertension: Secondary | ICD-10-CM

## 2018-10-29 DIAGNOSIS — F320 Major depressive disorder, single episode, mild: Secondary | ICD-10-CM

## 2018-10-29 DIAGNOSIS — N920 Excessive and frequent menstruation with regular cycle: Secondary | ICD-10-CM

## 2018-10-29 DIAGNOSIS — E7800 Pure hypercholesterolemia, unspecified: Secondary | ICD-10-CM

## 2018-10-29 DIAGNOSIS — D649 Anemia, unspecified: Secondary | ICD-10-CM

## 2018-10-29 NOTE — Progress Notes (Signed)
Patient ID: [name] Nielsen, female   DOB: 24-Jul-1976, 43 y.o.   MRN: [id number]   Subjective:    Patient ID: [name] Nielsen, female    DOB: 24-Jul-1976, 43 y.o.   MRN: [id number]  HPI  Patient here for a scheduled follow up.  She reports she is doing relatively well.  Has gained weight.  Previously saw Dr Maryruth BunKapur and diagnosed with a binge eating disorder.  Was placed on vyvanse.  On zoloft now for stress,etc.  Doing well on zoloft.  Feels helps.  Was concerned the zoloft may be contributing to her weight gain.  She has recently started back exercising.  Getting back in the routine.  Discussed diet and exercise.  No chest pain.  No sob.  No acid reflux.  No abdominal pain.  Bowels moving.  She also reports periods are heavier and longer.  Some hot flashes.  Problems in the past with iron deficiency.     Past Medical History:  Diagnosis Date  . Anemia    hx of   . Anxiety   . Depression   . Frequent headaches    hx of  . Heart murmur   . Hx: UTI (urinary tract infection)    child, s/p urinary procecure  . Hypercholesterolemia   . Hypertension   . Hypothyroidism    Past Surgical History:  Procedure Laterality Date  . CHOLECYSTECTOMY  2012  . LAPAROSCOPIC GASTRIC SLEEVE RESECTION N/A 07/05/2014   Procedure: LAPAROSCOPIC GASTRIC SLEEVE RESECTION with upper endoscopy;  Surgeon: Wenda LowMatt Martin, MD;  Location: [facility];  Service: General;  Laterality: N/A;  . TUBAL LIGATION  1999   Family History  Problem Relation Age of Onset  . Cancer Mother        brain  . Diabetes Father   . Hypertension Father   . Kidney disease Father   . Stroke Father   . Cancer Father        prostate  . Breast cancer Neg Hx    Social History   Socioeconomic History  . Marital status: Single    Spouse name: Not on file  . Number of children: Not on file  . Years of education: Not on file  . Highest education level: Not on file  Occupational History  . Not on file  Social Needs  . Financial resource  strain: Not on file  . Food insecurity:    Worry: Not on file    Inability: Not on file  . Transportation needs:    Medical: Not on file    Non-medical: Not on file  Tobacco Use  . Smoking status: Never Smoker  . Smokeless tobacco: Never Used  Substance and Sexual Activity  . Alcohol use: No    Alcohol/week: 0.0 standard drinks  . Drug use: No  . Sexual activity: Not on file  Lifestyle  . Physical activity:    Days per week: Not on file    Minutes per session: Not on file  . Stress: Not on file  Relationships  . Social connections:    Talks on phone: Not on file    Gets together: Not on file    Attends religious service: Not on file    Active member of club or organization: Not on file    Attends meetings of clubs or organizations: Not on file    Relationship status: Not on file  Other Topics Concern  . Not on file  Social History Narrative  . Not on file  Outpatient Encounter Medications as of 10/29/2018  Medication Sig  . acetaminophen (TYLENOL) 500 MG tablet Take 1,000 mg by mouth every 6 (six) hours as needed for mild pain or moderate pain.  Marland Kitchen. ALPRAZolam (XANAX) 0.25 MG tablet Take 1 tablet (0.25 mg total) by mouth daily as needed for anxiety.  . Cyanocobalamin (B-12 PO) Take by mouth.  . Fe Fum-FePoly-Vit C-Vit B3 (INTEGRA) 62.5-62.5-40-3 MG CAPS TAKE 1 CAPSULE BY MOUTH ONCE DAILY  . hydrochlorothiazide (MICROZIDE) 12.5 MG capsule TAKE 1 CAPSULE BY MOUTH ONCE DAILY  . levothyroxine (SYNTHROID, LEVOTHROID) 150 MCG tablet TAKE 1 TABLET BY MOUTH ONCE DAILY BEFORE BREAKFAST  . lisinopril (PRINIVIL,ZESTRIL) 10 MG tablet TAKE 1 TABLET BY MOUTH ONCE DAILY  . rosuvastatin (CRESTOR) 5 MG tablet TAKE 1 TABLET BY MOUTH ONCE DAILY  . sertraline (ZOLOFT) 50 MG tablet Take 1 tablet (50 mg total) by mouth daily.   No facility-administered encounter medications on file as of 10/29/2018.     Review of Systems  Constitutional: Negative for unexpected weight change.        Increased appetite.  Gaining weight.   HENT: Negative for congestion and sinus pressure.   Respiratory: Negative for cough, chest tightness and shortness of breath.   Cardiovascular: Negative for chest pain, palpitations and leg swelling.  Gastrointestinal: Negative for abdominal pain, diarrhea, nausea and vomiting.  Genitourinary: Negative for difficulty urinating and dysuria.       Heavy periods.    Musculoskeletal: Negative for joint swelling and myalgias.  Skin: Negative for color change and rash.  Neurological: Negative for dizziness, light-headedness and headaches.  Psychiatric/Behavioral: Negative for agitation and dysphoric mood.       Objective:    Physical Exam Constitutional:      General: She is not in acute distress.    Appearance: Normal appearance.  HENT:     Nose: Nose normal. No congestion.     Mouth/Throat:     Pharynx: No oropharyngeal exudate or posterior oropharyngeal erythema.  Neck:     Musculoskeletal: Neck supple. No muscular tenderness.     Thyroid: No thyromegaly.  Cardiovascular:     Rate and Rhythm: Normal rate and regular rhythm.  Pulmonary:     Effort: No respiratory distress.     Breath sounds: Normal breath sounds. No wheezing.  Abdominal:     General: Bowel sounds are normal.     Palpations: Abdomen is soft.     Tenderness: There is no abdominal tenderness.  Musculoskeletal:        General: No swelling or tenderness.  Lymphadenopathy:     Cervical: No cervical adenopathy.  Skin:    Findings: No erythema or rash.  Neurological:     Mental Status: She is alert.  Psychiatric:        Mood and Affect: Mood normal.        Behavior: Behavior normal.     BP 128/72 (BP Location: Left Arm, Patient Position: Sitting, Cuff Size: Normal)   Pulse 84   Temp 99.1 F (37.3 C) (Oral)   Resp 16   Wt 249 lb 6.4 oz (113.1 kg)   SpO2 98%   BMI 39.06 kg/m  Wt Readings from Last 3 Encounters:  10/29/18 249 lb 6.4 oz (113.1 kg)  07/14/18 229 lb  (103.9 kg)  06/01/18 217 lb 12.8 oz (98.8 kg)     Lab Results  Component Value Date   WBC 7.8 08/25/2018   HGB 12.9 08/25/2018   HCT 37.6 08/25/2018  PLT 269 08/25/2018   GLUCOSE 90 08/03/2018   CHOL 206 (H) 08/03/2018   TRIG 75 08/03/2018   HDL 78 08/03/2018   LDLDIRECT 178.3 11/11/2013   LDLCALC 113 (H) 08/03/2018   ALT 33 08/19/2018   AST 20 08/19/2018   NA 142 08/03/2018   K 4.1 08/03/2018   CL 104 08/03/2018   CREATININE 0.96 08/03/2018   BUN 13 08/03/2018   CO2 22 08/03/2018   TSH 1.93 12/24/2017    Mm Diag Breast Tomo Uni Left  Result Date: 01/15/2018 CLINICAL DATA:  Screening recall for a left breast asymmetry. EXAM: DIGITAL DIAGNOSTIC UNILATERAL LEFT MAMMOGRAM WITH CAD AND TOMO COMPARISON:  Previous exam(s). ACR Breast Density Category b: There are scattered areas of fibroglandular density. FINDINGS: The asymmetry previously seen in the lower outer quadrant of the left breast has resolved both on spot compression tomosynthesis images as well as a full paddle true lateral tomosynthesis image of the left breast. No suspicious calcifications, masses or areas of distortion are seen in the left breast. Mammographic images were processed with CAD. IMPRESSION: Resolution of the left breast asymmetry consistent with overlapping fibroglandular tissue. RECOMMENDATION: Screening mammogram in one year.(Code:SM-B-01Y) I have discussed the findings and recommendations with the patient. Results were also provided in writing at the conclusion of the visit. If applicable, a reminder letter will be sent to the patient regarding the next appointment. BI-RADS CATEGORY  1: Negative. Electronically Signed   By: Frederico Hamman M.D.   On: 01/15/2018 14:33       Assessment & Plan:   Problem List Items Addressed This Visit    Abnormal liver function test    Diet and exercise.  Follow liver panel.        Anemia    Has a history of iron deficient anemia.  Follow cbc/ferritin.  Being  referred to gyn for evaluation for heavy periods.        Essential hypertension, benign    Blood pressure under good control.  Continue same medication regimen.  Follow pressures.  Follow metabolic panel.        Heavy menses    Heavy periods as outlined.  Periods lasting 10 days.  Getting heavier.  Will refer to gyn for evaluation.        Relevant Orders   Ambulatory referral to Gynecology   Hypercholesterolemia    On crestor.  Low cholesterol diet and exercise.  Follow lipid panel and liver function tests.         Hypothyroidism    On thyroid replacement.  Follow tsh.       Mild depression (HCC)    On zoloft and doing well.  Feels better on the medication.  Binge eating.  Has been diagnosed with a binge eating disorder previously (per pt).  Request referral to psychiatry in [place] for further evaluation and treatment.        Poor concentration - Primary    Decrease in concentration and focus.  Will have psychiatry evaluate.        Relevant Orders   Ambulatory referral to Psychiatry    Other Visit Diagnoses    Stress       Relevant Orders   Ambulatory referral to Psychiatry   Weight gain       Relevant Orders   Ambulatory referral to Psychiatry       Dale [place], MD

## 2018-11-01 ENCOUNTER — Encounter: Payer: Self-pay | Admitting: Internal Medicine

## 2018-11-01 NOTE — Assessment & Plan Note (Signed)
Diet and exercise.  Follow liver panel.   

## 2018-11-01 NOTE — Assessment & Plan Note (Signed)
On thyroid replacement.  Follow tsh.  

## 2018-11-01 NOTE — Assessment & Plan Note (Signed)
On crestor.  Low cholesterol diet and exercise.  Follow lipid panel and liver function tests.   

## 2018-11-01 NOTE — Assessment & Plan Note (Signed)
Decrease in concentration and focus.  Will have psychiatry evaluate.

## 2018-11-01 NOTE — Assessment & Plan Note (Signed)
Blood pressure under good control.  Continue same medication regimen.  Follow pressures.  Follow metabolic panel.   

## 2018-11-01 NOTE — Assessment & Plan Note (Signed)
On zoloft and doing well.  Feels better on the medication.  Binge eating.  Has been diagnosed with a binge eating disorder previously (per pt).  Request referral to psychiatry in [place] for further evaluation and treatment.

## 2018-11-01 NOTE — Assessment & Plan Note (Signed)
Heavy periods as outlined.  Periods lasting 10 days.  Getting heavier.  Will refer to gyn for evaluation.

## 2018-11-01 NOTE — Assessment & Plan Note (Signed)
Has a history of iron deficient anemia.  Follow cbc/ferritin.  Being referred to gyn for evaluation for heavy periods.

## 2018-11-09 ENCOUNTER — Encounter: Payer: Self-pay | Admitting: Internal Medicine

## 2018-11-09 NOTE — Telephone Encounter (Signed)
Melissa, can you help with this?  Anyway to get an earlier appt with Dr Evelene Croon.  Also, let me know if I need to do anything for gyn appt.  Thanks.

## 2018-11-09 NOTE — Telephone Encounter (Signed)
Both referrals have been submitted. Is there another psychiatrist that we can refer to?

## 2018-11-13 NOTE — Telephone Encounter (Signed)
Unfortunately Dr. Evelene Croon can not see her any earlier. I can try sending her to Dr. Maryruth Bun?  The GYN referral has been sent to Dr. Cherly Hensen office. They are reviewing the referral and will contact her to schedule the appointment. Thx, melissa

## 2018-11-23 IMAGING — MG MM DIGITAL SCREENING BILAT W/ TOMO W/ CAD
8 of 12 series · 8 of 28 positions shown · non-contrast
Comparison: Previous exam(s).

CLINICAL DATA: Screening.

EXAM:
DIGITAL SCREENING BILATERAL MAMMOGRAM WITH TOMO AND CAD

[R CC]
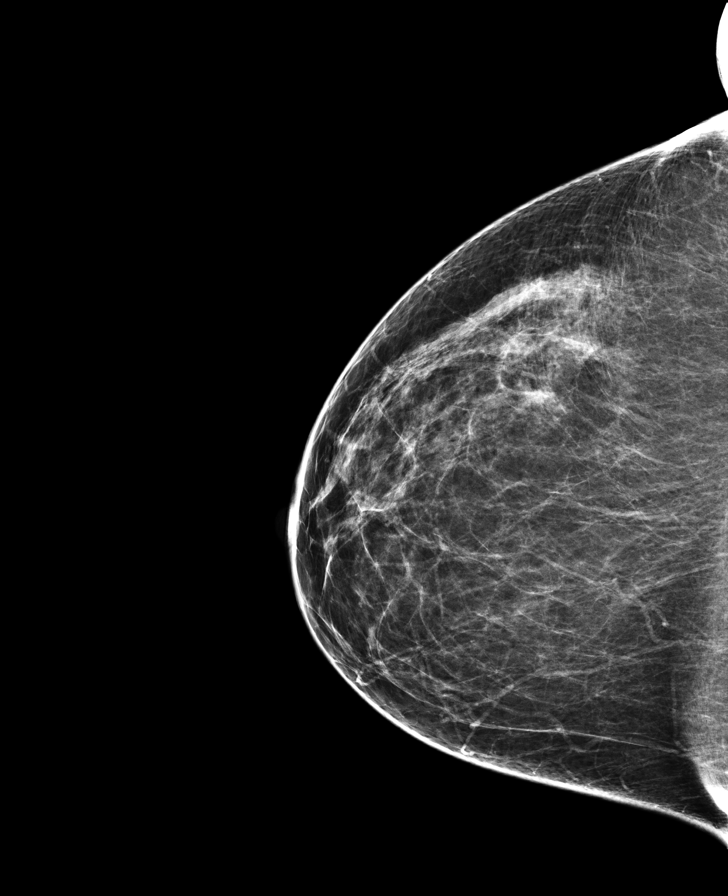

[L MLO synth-2D]
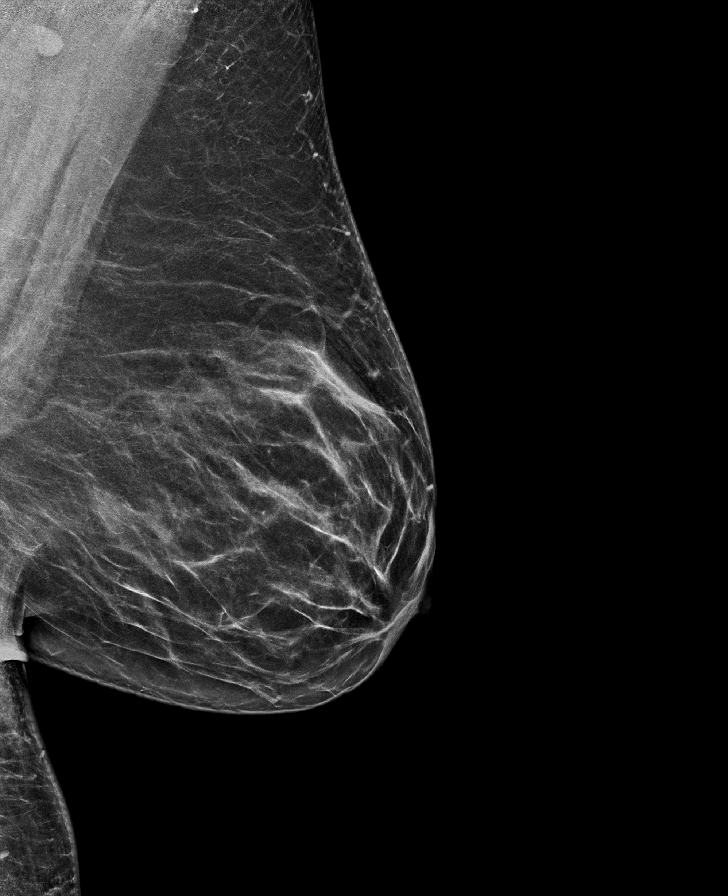

[L MLO]
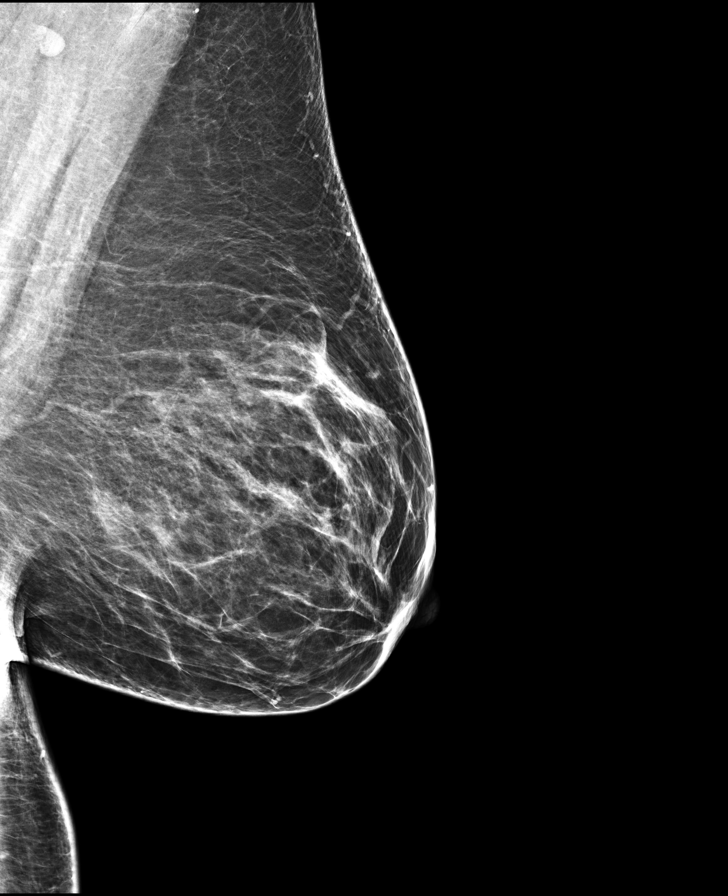

[L CC synth-2D]
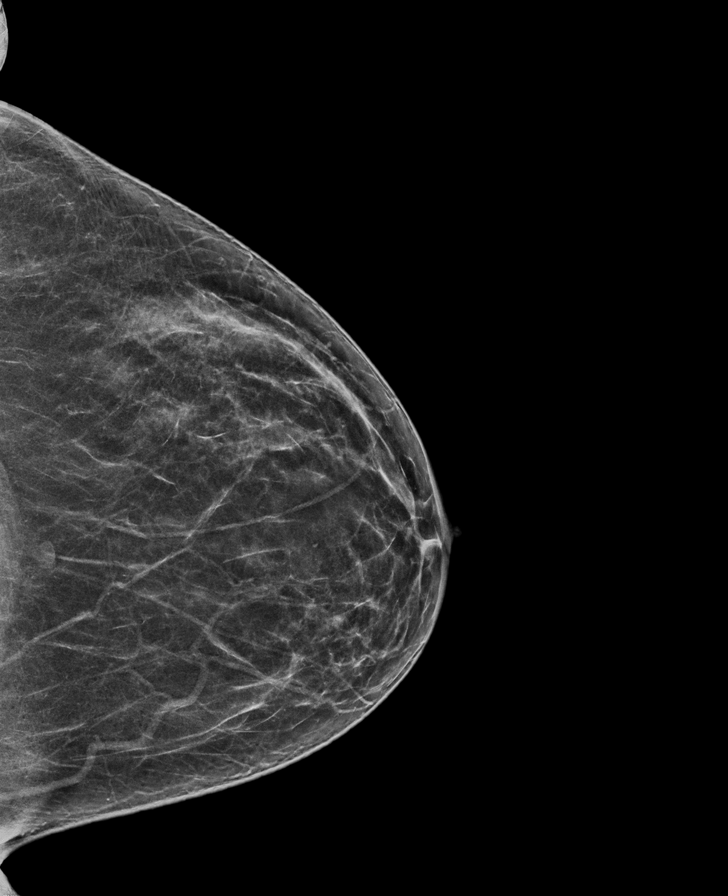

[R MLO synth-2D]
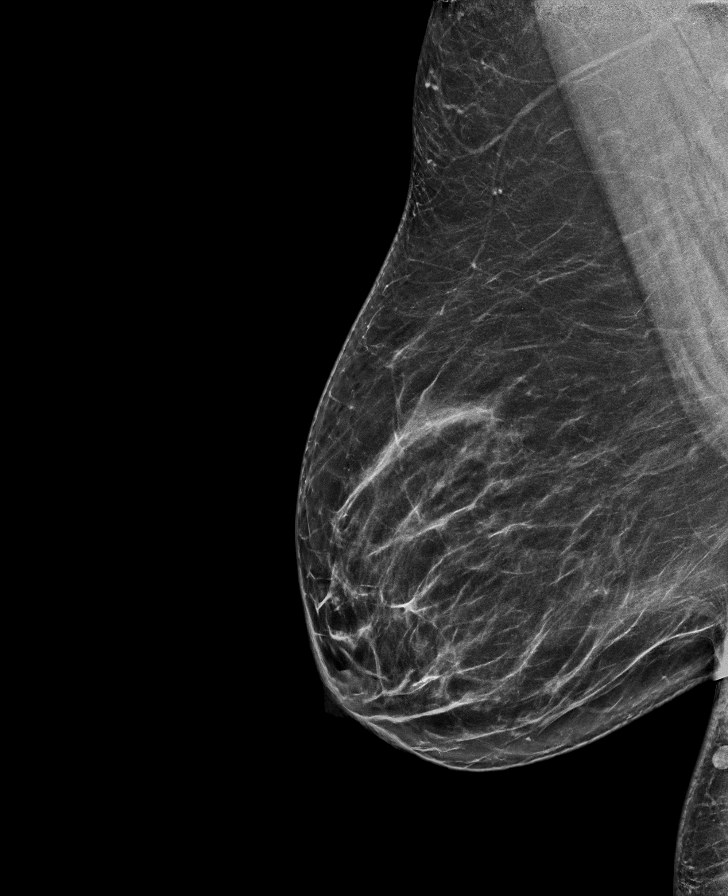

[L CC]
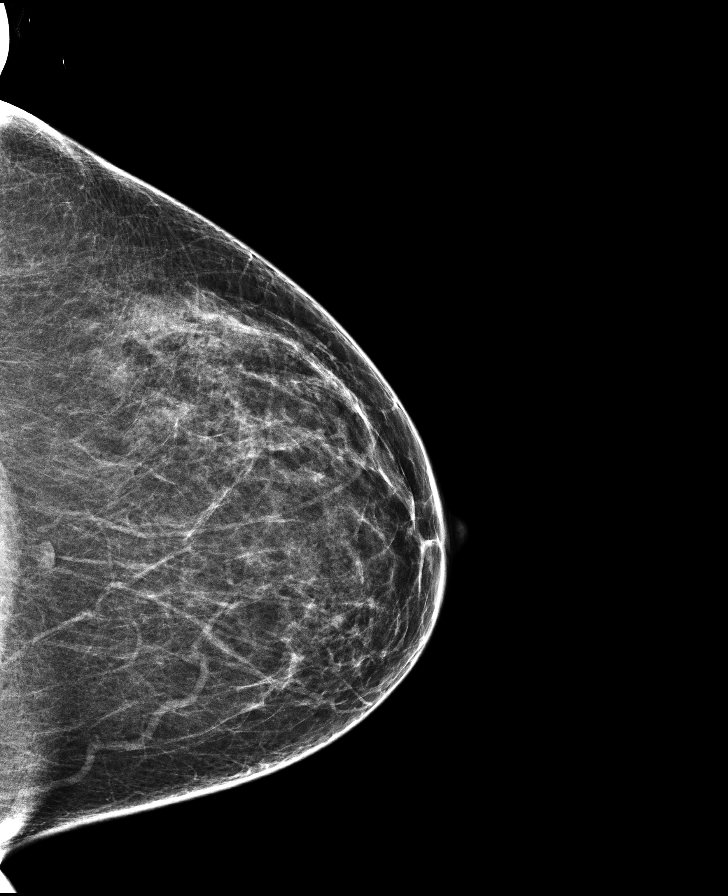

[R CC synth-2D]
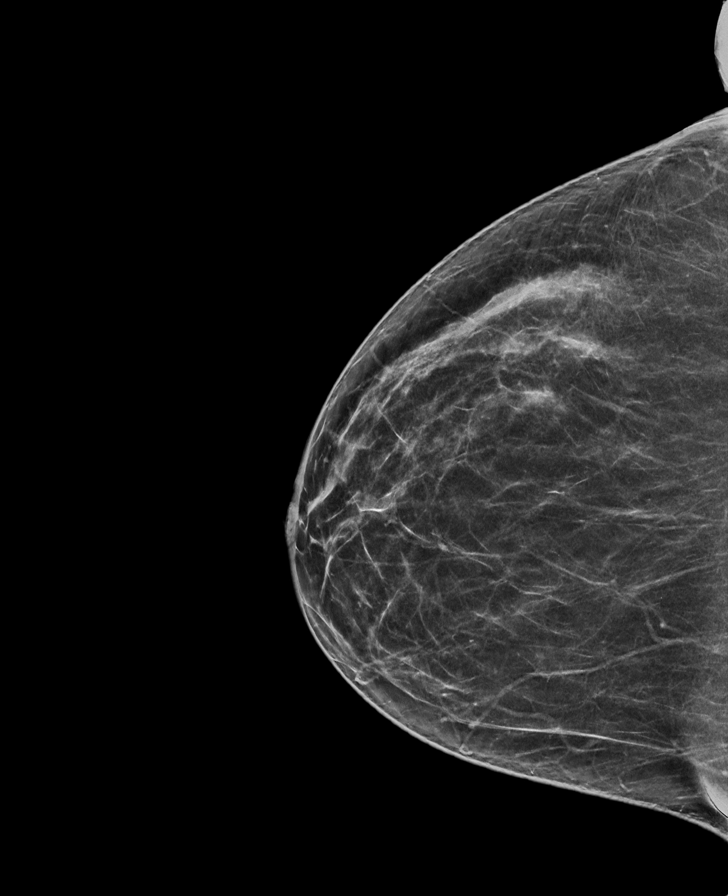

[R MLO]
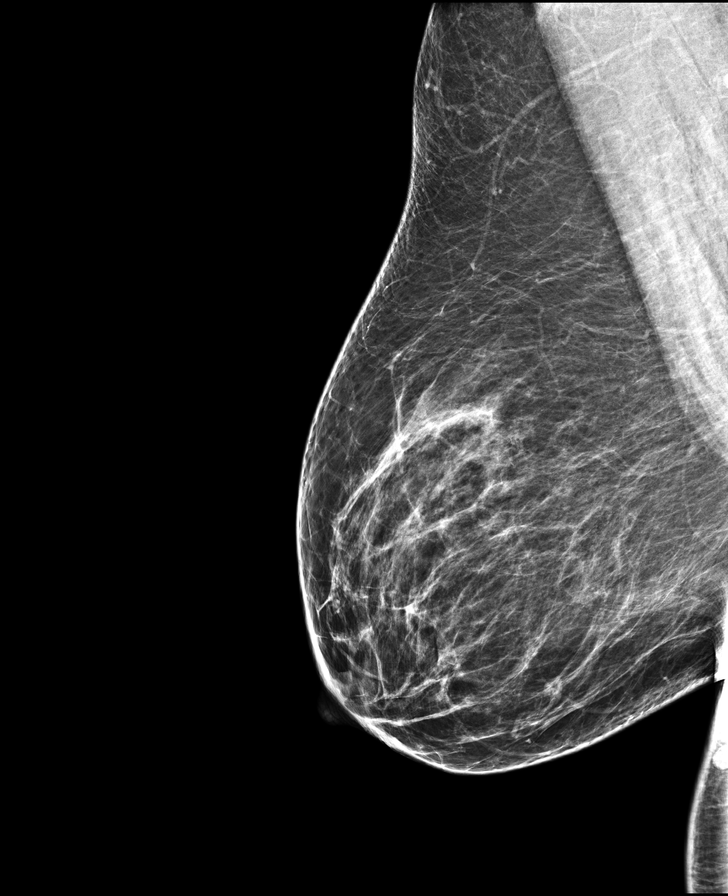

[8 of 28 positions shown; findings below may reference images not displayed]

ACR Breast Density Category b: There are scattered areas of
fibroglandular density.
FINDINGS: In the left breast, a possible asymmetry warrants further
evaluation. In the right breast, no findings suspicious for
malignancy. Images were processed with CAD.
IMPRESSION: Further evaluation is suggested for possible asymmetry in the left
breast.

RECOMMENDATION:
Diagnostic mammogram and possibly ultrasound of the left breast.
(Code:DI-[id number])

The patient will be contacted regarding the findings, and additional
imaging will be scheduled.

BI-RADS CATEGORY  0: Incomplete. Need additional imaging evaluation
and/or prior mammograms for comparison.

## 2018-12-10 ENCOUNTER — Other Ambulatory Visit: Payer: Self-pay | Admitting: Internal Medicine

## 2018-12-10 NOTE — Telephone Encounter (Signed)
rx ok'd for integra #30 with one refill.

## 2018-12-10 NOTE — Telephone Encounter (Signed)
Last OV 10/29/2018  Last refilled 10/22/2018 disp 30 with no refills   Sent to PCP for approval

## 2018-12-14 ENCOUNTER — Other Ambulatory Visit: Payer: Self-pay | Admitting: Internal Medicine

## 2019-01-18 ENCOUNTER — Other Ambulatory Visit: Payer: Self-pay | Admitting: Internal Medicine

## 2019-01-19 ENCOUNTER — Telehealth: Payer: Managed Care, Other (non HMO) | Admitting: Physician Assistant

## 2019-01-19 DIAGNOSIS — M542 Cervicalgia: Secondary | ICD-10-CM | POA: Diagnosis not present

## 2019-01-19 DIAGNOSIS — M546 Pain in thoracic spine: Secondary | ICD-10-CM | POA: Diagnosis not present

## 2019-01-19 MED ORDER — CYCLOBENZAPRINE HCL 10 MG PO TABS: 5.0000 mg | ORAL_TABLET | Freq: Three times a day (TID) | ORAL | 0 refills | Status: DC | PRN

## 2019-01-19 MED ORDER — NAPROXEN 500 MG PO TABS: 500.0000 mg | ORAL_TABLET | Freq: Two times a day (BID) | ORAL | 0 refills | Status: DC

## 2019-01-19 NOTE — Progress Notes (Signed)
We are sorry that you are not feeling well.  Here is how we plan to help!  Based on what you have shared with me it looks like you mostly have acute back pain.  Acute back pain is defined as musculoskeletal pain that can resolve in 1-3 weeks with conservative treatment.  I have prescribed Naprosyn 500 mg twice a day non-steroid anti-inflammatory (NSAID) as well as Flexeril 10 mg every eight hours as needed which is a muscle relaxer  Some patients experience stomach irritation or in increased heartburn with anti-inflammatory drugs.  Please keep in mind that muscle relaxer's can cause fatigue and should not be taken while at work or driving.  Back pain is very common.  The pain often gets better over time.  The cause of back pain is usually not dangerous.  Most people can learn to manage their back pain on their own.  Home Care Stay active.  Start with short walks on flat ground if you can.  Try to walk farther each day. Do not sit, drive or stand in one place for more than 30 minutes.  Do not stay in bed. Do not avoid exercise or work.  Activity can help your back heal faster. Be careful when you bend or lift an object.  Bend at your knees, keep the object close to you, and do not twist. Sleep on a firm mattress.  Lie on your side, and bend your knees.  If you lie on your back, put a pillow under your knees. Only take medicines as told by your doctor. Put ice on the injured area. Put ice in a plastic bag Place a towel between your skin and the bag Leave the ice on for 15-20 minutes, 3-4 times a day for the first 2-3 days. 210 After that, you can switch between ice and heat packs. Ask your doctor about back exercises or massage. Avoid feeling anxious or stressed.  Find good ways to deal with stress, such as exercise.  Get Help Right Way If: Your pain does not go away with rest or medicine. Your pain does not go away in 1 week. You have new problems. You do not feel well. The pain spreads  into your legs. You cannot control when you poop (bowel movement) or pee (urinate) You feel sick to your stomach (nauseous) or throw up (vomit) You have belly (abdominal) pain. You feel like you may pass out (faint). If you develop a fever.  Make Sure you: Understand these instructions. Will watch your condition Will get help right away if you are not doing well or get worse.  Your e-visit answers were reviewed by a board certified advanced clinical practitioner to complete your personal care plan.  Depending on the condition, your plan could have included both over the counter or prescription medications.  If there is a problem please reply once you have received a response from your provider.  Your safety is important to [place].  If you have drug allergies check your prescription carefully.    You can use MyChart to ask questions about today's visit, request a non-urgent call back, or ask for a work or school excuse for 24 hours related to this e-Visit. If it has been greater than 24 hours you will need to follow up with your provider, or enter a new e-Visit to address those concerns.  You will get an e-mail in the next two days asking about your experience.  I hope that your e-visit has been valuable  and will speed your recovery. Thank you for using e-visits.   ===View-only below this line===   ----- Message -----    From: [name] Nielsen    Sent: 01/19/2019 10:07 AM EDT      To: E-Visit Mailing List Subject: E-Visit Submission: Back Pain  E-Visit Submission: Back Pain --------------------------------  Question: Where are you having pain Answer:   Mid to upper back  Question: Are you having any of the following respiratory symptoms with your pain? Answer:   None  Question: Does the pain extend into your legs? Answer:   No  Question: Are you having any numbness or weakness of the legs? Answer:   No  Question: Does this pain radiate to the abdomen or groin? Answer:    No  Question: How bad is the pain? Answer:   The pain is severe  Question: Did you have an injury that caused the pain? Answer:   No, I cannot remember an injury  Question: How long has the pain been present? Answer:   Today and yesterday  Question: Have you had back pain in the past? Answer:   Yes, I have infrequently had pain similar to this before  Question: Do you have a history of fever associated with your back pain? Answer:   No  Question: Please list any medications you have previously taken for back pain. Answer:   Flexoril  Question: Do you have a fever? Answer:   No, I do not have a fever  Question: Do you have any of the following? Answer:   None of the above  Question: What makes the pain worse? Answer:   Any movement  Question: What makes the pain better Answer:   Nothing makes it better            None of the above  Question: Do you get relief with certain positions (even if only partial relief)? Answer:   No  Question: Have you ever been diagnosed with cancer? Answer:   No  Question: Have you ever been diagnosed with arthritis? Answer:   No  Question: Have you ever been diagnosed with osteoporosis or any other bone weakness? Answer:   No  Question: Have you ever had surgery on your back or spine? Answer:   No  Question: Do you have a history of Intravenous Drug Use? Answer:   No  Question: What is your usual health status? Answer:   I am active and can move normally  Question: Are you pregnant? Answer:   I am confident that I am not pregnant  Question: Are you breastfeeding? Answer:   No  Question: Please list your medication allergies that you may have ? (If 'none' , please list as 'none') Answer:   None  Question: Please list any additional comments  Answer:   The pain is in my upper back/lower neck. Mainly upper left side near my shoulder. My muscle is knotted and very painful.  I've been taking Tylenol, using a heating pad and muscle  rub since yesterday.  Nothing is working.             I'm working from home and I think it may be the position I have been sitting in while working. Other than that, I've had no injuries and done nothing out of the ordinary.  A total of 5-10 minutes was spent evaluating this patients questionnaire and formulating a plan of care.

## 2019-01-21 ENCOUNTER — Other Ambulatory Visit: Payer: Self-pay | Admitting: Internal Medicine

## 2019-02-05 ENCOUNTER — Encounter: Payer: Self-pay | Admitting: Internal Medicine

## 2019-02-05 ENCOUNTER — Encounter: Payer: Managed Care, Other (non HMO) | Admitting: Internal Medicine

## 2019-02-05 ENCOUNTER — Other Ambulatory Visit: Payer: Self-pay

## 2019-02-05 ENCOUNTER — Ambulatory Visit (INDEPENDENT_AMBULATORY_CARE_PROVIDER_SITE_OTHER): Payer: Managed Care, Other (non HMO) | Admitting: Internal Medicine

## 2019-02-05 DIAGNOSIS — I10 Essential (primary) hypertension: Secondary | ICD-10-CM

## 2019-02-05 DIAGNOSIS — R945 Abnormal results of liver function studies: Secondary | ICD-10-CM | POA: Diagnosis not present

## 2019-02-05 DIAGNOSIS — F320 Major depressive disorder, single episode, mild: Secondary | ICD-10-CM

## 2019-02-05 DIAGNOSIS — F32A Depression, unspecified: Secondary | ICD-10-CM

## 2019-02-05 DIAGNOSIS — R7989 Other specified abnormal findings of blood chemistry: Secondary | ICD-10-CM

## 2019-02-05 DIAGNOSIS — E039 Hypothyroidism, unspecified: Secondary | ICD-10-CM

## 2019-02-05 DIAGNOSIS — D649 Anemia, unspecified: Secondary | ICD-10-CM

## 2019-02-05 DIAGNOSIS — E7800 Pure hypercholesterolemia, unspecified: Secondary | ICD-10-CM | POA: Diagnosis not present

## 2019-02-05 MED ORDER — LISINOPRIL 10 MG PO TABS: 10.0000 mg | ORAL_TABLET | Freq: Every day | ORAL | 1 refills | Status: DC

## 2019-02-05 NOTE — Progress Notes (Signed)
Patient ID: [name] DoloresElizabeth Nielsen, female   DOB: May 30, 1976, 43 y.o.   MRN: [id number] Virtual Visit via Video Note  This visit type was conducted due to national recommendations for restrictions regarding the COVID-19 pandemic (e.g. social distancing).  This format is felt to be most appropriate for this patient at this time.  All issues noted in this document were discussed and addressed.  No physical exam was performed (except for noted visual exam findings with Video Visits).   I connected with@ on 02/05/19 at 10:30 AM EDT by a video enabled telemedicine application or telephone and verified that I am speaking with the correct person using two identifiers. Location patient: home Location provider: work Persons participating in the virtual visit: patient, provider  I discussed the limitations, risks, security and privacy concerns of performing an evaluation and management service by video and the availability of in person appointments. The patient expressed understanding and agreed to proceed.   Reason for visit: scheduled follow up.   HPI: Scheduled follow up. States she is doing well.  Feels good.  Working from home.  Handling stress.  On zoloft.  Does not feel she needs anything more at this time.  Had E visit 01/19/19 for back pain.  Was given naprosyn and flexeril.  Back is better now.  Previous visit was concerned regarding weight gain.  Has not gained more.  Plans to adjust diet and start exercising more.  Has started walking.  No chest pain. No sob.  No acid reflux.  No abdominal pain.  Bowels moving.  Saw gyn for heavy periods.  Planning to see Dr Evelene CroonKaur for history of binge eating disorder.  Taking lisinopril.  Blood pressure under good control. No dizziness or light headedness.  No headache.  Taking crestor.     ROS: See pertinent positives and negatives per HPI.  Past Medical History:  Diagnosis Date  . Anemia    hx of   . Anxiety   . Depression   . Frequent headaches    hx of  .  Heart murmur   . Hx: UTI (urinary tract infection)    child, s/p urinary procecure  . Hypercholesterolemia   . Hypertension   . Hypothyroidism     Past Surgical History:  Procedure Laterality Date  . CHOLECYSTECTOMY  2012  . LAPAROSCOPIC GASTRIC SLEEVE RESECTION N/A 07/05/2014   Procedure: LAPAROSCOPIC GASTRIC SLEEVE RESECTION with upper endoscopy;  Surgeon: Wenda LowMatt Martin, MD;  Location: [facility];  Service: General;  Laterality: N/A;  . TUBAL LIGATION  1999    Family History  Problem Relation Age of Onset  . Cancer Mother        brain  . Diabetes Father   . Hypertension Father   . Kidney disease Father   . Stroke Father   . Cancer Father        prostate  . Breast cancer Neg Hx     SOCIAL HX: reviewed.    Current Outpatient Medications:  .  acetaminophen (TYLENOL) 500 MG tablet, Take 1,000 mg by mouth every 6 (six) hours as needed for mild pain or moderate pain., Disp: , Rfl:  .  ALPRAZolam (XANAX) 0.25 MG tablet, Take 1 tablet (0.25 mg total) by mouth daily as needed for anxiety., Disp: 30 tablet, Rfl: 0 .  Cyanocobalamin (B-12 PO), Take by mouth., Disp: , Rfl:  .  cyclobenzaprine (FLEXERIL) 10 MG tablet, Take 0.5-1 tablets (5-10 mg total) by mouth 3 (three) times daily as needed for muscle spasms., Disp:  30 tablet, Rfl: 0 .  Fe Fum-FePoly-Vit C-Vit B3 (INTEGRA) 62.5-62.5-40-3 MG CAPS, Take 1 capsule by mouth once daily, Disp: 30 each, Rfl: 1 .  hydrochlorothiazide (MICROZIDE) 12.5 MG capsule, Take 1 capsule by mouth once daily, Disp: 90 capsule, Rfl: 0 .  levothyroxine (SYNTHROID, LEVOTHROID) 150 MCG tablet, TAKE 1 TABLET BY MOUTH ONCE DAILY BEFORE BREAKFAST, Disp: 90 tablet, Rfl: 1 .  lisinopril (ZESTRIL) 10 MG tablet, Take 1 tablet (10 mg total) by mouth daily., Disp: 90 tablet, Rfl: 1 .  rosuvastatin (CRESTOR) 5 MG tablet, TAKE 1 TABLET BY MOUTH ONCE DAILY, Disp: 90 tablet, Rfl: 3 .  sertraline (ZOLOFT) 50 MG tablet, Take 1 tablet by mouth once daily, Disp: 30 tablet, Rfl:  2  EXAM:  GENERAL: alert, oriented, appears well and in no acute distress  HEENT: atraumatic, conjunttiva clear, no obvious abnormalities on inspection of external nose and ears  NECK: normal movements of the head and neck  LUNGS: on inspection no signs of respiratory distress, breathing rate appears normal, no obvious gross SOB, gasping or wheezing  CV: no obvious cyanosis  PSYCH/NEURO: pleasant and cooperative, no obvious depression or anxiety, speech and thought processing grossly intact  ASSESSMENT AND PLAN:  Discussed the following assessment and plan:  Abnormal liver function test  Anemia, unspecified type  Essential hypertension, benign  Hypercholesterolemia  Hypothyroidism, unspecified type  Mild depression (HCC)  Abnormal liver function test Last liver panel wnl 07/2018.    Anemia Has a history of iron deficient anemia.  Follow cbc and ferritin.    Essential hypertension, benign Blood pressure per her report doing well.  Follow pressures.  Same medication regimen.  Follow metabolic panel.   Hypercholesterolemia On crestor.  Low cholesterol diet and exercise.  Follow lipid panel and liver function tests.    Hypothyroidism On thyroid replacement. Follow tsh.   Mild depression (HCC) On zoloft.  Doing well.  Follow.      I discussed the assessment and treatment plan with the patient. The patient was provided an opportunity to ask questions and all were answered. The patient agreed with the plan and demonstrated an understanding of the instructions.   The patient was advised to call back or seek an in-person evaluation if the symptoms worsen or if the condition fails to improve as anticipated.    Dale Foristell, MD

## 2019-02-07 ENCOUNTER — Encounter: Payer: Self-pay | Admitting: Internal Medicine

## 2019-02-07 NOTE — Assessment & Plan Note (Signed)
Last liver panel wnl 07/2018.

## 2019-02-07 NOTE — Assessment & Plan Note (Signed)
On zoloft.  Doing well.  Follow.  

## 2019-02-07 NOTE — Assessment & Plan Note (Signed)
On crestor.  Low cholesterol diet and exercise.  Follow lipid panel and liver function tests.   

## 2019-02-07 NOTE — Assessment & Plan Note (Signed)
Blood pressure per her report doing well.  Follow pressures.  Same medication regimen.  Follow metabolic panel.

## 2019-02-07 NOTE — Assessment & Plan Note (Signed)
On thyroid replacement.  Follow tsh.  

## 2019-02-07 NOTE — Assessment & Plan Note (Signed)
Has a history of iron deficient anemia.  Follow cbc and ferritin.   

## 2019-03-09 ENCOUNTER — Other Ambulatory Visit: Payer: Self-pay | Admitting: Internal Medicine

## 2019-03-09 ENCOUNTER — Other Ambulatory Visit: Payer: Self-pay | Admitting: Physician Assistant

## 2019-03-14 ENCOUNTER — Other Ambulatory Visit: Payer: Self-pay | Admitting: Internal Medicine

## 2019-03-15 ENCOUNTER — Encounter: Payer: Self-pay | Admitting: Internal Medicine

## 2019-03-19 ENCOUNTER — Ambulatory Visit (INDEPENDENT_AMBULATORY_CARE_PROVIDER_SITE_OTHER): Payer: Managed Care, Other (non HMO) | Admitting: Internal Medicine

## 2019-03-19 ENCOUNTER — Other Ambulatory Visit: Payer: Self-pay

## 2019-03-19 DIAGNOSIS — I10 Essential (primary) hypertension: Secondary | ICD-10-CM | POA: Diagnosis not present

## 2019-03-19 DIAGNOSIS — Z1231 Encounter for screening mammogram for malignant neoplasm of breast: Secondary | ICD-10-CM | POA: Diagnosis not present

## 2019-03-19 DIAGNOSIS — F320 Major depressive disorder, single episode, mild: Secondary | ICD-10-CM | POA: Diagnosis not present

## 2019-03-19 DIAGNOSIS — F32A Depression, unspecified: Secondary | ICD-10-CM

## 2019-03-19 MED ORDER — BUPROPION HCL ER (XL) 150 MG PO TB24: 150.0000 mg | ORAL_TABLET | Freq: Every day | ORAL | 1 refills | Status: DC

## 2019-03-19 NOTE — Progress Notes (Signed)
Patient ID: [name] Nielsen, female   DOB: [birth year], 43 y.o.   MRN: [id number]   Virtual Visit via video Note  This visit type was conducted due to national recommendations for restrictions regarding the COVID-19 pandemic (e.g. social distancing).  This format is felt to be most appropriate for this patient at this time.  All issues noted in this document were discussed and addressed.  No physical exam was performed (except for noted visual exam findings with Video Visits).   I connected with [name] Nielsen by a video enabled telemedicine application or telephone and verified that I am speaking with the correct person using two identifiers. Location patient: home Location provider: work  Persons participating in the virtual visit: patient, provider  I discussed the limitations, risks, security and privacy concerns of performing an evaluation and management service by video and the availability of in person appointments.  The patient expressed understanding and agreed to proceed.   Reason for visit: acute visit.   HPI: She reports she has done well on zoloft regarding her stress, etc. Is concerned that zoloft has contributed to her weight gain.  States she has gained 40 pounds since being on the medications. Has adjusted her diet and has started exercising.  Walking 4 miles per day.  Has lost back - 15 pounds.  No chest pain.  No sob.  No acid reflux.  No abdominal pain.  Bowels moving.  She was questioning starting wellbutrin.  Still feels needs to be on something.  No known covid exposure.  No fever.  No cough, congestion or sob.     ROS: See pertinent positives and negatives per HPI.  Past Medical History:  Diagnosis Date  . Anemia    hx of   . Anxiety   . Depression   . Frequent headaches    hx of  . Heart murmur   . Hx: UTI (urinary tract infection)    child, s/p urinary procecure  . Hypercholesterolemia   . Hypertension   . Hypothyroidism     Past Surgical History:   Procedure Laterality Date  . CHOLECYSTECTOMY  2012  . LAPAROSCOPIC GASTRIC SLEEVE RESECTION N/A 07/05/2014   Procedure: LAPAROSCOPIC GASTRIC SLEEVE RESECTION with upper endoscopy;  Surgeon: Kaylyn Lim, MD;  Location: [facility];  Service: General;  Laterality: N/A;  . TUBAL LIGATION  1999    Family History  Problem Relation Age of Onset  . Cancer Mother        brain  . Diabetes Father   . Hypertension Father   . Kidney disease Father   . Stroke Father   . Cancer Father        prostate  . Breast cancer Neg Hx     SOCIAL HX: reviewed.    Current Outpatient Medications:  .  acetaminophen (TYLENOL) 500 MG tablet, Take 1,000 mg by mouth every 6 (six) hours as needed for mild pain or moderate pain., Disp: , Rfl:  .  ALPRAZolam (XANAX) 0.25 MG tablet, Take 1 tablet (0.25 mg total) by mouth daily as needed for anxiety., Disp: 30 tablet, Rfl: 0 .  buPROPion (WELLBUTRIN XL) 150 MG 24 hr tablet, Take 1 tablet (150 mg total) by mouth daily., Disp: 30 tablet, Rfl: 1 .  Cyanocobalamin (B-12 PO), Take by mouth., Disp: , Rfl:  .  cyclobenzaprine (FLEXERIL) 10 MG tablet, Take 0.5-1 tablets (5-10 mg total) by mouth 3 (three) times daily as needed for muscle spasms., Disp: 30 tablet, Rfl: 0 .  Fe Fum-FePoly-Vit  C-Vit B3 (INTEGRA) 62.5-62.5-40-3 MG CAPS, Take 1 capsule by mouth once daily, Disp: 30 each, Rfl: 1 .  hydrochlorothiazide (MICROZIDE) 12.5 MG capsule, Take 1 capsule by mouth once daily, Disp: 90 capsule, Rfl: 0 .  levothyroxine (SYNTHROID) 150 MCG tablet, TAKE 1 TABLET BY MOUTH ONCE DAILY BEFORE BREAKFAST, Disp: 90 tablet, Rfl: 0 .  lisinopril (ZESTRIL) 10 MG tablet, Take 1 tablet (10 mg total) by mouth daily., Disp: 90 tablet, Rfl: 1 .  rosuvastatin (CRESTOR) 5 MG tablet, TAKE 1 TABLET BY MOUTH ONCE DAILY, Disp: 90 tablet, Rfl: 3 .  sertraline (ZOLOFT) 50 MG tablet, Take 1 tablet by mouth once daily, Disp: 30 tablet, Rfl: 2  EXAM:  GENERAL: alert, oriented, appears well and in no acute  distress  HEENT: atraumatic, conjunttiva clear, no obvious abnormalities on inspection of external nose and ears  NECK: normal movements of the head and neck  LUNGS: on inspection no signs of respiratory distress, breathing rate appears normal, no obvious gross SOB, gasping or wheezing  CV: no obvious cyanosis  PSYCH/NEURO: pleasant and cooperative, no obvious depression or anxiety, speech and thought processing grossly intact  ASSESSMENT AND PLAN:  Discussed the following assessment and plan:  Essential hypertension, benign On lisinopril.  Reports blood pressure has been doing well.   Mild depression (HCC) Has been on zoloft and doing well.  Concerned regarding weight gain.  Will taper zoloft as directed.  Start wellbutrin 150mg  q day. Follow closely.  Schedule f/u to assess how she is doing on the new medication.  She will keep me posted.      I discussed the assessment and treatment plan with the patient. The patient was provided an opportunity to ask questions and all were answered. The patient agreed with the plan and demonstrated an understanding of the instructions.   The patient was advised to call back or seek an in-person evaluation if the symptoms worsen or if the condition fails to improve as anticipated.    Dale Durhamharlene Dainel Arcidiacono, MD

## 2019-03-22 ENCOUNTER — Encounter: Payer: Self-pay | Admitting: Internal Medicine

## 2019-03-22 NOTE — Assessment & Plan Note (Signed)
Has been on zoloft and doing well.  Concerned regarding weight gain.  Will taper zoloft as directed.  Start wellbutrin 150mg  q day. Follow closely.  Schedule f/u to assess how she is doing on the new medication.  She will keep me posted.

## 2019-03-22 NOTE — Assessment & Plan Note (Signed)
On lisinopril.  Reports blood pressure has been doing well.

## 2019-03-30 ENCOUNTER — Encounter: Payer: Self-pay | Admitting: Internal Medicine

## 2019-04-13 ENCOUNTER — Telehealth: Payer: Self-pay | Admitting: *Deleted

## 2019-04-13 DIAGNOSIS — I10 Essential (primary) hypertension: Secondary | ICD-10-CM

## 2019-04-13 DIAGNOSIS — R7989 Other specified abnormal findings of blood chemistry: Secondary | ICD-10-CM

## 2019-04-13 DIAGNOSIS — D649 Anemia, unspecified: Secondary | ICD-10-CM

## 2019-04-13 DIAGNOSIS — E7800 Pure hypercholesterolemia, unspecified: Secondary | ICD-10-CM

## 2019-04-13 DIAGNOSIS — R945 Abnormal results of liver function studies: Secondary | ICD-10-CM

## 2019-04-13 DIAGNOSIS — R739 Hyperglycemia, unspecified: Secondary | ICD-10-CM

## 2019-04-13 DIAGNOSIS — E039 Hypothyroidism, unspecified: Secondary | ICD-10-CM

## 2019-04-13 NOTE — Telephone Encounter (Signed)
Orders placed for f/u labs.  

## 2019-04-13 NOTE — Telephone Encounter (Signed)
Please place future orders for lab appt.  

## 2019-04-15 ENCOUNTER — Other Ambulatory Visit: Payer: Self-pay

## 2019-04-15 ENCOUNTER — Other Ambulatory Visit: Payer: Managed Care, Other (non HMO)

## 2019-04-20 ENCOUNTER — Other Ambulatory Visit (INDEPENDENT_AMBULATORY_CARE_PROVIDER_SITE_OTHER): Payer: Managed Care, Other (non HMO)

## 2019-04-20 ENCOUNTER — Other Ambulatory Visit: Payer: Self-pay

## 2019-04-20 DIAGNOSIS — R945 Abnormal results of liver function studies: Secondary | ICD-10-CM

## 2019-04-20 DIAGNOSIS — D649 Anemia, unspecified: Secondary | ICD-10-CM | POA: Diagnosis not present

## 2019-04-20 DIAGNOSIS — I10 Essential (primary) hypertension: Secondary | ICD-10-CM

## 2019-04-20 DIAGNOSIS — R7989 Other specified abnormal findings of blood chemistry: Secondary | ICD-10-CM

## 2019-04-20 DIAGNOSIS — E7800 Pure hypercholesterolemia, unspecified: Secondary | ICD-10-CM

## 2019-04-20 DIAGNOSIS — R739 Hyperglycemia, unspecified: Secondary | ICD-10-CM

## 2019-04-21 LAB — CBC WITH DIFFERENTIAL/PLATELET
Basophils Absolute: 0 10*3/uL (ref 0.0–0.2)
Basos: 1 %
EOS (ABSOLUTE): 0.1 10*3/uL (ref 0.0–0.4)
Eos: 2 %
Hematocrit: 36.1 % (ref 34.0–46.6)
Hemoglobin: 11.7 g/dL (ref 11.1–15.9)
Immature Grans (Abs): 0 10*3/uL (ref 0.0–0.1)
Immature Granulocytes: 0 %
Lymphocytes Absolute: 1.6 10*3/uL (ref 0.7–3.1)
Lymphs: 28 %
MCH: 25.2 pg — ABNORMAL LOW (ref 26.6–33.0)
MCHC: 32.4 g/dL (ref 31.5–35.7)
MCV: 78 fL — ABNORMAL LOW (ref 79–97)
Monocytes Absolute: 0.5 10*3/uL (ref 0.1–0.9)
Monocytes: 9 %
Neutrophils Absolute: 3.6 10*3/uL (ref 1.4–7.0)
Neutrophils: 60 %
Platelets: 279 10*3/uL (ref 150–450)
RBC: 4.64 x10E6/uL (ref 3.77–5.28)
RDW: 13.6 % (ref 11.7–15.4)
WBC: 5.9 10*3/uL (ref 3.4–10.8)

## 2019-04-21 LAB — LIPID PANEL
Chol/HDL Ratio: 4.1 ratio (ref 0.0–4.4)
Cholesterol, Total: 232 mg/dL — ABNORMAL HIGH (ref 100–199)
HDL: 57 mg/dL (ref >39)
LDL Calculated: 156 mg/dL — ABNORMAL HIGH (ref 0–99)
Triglycerides: 96 mg/dL (ref 0–149)
VLDL Cholesterol Cal: 19 mg/dL (ref 5–40)

## 2019-04-21 LAB — BASIC METABOLIC PANEL
BUN/Creatinine Ratio: 10 (ref 9–23)
BUN: 10 mg/dL (ref 6–24)
CO2: 24 mmol/L (ref 20–29)
Calcium: 9.1 mg/dL (ref 8.7–10.2)
Chloride: 97 mmol/L (ref 96–106)
Creatinine, Ser: 0.97 mg/dL (ref 0.57–1.00)
GFR calc Af Amer: 82 mL/min/{1.73_m2} (ref >59)
GFR calc non Af Amer: 71 mL/min/{1.73_m2} (ref >59)
Glucose: 87 mg/dL (ref 65–99)
Potassium: 3.6 mmol/L (ref 3.5–5.2)
Sodium: 138 mmol/L (ref 134–144)

## 2019-04-21 LAB — HEPATIC FUNCTION PANEL
ALT: 19 IU/L (ref 0–32)
AST: 16 IU/L (ref 0–40)
Albumin: 4.1 g/dL (ref 3.8–4.8)
Alkaline Phosphatase: 33 IU/L — ABNORMAL LOW (ref 39–117)
Bilirubin Total: 0.3 mg/dL (ref 0.0–1.2)
Bilirubin, Direct: 0.06 mg/dL (ref 0.00–0.40)
Total Protein: 6.8 g/dL (ref 6.0–8.5)

## 2019-04-21 LAB — FERRITIN: Ferritin: 131 ng/mL (ref 15–150)

## 2019-04-21 LAB — HEMOGLOBIN A1C
Est. average glucose Bld gHb Est-mCnc: 114 mg/dL
Hgb A1c MFr Bld: 5.6 % (ref 4.8–5.6)

## 2019-04-26 ENCOUNTER — Other Ambulatory Visit: Payer: Self-pay

## 2019-04-26 ENCOUNTER — Encounter: Payer: Self-pay | Admitting: Internal Medicine

## 2019-04-26 ENCOUNTER — Other Ambulatory Visit (INDEPENDENT_AMBULATORY_CARE_PROVIDER_SITE_OTHER): Payer: Managed Care, Other (non HMO)

## 2019-04-26 DIAGNOSIS — R300 Dysuria: Secondary | ICD-10-CM | POA: Diagnosis not present

## 2019-04-26 NOTE — Telephone Encounter (Signed)
I have scheduled her for a urine and placed order for labs. She has a doxy appt in the am to discuss.

## 2019-04-26 NOTE — Telephone Encounter (Signed)
Noted.  Confirm pt ok with waiting for tomorrow.

## 2019-04-26 NOTE — Telephone Encounter (Signed)
She is fine to wait until tomorrow.

## 2019-04-26 NOTE — Addendum Note (Signed)
Addended by: Leeanne Rio on: 04/26/2019 03:09 PM   Modules accepted: Orders

## 2019-04-27 ENCOUNTER — Encounter: Payer: Self-pay | Admitting: Internal Medicine

## 2019-04-27 ENCOUNTER — Ambulatory Visit (INDEPENDENT_AMBULATORY_CARE_PROVIDER_SITE_OTHER): Payer: Managed Care, Other (non HMO) | Admitting: Internal Medicine

## 2019-04-27 DIAGNOSIS — R102 Pelvic and perineal pain: Secondary | ICD-10-CM | POA: Diagnosis not present

## 2019-04-27 DIAGNOSIS — I10 Essential (primary) hypertension: Secondary | ICD-10-CM

## 2019-04-27 DIAGNOSIS — F32A Depression, unspecified: Secondary | ICD-10-CM

## 2019-04-27 DIAGNOSIS — F320 Major depressive disorder, single episode, mild: Secondary | ICD-10-CM

## 2019-04-27 LAB — URINALYSIS, ROUTINE W REFLEX MICROSCOPIC
Bilirubin, UA: NEGATIVE
Glucose, UA: NEGATIVE
Leukocytes,UA: NEGATIVE
Nitrite, UA: NEGATIVE
Protein,UA: NEGATIVE
RBC, UA: NEGATIVE
Specific Gravity, UA: 1.006 (ref 1.005–1.030)
Urobilinogen, Ur: 0.2 mg/dL (ref 0.2–1.0)
pH, UA: 7 (ref 5.0–7.5)

## 2019-04-27 MED ORDER — PHENAZOPYRIDINE HCL 200 MG PO TABS: 200.0000 mg | ORAL_TABLET | Freq: Three times a day (TID) | ORAL | 0 refills | Status: DC | PRN

## 2019-04-27 NOTE — Progress Notes (Signed)
Patient ID: [name] Nielsen, female   DOB: 1975/11/05, 43 y.o.   MRN: [id number]   Virtual Visit via video Note  This visit type was conducted due to national recommendations for restrictions regarding the COVID-19 pandemic (e.g. social distancing).  This format is felt to be most appropriate for this patient at this time.  All issues noted in this document were discussed and addressed.  No physical exam was performed (except for noted visual exam findings with Video Visits).   I connected with [name] Nielsen by a video enabled telemedicine application and verified that I am speaking with the correct person using two identifiers. Location patient: home Location provider: work Persons participating in the virtual visit: patient, provider  I discussed the limitations, risks, security and privacy concerns of performing an evaluation and management service by video and the availability of in person appointments.  The patient expressed understanding and agreed to proceed.   Reason for visit: work in appt  HPI: She was scheduled a work in appt for concerns regarding possible uti.  Noticed over the last couple of days some increased pressure - over pubic bone.  No hematuria.  No dysuria.  Some increased frequency.  Pressure worse as bladder fills.  No hematuria.  No vaginal itching, irritation or discharge.  No fever.  Discussed her bowels.  Has not had bowel movement in over one week.  Feels symptoms possibly could be related to this.  Discussed using suppository/enema to get things moving.  Discussed miralax after initial bm.  Doing well on wellbutrin.  Recently changed from zoloft.  Handling stress.     ROS: See pertinent positives and negatives per HPI.  Past Medical History:  Diagnosis Date  . Anemia    hx of   . Anxiety   . Depression   . Frequent headaches    hx of  . Heart murmur   . Hx: UTI (urinary tract infection)    child, s/p urinary procecure  . Hypercholesterolemia   .  Hypertension   . Hypothyroidism     Past Surgical History:  Procedure Laterality Date  . CHOLECYSTECTOMY  2012  . LAPAROSCOPIC GASTRIC SLEEVE RESECTION N/A 07/05/2014   Procedure: LAPAROSCOPIC GASTRIC SLEEVE RESECTION with upper endoscopy;  Surgeon: Kaylyn Lim, MD;  Location: [facility];  Service: General;  Laterality: N/A;  . TUBAL LIGATION  1999    Family History  Problem Relation Age of Onset  . Cancer Mother        brain  . Diabetes Father   . Hypertension Father   . Kidney disease Father   . Stroke Father   . Cancer Father        prostate  . Breast cancer Neg Hx     SOCIAL HX: reviewed.    Current Outpatient Medications:  .  acetaminophen (TYLENOL) 500 MG tablet, Take 1,000 mg by mouth every 6 (six) hours as needed for mild pain or moderate pain., Disp: , Rfl:  .  ALPRAZolam (XANAX) 0.25 MG tablet, Take 1 tablet (0.25 mg total) by mouth daily as needed for anxiety., Disp: 30 tablet, Rfl: 0 .  buPROPion (WELLBUTRIN XL) 150 MG 24 hr tablet, Take 1 tablet (150 mg total) by mouth daily., Disp: 30 tablet, Rfl: 1 .  Cyanocobalamin (B-12 PO), Take by mouth., Disp: , Rfl:  .  cyclobenzaprine (FLEXERIL) 10 MG tablet, Take 0.5-1 tablets (5-10 mg total) by mouth 3 (three) times daily as needed for muscle spasms., Disp: 30 tablet, Rfl: 0 .  Fe Fum-FePoly-Vit C-Vit B3 (INTEGRA) 62.5-62.5-40-3 MG CAPS, Take 1 capsule by mouth once daily, Disp: 30 each, Rfl: 1 .  hydrochlorothiazide (MICROZIDE) 12.5 MG capsule, Take 1 capsule by mouth once daily, Disp: 90 capsule, Rfl: 0 .  levothyroxine (SYNTHROID) 150 MCG tablet, TAKE 1 TABLET BY MOUTH ONCE DAILY BEFORE BREAKFAST, Disp: 90 tablet, Rfl: 0 .  lisinopril (ZESTRIL) 10 MG tablet, Take 1 tablet (10 mg total) by mouth daily., Disp: 90 tablet, Rfl: 1 .  phenazopyridine (PYRIDIUM) 200 MG tablet, Take 1 tablet (200 mg total) by mouth 3 (three) times daily as needed for pain., Disp: 6 tablet, Rfl: 0 .  rosuvastatin (CRESTOR) 5 MG tablet, TAKE 1  TABLET BY MOUTH ONCE DAILY, Disp: 90 tablet, Rfl: 3 .  sertraline (ZOLOFT) 50 MG tablet, Take 1 tablet by mouth once daily, Disp: 30 tablet, Rfl: 2  EXAM:  GENERAL: alert, oriented, appears well and in no acute distress  HEENT: atraumatic, conjunttiva clear, no obvious abnormalities on inspection of external nose and ears  NECK: normal movements of the head and neck  LUNGS: on inspection no signs of respiratory distress, breathing rate appears normal, no obvious gross SOB, gasping or wheezing  CV: no obvious cyanosis  PSYCH/NEURO: pleasant and cooperative, no obvious depression or anxiety, speech and thought processing grossly intact  ASSESSMENT AND PLAN:  Discussed the following assessment and plan:  Essential hypertension, benign States blood pressure doing well.  Follow pressures.  Follow metabolic panel.   Mild depression (HCC) Now on wellbutrin and doing well.  Follow.    Pelvic pressure in female Described the pressure as outlined.  No dysuria.  No hematuria.  No vaginal symptoms.  Some increased frequency. Initial urine - ok.  Will send culture. Hold abx.  pyrdium as directed.  Also discussed bowel issue.  Could be contributing.  Have her use suppository/enema to get bowels stimulated/started.  After bm continue miralax on a regular schedule.  Follow.      I discussed the assessment and treatment plan with the patient. The patient was provided an opportunity to ask questions and all were answered. The patient agreed with the plan and demonstrated an understanding of the instructions.   The patient was advised to call back or seek an in-person evaluation if the symptoms worsen or if the condition fails to improve as anticipated.   Dale Durhamharlene Soundra Lampley, MD

## 2019-04-28 ENCOUNTER — Encounter: Payer: Self-pay | Admitting: Internal Medicine

## 2019-04-28 LAB — URINE CULTURE

## 2019-05-02 ENCOUNTER — Encounter: Payer: Self-pay | Admitting: Internal Medicine

## 2019-05-02 DIAGNOSIS — R102 Pelvic and perineal pain: Secondary | ICD-10-CM | POA: Insufficient documentation

## 2019-05-02 NOTE — Assessment & Plan Note (Signed)
Described the pressure as outlined.  No dysuria.  No hematuria.  No vaginal symptoms.  Some increased frequency. Initial urine - ok.  Will send culture. Hold abx.  pyrdium as directed.  Also discussed bowel issue.  Could be contributing.  Have her use suppository/enema to get bowels stimulated/started.  After bm continue miralax on a regular schedule.  Follow.

## 2019-05-02 NOTE — Assessment & Plan Note (Signed)
States blood pressure doing well.  Follow pressures.  Follow metabolic panel.

## 2019-05-02 NOTE — Assessment & Plan Note (Signed)
Now on wellbutrin and doing well.  Follow.

## 2019-05-04 ENCOUNTER — Other Ambulatory Visit: Payer: Self-pay | Admitting: Internal Medicine

## 2019-05-07 ENCOUNTER — Ambulatory Visit
Admission: RE | Admit: 2019-05-07 | Discharge: 2019-05-07 | Disposition: A | Discharge status: Home / Self Care | Payer: Managed Care, Other (non HMO) | Source: Ambulatory Visit | Attending: Internal Medicine | Admitting: Internal Medicine

## 2019-05-07 DIAGNOSIS — Z1231 Encounter for screening mammogram for malignant neoplasm of breast: Secondary | ICD-10-CM | POA: Diagnosis not present

## 2019-05-12 ENCOUNTER — Other Ambulatory Visit: Payer: Self-pay | Admitting: Internal Medicine

## 2019-05-20 ENCOUNTER — Other Ambulatory Visit: Payer: Self-pay

## 2019-05-24 ENCOUNTER — Other Ambulatory Visit: Payer: Self-pay

## 2019-05-24 ENCOUNTER — Ambulatory Visit (INDEPENDENT_AMBULATORY_CARE_PROVIDER_SITE_OTHER): Payer: Managed Care, Other (non HMO) | Admitting: Internal Medicine

## 2019-05-24 DIAGNOSIS — R945 Abnormal results of liver function studies: Secondary | ICD-10-CM

## 2019-05-24 DIAGNOSIS — R7989 Other specified abnormal findings of blood chemistry: Secondary | ICD-10-CM

## 2019-05-24 DIAGNOSIS — Z9884 Bariatric surgery status: Secondary | ICD-10-CM

## 2019-05-24 DIAGNOSIS — E039 Hypothyroidism, unspecified: Secondary | ICD-10-CM

## 2019-05-24 DIAGNOSIS — I10 Essential (primary) hypertension: Secondary | ICD-10-CM | POA: Diagnosis not present

## 2019-05-24 DIAGNOSIS — E7800 Pure hypercholesterolemia, unspecified: Secondary | ICD-10-CM

## 2019-05-24 DIAGNOSIS — D649 Anemia, unspecified: Secondary | ICD-10-CM | POA: Diagnosis not present

## 2019-05-24 DIAGNOSIS — F320 Major depressive disorder, single episode, mild: Secondary | ICD-10-CM

## 2019-05-24 DIAGNOSIS — R739 Hyperglycemia, unspecified: Secondary | ICD-10-CM

## 2019-05-24 DIAGNOSIS — F32A Depression, unspecified: Secondary | ICD-10-CM

## 2019-05-29 ENCOUNTER — Encounter: Payer: Self-pay | Admitting: Internal Medicine

## 2019-05-29 DIAGNOSIS — R739 Hyperglycemia, unspecified: Secondary | ICD-10-CM | POA: Insufficient documentation

## 2019-05-29 NOTE — Progress Notes (Signed)
Patient ID: [name] Nielsen, female   DOB: [birth year], 43 y.o.   MRN: [id number]   Subjective:    Patient ID: [name] Nielsen, female    DOB: 04-May-1976, 43 y.o.   MRN: [id number]  HPI  Patient here for a scheduled follow up.  Recent evaluated and changed to wellbutrin.  Doing well on the wellbutrin.  Handling stress well.  No chest pain.  No sob.  No acid reflux.  No abdominal pain.  Bowels moving.  Overall she feels she is doing well.  On crestor.  Taking regularly.     Past Medical History:  Diagnosis Date  . Anemia    hx of   . Anxiety   . Depression   . Frequent headaches    hx of  . Heart murmur   . Hx: UTI (urinary tract infection)    child, s/p urinary procecure  . Hypercholesterolemia   . Hypertension   . Hypothyroidism    Past Surgical History:  Procedure Laterality Date  . CHOLECYSTECTOMY  2012  . LAPAROSCOPIC GASTRIC SLEEVE RESECTION N/A 07/05/2014   Procedure: LAPAROSCOPIC GASTRIC SLEEVE RESECTION with upper endoscopy;  Surgeon: Kaylyn Lim, MD;  Location: [facility];  Service: General;  Laterality: N/A;  . TUBAL LIGATION  1999   Family History  Problem Relation Age of Onset  . Cancer Mother        brain  . Diabetes Father   . Hypertension Father   . Kidney disease Father   . Stroke Father   . Cancer Father        prostate  . Breast cancer Neg Hx    Social History   Socioeconomic History  . Marital status: Single    Spouse name: Not on file  . Number of children: Not on file  . Years of education: Not on file  . Highest education level: Not on file  Occupational History  . Not on file  Social Needs  . Financial resource strain: Not on file  . Food insecurity    Worry: Not on file    Inability: Not on file  . Transportation needs    Medical: Not on file    Non-medical: Not on file  Tobacco Use  . Smoking status: Never Smoker  . Smokeless tobacco: Never Used  Substance and Sexual Activity  . Alcohol use: No    Alcohol/week: 0.0 standard drinks   . Drug use: No  . Sexual activity: Not on file  Lifestyle  . Physical activity    Days per week: Not on file    Minutes per session: Not on file  . Stress: Not on file  Relationships  . Social Herbalist on phone: Not on file    Gets together: Not on file    Attends religious service: Not on file    Active member of club or organization: Not on file    Attends meetings of clubs or organizations: Not on file    Relationship status: Not on file  Other Topics Concern  . Not on file  Social History Narrative  . Not on file    Outpatient Encounter Medications as of 05/24/2019  Medication Sig  . acetaminophen (TYLENOL) 500 MG tablet Take 1,000 mg by mouth every 6 (six) hours as needed for mild pain or moderate pain.  Marland Kitchen ALPRAZolam (XANAX) 0.25 MG tablet Take 1 tablet (0.25 mg total) by mouth daily as needed for anxiety.  Marland Kitchen buPROPion (WELLBUTRIN XL) 150 MG 24 hr  tablet Take 1 tablet by mouth once daily  . Cyanocobalamin (B-12 PO) Take by mouth.  . cyclobenzaprine (FLEXERIL) 10 MG tablet Take 0.5-1 tablets (5-10 mg total) by mouth 3 (three) times daily as needed for muscle spasms.  . Fe Fum-FePoly-Vit C-Vit B3 (INTEGRA) 62.5-62.5-40-3 MG CAPS Take 1 capsule by mouth once daily  . hydrochlorothiazide (MICROZIDE) 12.5 MG capsule Take 1 capsule by mouth once daily  . levothyroxine (SYNTHROID) 150 MCG tablet TAKE 1 TABLET BY MOUTH ONCE DAILY BEFORE BREAKFAST  . lisinopril (ZESTRIL) 10 MG tablet Take 1 tablet (10 mg total) by mouth daily.  . rosuvastatin (CRESTOR) 5 MG tablet TAKE 1 TABLET BY MOUTH ONCE DAILY  . sertraline (ZOLOFT) 50 MG tablet Take 1 tablet by mouth once daily  . [DISCONTINUED] phenazopyridine (PYRIDIUM) 200 MG tablet Take 1 tablet (200 mg total) by mouth 3 (three) times daily as needed for pain.   No facility-administered encounter medications on file as of 05/24/2019.     Review of Systems  Constitutional: Negative for appetite change and unexpected weight  change.  HENT: Negative for congestion and sinus pressure.   Respiratory: Negative for cough, chest tightness and shortness of breath.   Cardiovascular: Negative for chest pain, palpitations and leg swelling.  Gastrointestinal: Negative for abdominal pain, diarrhea, nausea and vomiting.  Genitourinary: Negative for difficulty urinating and dysuria.  Musculoskeletal: Negative for joint swelling and myalgias.  Skin: Negative for color change and rash.  Neurological: Negative for dizziness, light-headedness and headaches.  Psychiatric/Behavioral: Negative for agitation and dysphoric mood.       Objective:    Physical Exam Constitutional:      General: She is not in acute distress.    Appearance: Normal appearance.  HENT:     Right Ear: External ear normal.     Left Ear: External ear normal.  Eyes:     General: No scleral icterus.       Right eye: No discharge.        Left eye: No discharge.     Conjunctiva/sclera: Conjunctivae normal.  Neck:     Musculoskeletal: Neck supple. No muscular tenderness.     Thyroid: No thyromegaly.  Cardiovascular:     Rate and Rhythm: Normal rate and regular rhythm.  Pulmonary:     Effort: No respiratory distress.     Breath sounds: Normal breath sounds. No wheezing.  Abdominal:     General: Bowel sounds are normal.     Palpations: Abdomen is soft.     Tenderness: There is no abdominal tenderness.  Musculoskeletal:        General: No swelling or tenderness.  Lymphadenopathy:     Cervical: No cervical adenopathy.  Skin:    Findings: No erythema or rash.  Neurological:     Mental Status: She is alert.  Psychiatric:        Behavior: Behavior normal.     BP 108/70  Wt Readings from Last 3 Encounters:  10/29/18 249 lb 6.4 oz (113.1 kg)  07/14/18 229 lb (103.9 kg)  06/01/18 217 lb 12.8 oz (98.8 kg)     Lab Results  Component Value Date   WBC 5.9 04/20/2019   HGB 11.7 04/20/2019   HCT 36.1 04/20/2019   PLT 279 04/20/2019   GLUCOSE  87 04/20/2019   CHOL 232 (H) 04/20/2019   TRIG 96 04/20/2019   HDL 57 04/20/2019   LDLDIRECT 178.3 11/11/2013   LDLCALC 156 (H) 04/20/2019   ALT 19 04/20/2019   AST  16 04/20/2019   NA 138 04/20/2019   K 3.6 04/20/2019   CL 97 04/20/2019   CREATININE 0.97 04/20/2019   BUN 10 04/20/2019   CO2 24 04/20/2019   TSH 1.93 12/24/2017   HGBA1C 5.6 04/20/2019    Mm 3d Screen Breast Bilateral  Result Date: 05/07/2019 CLINICAL DATA:  Screening. EXAM: DIGITAL SCREENING BILATERAL MAMMOGRAM WITH TOMO AND CAD COMPARISON:  Previous exam(s). ACR Breast Density Category b: There are scattered areas of fibroglandular density. FINDINGS: There are no findings suspicious for malignancy. Images were processed with CAD. IMPRESSION: No mammographic evidence of malignancy. A result letter of this screening mammogram will be mailed directly to the patient. RECOMMENDATION: Screening mammogram in one year. (Code:SM-B-01Y) BI-RADS CATEGORY  1: Negative. Electronically Signed   By: Fidela Salisbury M.D.   On: 05/07/2019 16:10       Assessment & Plan:   Problem List Items Addressed This Visit    Abnormal liver function test    Follow liver panel.  Diet and exercise.       Anemia    Has a history of iron deficient anemia.  Follow cbc and ferritin.        Essential hypertension, benign    Blood pressure doing well.  Continue same medication regimen.  Follow pressures.  Follow metabolic panel.       Relevant Orders   CBC with Differential/Platelet   TSH   Basic metabolic panel   Hypercholesterolemia    On crestor.  Low cholesterol diet and exercise.  Follow lipid panel and liver function tests.        Relevant Orders   Hepatic function panel   Lipid panel   Hyperglycemia    Low carb diet and exercise.  Follow met b and a1c.       Relevant Orders   Hemoglobin A1c   Hypothyroidism    On thyroid replacement.  Follow tsh.        Mild depression ([place])    Now on wellbutrin.  Doing well on the  medication.  Follow.        Status post laparoscopic sleeve gastrectomy    Continue diet, exercise.  Follow.            Einar Pheasant, MD

## 2019-05-29 NOTE — Assessment & Plan Note (Signed)
On crestor.  Low cholesterol diet and exercise.  Follow lipid panel and liver function tests.   

## 2019-05-29 NOTE — Assessment & Plan Note (Signed)
Has a history of iron deficient anemia.  Follow cbc and ferritin.

## 2019-05-29 NOTE — Assessment & Plan Note (Signed)
Continue diet, exercise.  Follow.

## 2019-05-29 NOTE — Assessment & Plan Note (Signed)
On thyroid replacement.  Follow tsh.  

## 2019-05-29 NOTE — Assessment & Plan Note (Signed)
Now on wellbutrin.  Doing well on the medication.  Follow.

## 2019-05-29 NOTE — Assessment & Plan Note (Signed)
Blood pressure doing well.  Continue same medication regimen.  Follow pressures.  Follow metabolic panel.  

## 2019-05-29 NOTE — Assessment & Plan Note (Signed)
Follow liver panel.  Diet and exercise.   

## 2019-05-29 NOTE — Assessment & Plan Note (Signed)
Low carb diet and exercise.  Follow met b and a1c.  

## 2019-06-04 ENCOUNTER — Other Ambulatory Visit: Payer: Self-pay | Admitting: Internal Medicine

## 2019-06-10 ENCOUNTER — Other Ambulatory Visit: Payer: Self-pay | Admitting: Internal Medicine

## 2019-06-29 ENCOUNTER — Encounter: Payer: Self-pay | Admitting: Internal Medicine

## 2019-08-22 ENCOUNTER — Other Ambulatory Visit: Payer: Self-pay | Admitting: Internal Medicine

## 2019-09-07 ENCOUNTER — Other Ambulatory Visit: Payer: Self-pay

## 2019-09-07 MED ORDER — ROSUVASTATIN CALCIUM 5 MG PO TABS: 5.0000 mg | ORAL_TABLET | Freq: Every day | ORAL | 3 refills | Status: DC

## 2019-09-11 ENCOUNTER — Other Ambulatory Visit: Payer: Self-pay | Admitting: Internal Medicine

## 2019-09-20 ENCOUNTER — Other Ambulatory Visit (INDEPENDENT_AMBULATORY_CARE_PROVIDER_SITE_OTHER): Payer: Managed Care, Other (non HMO)

## 2019-09-20 ENCOUNTER — Other Ambulatory Visit: Payer: Self-pay

## 2019-09-20 DIAGNOSIS — I10 Essential (primary) hypertension: Secondary | ICD-10-CM

## 2019-09-20 DIAGNOSIS — R739 Hyperglycemia, unspecified: Secondary | ICD-10-CM

## 2019-09-20 DIAGNOSIS — E7800 Pure hypercholesterolemia, unspecified: Secondary | ICD-10-CM

## 2019-09-20 LAB — CBC WITH DIFFERENTIAL/PLATELET
Basophils Absolute: 0 10*3/uL (ref 0.0–0.1)
Basophils Relative: 0.9 % (ref 0.0–3.0)
Eosinophils Absolute: 0.1 10*3/uL (ref 0.0–0.7)
Eosinophils Relative: 2.1 % (ref 0.0–5.0)
HCT: 34.8 % — ABNORMAL LOW (ref 36.0–46.0)
Hemoglobin: 11.3 g/dL — ABNORMAL LOW (ref 12.0–15.0)
Lymphocytes Relative: 29.2 % (ref 12.0–46.0)
Lymphs Abs: 1.6 10*3/uL (ref 0.7–4.0)
MCHC: 32.4 g/dL (ref 30.0–36.0)
MCV: 77.4 fl — ABNORMAL LOW (ref 78.0–100.0)
Monocytes Absolute: 0.4 10*3/uL (ref 0.1–1.0)
Monocytes Relative: 6.5 % (ref 3.0–12.0)
Neutro Abs: 3.3 10*3/uL (ref 1.4–7.7)
Neutrophils Relative %: 61.3 % (ref 43.0–77.0)
Platelets: 254 10*3/uL (ref 150.0–400.0)
RBC: 4.49 Mil/uL (ref 3.87–5.11)
RDW: 14.1 % (ref 11.5–15.5)
WBC: 5.4 10*3/uL (ref 4.0–10.5)

## 2019-09-20 LAB — TSH: TSH: 2 u[IU]/mL (ref 0.35–4.50)

## 2019-09-20 LAB — BASIC METABOLIC PANEL
BUN: 10 mg/dL (ref 6–23)
CO2: 28 mEq/L (ref 19–32)
Calcium: 9.3 mg/dL (ref 8.4–10.5)
Chloride: 100 mEq/L (ref 96–112)
Creatinine, Ser: 0.93 mg/dL (ref 0.40–1.20)
GFR: 64.93 mL/min (ref >60.00)
Glucose, Bld: 93 mg/dL (ref 70–99)
Potassium: 3.4 mEq/L — ABNORMAL LOW (ref 3.5–5.1)
Sodium: 137 mEq/L (ref 135–145)

## 2019-09-20 LAB — HEPATIC FUNCTION PANEL
ALT: 14 U/L (ref 0–35)
AST: 15 U/L (ref 0–37)
Albumin: 4.5 g/dL (ref 3.5–5.2)
Alkaline Phosphatase: 33 U/L — ABNORMAL LOW (ref 39–117)
Bilirubin, Direct: 0.1 mg/dL (ref 0.0–0.3)
Total Bilirubin: 0.5 mg/dL (ref 0.2–1.2)
Total Protein: 7.1 g/dL (ref 6.0–8.3)

## 2019-09-20 LAB — LIPID PANEL
Cholesterol: 224 mg/dL — ABNORMAL HIGH (ref 0–200)
HDL: 59.6 mg/dL (ref >39.00)
LDL Cholesterol: 148 mg/dL — ABNORMAL HIGH (ref 0–99)
NonHDL: 164.7
Total CHOL/HDL Ratio: 4
Triglycerides: 82 mg/dL (ref 0.0–149.0)
VLDL: 16.4 mg/dL (ref 0.0–40.0)

## 2019-09-20 LAB — HEMOGLOBIN A1C: Hgb A1c MFr Bld: 5.7 % (ref 4.6–6.5)

## 2019-09-23 ENCOUNTER — Other Ambulatory Visit: Payer: Self-pay

## 2019-09-23 ENCOUNTER — Encounter: Payer: Self-pay | Admitting: Internal Medicine

## 2019-09-23 ENCOUNTER — Ambulatory Visit (INDEPENDENT_AMBULATORY_CARE_PROVIDER_SITE_OTHER): Payer: Managed Care, Other (non HMO) | Admitting: Internal Medicine

## 2019-09-23 DIAGNOSIS — I10 Essential (primary) hypertension: Secondary | ICD-10-CM

## 2019-09-23 DIAGNOSIS — R945 Abnormal results of liver function studies: Secondary | ICD-10-CM | POA: Diagnosis not present

## 2019-09-23 DIAGNOSIS — R7989 Other specified abnormal findings of blood chemistry: Secondary | ICD-10-CM

## 2019-09-23 DIAGNOSIS — E876 Hypokalemia: Secondary | ICD-10-CM

## 2019-09-23 DIAGNOSIS — R739 Hyperglycemia, unspecified: Secondary | ICD-10-CM

## 2019-09-23 DIAGNOSIS — D649 Anemia, unspecified: Secondary | ICD-10-CM | POA: Diagnosis not present

## 2019-09-23 DIAGNOSIS — E7800 Pure hypercholesterolemia, unspecified: Secondary | ICD-10-CM | POA: Diagnosis not present

## 2019-09-23 DIAGNOSIS — F320 Major depressive disorder, single episode, mild: Secondary | ICD-10-CM

## 2019-09-23 DIAGNOSIS — E039 Hypothyroidism, unspecified: Secondary | ICD-10-CM

## 2019-09-23 DIAGNOSIS — F32A Depression, unspecified: Secondary | ICD-10-CM

## 2019-09-23 MED ORDER — POTASSIUM CHLORIDE ER 10 MEQ PO TBCR: 10.0000 meq | EXTENDED_RELEASE_TABLET | Freq: Every day | ORAL | 0 refills | Status: DC

## 2019-09-23 NOTE — Progress Notes (Signed)
Patient ID: [name] Nielsen, female   DOB: 1976-05-17, 43 y.o.   MRN: [id number]   Virtual Visit via video Note  This visit type was conducted due to national recommendations for restrictions regarding the COVID-19 pandemic (e.g. social distancing).  This format is felt to be most appropriate for this patient at this time.  All issues noted in this document were discussed and addressed.  No physical exam was performed (except for noted visual exam findings with Video Visits).   I connected with [name] Nielsen by a video enabled telemedicine application and verified that I am speaking with the correct person using two identifiers. Location patient: home Location provider: work Persons participating in the virtual visit: patient, provider  I discussed the limitations, risks, security and privacy concerns of performing an evaluation and management service by video and the availability of in person appointments.  The patient expressed understanding and agreed to proceed.   Reason for visit: scheduled follow up.   HPI: She reports she is doing well.  Handling stress.  On wellbutrin.  Feels she is doing well on wellbutrin.  Recent hgb 11.3.  Is iron deficient.  Does have heavy periods.  Age 43 now.  Due screening colonoscopy.  Agreeable for referral.  Discussed labs.  Liver function tests wnl.  Potassium slightly decreased.  On triam/hctz.  Has upcoming surgery - plastic surgery.  Will have her take kcl supplements.  Recheck potassium level prior to surgery.  Blood pressure doing well.  Occasional light headedness.  No headache reported.  No chest pain or sob reported.  No abdominal pain reported.  Bowels moving.     ROS: See pertinent positives and negatives per HPI.  Past Medical History:  Diagnosis Date  . Anemia    hx of   . Anxiety   . Depression   . Frequent headaches    hx of  . Heart murmur   . Hx: UTI (urinary tract infection)    child, s/p urinary procecure  .  Hypercholesterolemia   . Hypertension   . Hypothyroidism     Past Surgical History:  Procedure Laterality Date  . CHOLECYSTECTOMY  2012  . LAPAROSCOPIC GASTRIC SLEEVE RESECTION N/A 07/05/2014   Procedure: LAPAROSCOPIC GASTRIC SLEEVE RESECTION with upper endoscopy;  Surgeon: Kaylyn Lim, MD;  Location: [facility];  Service: General;  Laterality: N/A;  . TUBAL LIGATION  1999    Family History  Problem Relation Age of Onset  . Cancer Mother        brain  . Diabetes Father   . Hypertension Father   . Kidney disease Father   . Stroke Father   . Cancer Father        prostate  . Breast cancer Neg Hx     SOCIAL HX: reviewed.    Current Outpatient Medications:  .  acetaminophen (TYLENOL) 500 MG tablet, Take 1,000 mg by mouth every 6 (six) hours as needed for mild pain or moderate pain., Disp: , Rfl:  .  ALPRAZolam (XANAX) 0.25 MG tablet, Take 1 tablet (0.25 mg total) by mouth daily as needed for anxiety., Disp: 30 tablet, Rfl: 0 .  buPROPion (WELLBUTRIN XL) 150 MG 24 hr tablet, Take 1 tablet by mouth once daily, Disp: 90 tablet, Rfl: 1 .  Cyanocobalamin (B-12 PO), Take by mouth., Disp: , Rfl:  .  Fe Fum-FePoly-Vit C-Vit B3 (INTEGRA) 62.5-62.5-40-3 MG CAPS, Take 1 capsule by mouth once daily, Disp: 30 capsule, Rfl: 5 .  hydrochlorothiazide (MICROZIDE) 12.5 MG capsule,  Take 1 capsule by mouth once daily, Disp: 90 capsule, Rfl: 0 .  levothyroxine (SYNTHROID) 150 MCG tablet, TAKE 1 TABLET BY MOUTH ONCE DAILY BEFORE BREAKFAST, Disp: 90 tablet, Rfl: 1 .  lisinopril (ZESTRIL) 10 MG tablet, Take 1 tablet by mouth once daily, Disp: 90 tablet, Rfl: 0 .  rosuvastatin (CRESTOR) 5 MG tablet, Take 1 tablet (5 mg total) by mouth daily., Disp: 90 tablet, Rfl: 3 .  cyclobenzaprine (FLEXERIL) 10 MG tablet, Take 0.5-1 tablets (5-10 mg total) by mouth 3 (three) times daily as needed for muscle spasms. (Patient not taking: Reported on 09/23/2019), Disp: 30 tablet, Rfl: 0 .  potassium chloride (KLOR-CON) 10 MEQ  tablet, Take 1 tablet (10 mEq total) by mouth daily., Disp: 14 tablet, Rfl: 0 .  sertraline (ZOLOFT) 50 MG tablet, Take 1 tablet by mouth once daily (Patient not taking: Reported on 09/23/2019), Disp: 30 tablet, Rfl: 2  EXAM:  GENERAL: alert, oriented, appears well and in no acute distress  HEENT: atraumatic, conjunttiva clear, no obvious abnormalities on inspection of external nose and ears  NECK: normal movements of the head and neck  LUNGS: on inspection no signs of respiratory distress, breathing rate appears normal, no obvious gross SOB, gasping or wheezing  CV: no obvious cyanosis  PSYCH/NEURO: pleasant and cooperative, no obvious depression or anxiety, speech and thought processing grossly intact  ASSESSMENT AND PLAN:  Discussed the following assessment and plan:  Abnormal liver function test Recent liver panel wnl.    Anemia Has a history of iron deficient anemia.  On iron supplements.  Refer to GI for screening colonoscopy.   Essential hypertension, benign Blood pressure under good control.  Continue same medication regimen.  Follow pressures.  Follow metabolic panel.  Will hold blood pressure medication day of surgery.  Follow.    Hypercholesterolemia Crestor.  Low cholesterol diet and exercise.  Follow lipid panel and liver function tests.    Hyperglycemia Low carb diet and exercise.  Follow met b and a1c.    Hypothyroidism On thyroid replacement.  Follow tsh.    Mild depression ([place]) On wellbutrin.  Doing well.    Hypokalemia Start potassium supplements prior to surgery.  Recheck potassium prior to surgery.     I discussed the assessment and treatment plan with the patient. The patient was provided an opportunity to ask questions and all were answered. The patient agreed with the plan and demonstrated an understanding of the instructions.   The patient was advised to call back or seek an in-person evaluation if the symptoms worsen or if the condition fails  to improve as anticipated.   Einar Pheasant, MD

## 2019-09-25 ENCOUNTER — Telehealth: Payer: Self-pay | Admitting: *Deleted

## 2019-09-25 NOTE — Telephone Encounter (Signed)
Please place future orders for lab appt.   Note: Pt usually needs labs ordered for [facility]

## 2019-09-26 ENCOUNTER — Encounter: Payer: Self-pay | Admitting: Internal Medicine

## 2019-09-26 DIAGNOSIS — E876 Hypokalemia: Secondary | ICD-10-CM | POA: Insufficient documentation

## 2019-09-26 NOTE — Assessment & Plan Note (Signed)
Low carb diet and exercise.  Follow met b and a1c.   

## 2019-09-26 NOTE — Assessment & Plan Note (Signed)
On thyroid replacement.  Follow tsh.  

## 2019-09-26 NOTE — Assessment & Plan Note (Signed)
Crestor. Low cholesterol diet and exercise.  Follow lipid panel and liver function tests.  

## 2019-09-26 NOTE — Assessment & Plan Note (Signed)
Has a history of iron deficient anemia.  On iron supplements.  Refer to GI for screening colonoscopy.

## 2019-09-26 NOTE — Telephone Encounter (Signed)
Order placed for f/u potassium check.  

## 2019-09-26 NOTE — Assessment & Plan Note (Signed)
Start potassium supplements prior to surgery.  Recheck potassium prior to surgery.

## 2019-09-26 NOTE — Assessment & Plan Note (Signed)
Blood pressure under good control.  Continue same medication regimen.  Follow pressures.  Follow metabolic panel.  Will hold blood pressure medication day of surgery.  Follow.

## 2019-09-26 NOTE — Assessment & Plan Note (Signed)
Recent liver panel wnl.  

## 2019-09-26 NOTE — Assessment & Plan Note (Signed)
On wellbutrin.  Doing well.

## 2019-09-27 ENCOUNTER — Other Ambulatory Visit: Payer: Self-pay

## 2019-09-27 ENCOUNTER — Encounter: Payer: Self-pay | Admitting: Internal Medicine

## 2019-09-27 ENCOUNTER — Other Ambulatory Visit (INDEPENDENT_AMBULATORY_CARE_PROVIDER_SITE_OTHER): Payer: Managed Care, Other (non HMO)

## 2019-09-27 ENCOUNTER — Other Ambulatory Visit: Payer: Self-pay | Admitting: Internal Medicine

## 2019-09-27 DIAGNOSIS — E876 Hypokalemia: Secondary | ICD-10-CM

## 2019-09-27 LAB — POTASSIUM: Potassium: 3.8 mEq/L (ref 3.5–5.1)

## 2019-09-27 NOTE — Progress Notes (Signed)
Order placed for f/u potassium.  

## 2019-10-11 ENCOUNTER — Other Ambulatory Visit (INDEPENDENT_AMBULATORY_CARE_PROVIDER_SITE_OTHER): Payer: Managed Care, Other (non HMO)

## 2019-10-11 ENCOUNTER — Other Ambulatory Visit: Payer: Self-pay

## 2019-10-11 DIAGNOSIS — E876 Hypokalemia: Secondary | ICD-10-CM | POA: Diagnosis not present

## 2019-10-11 NOTE — Addendum Note (Signed)
Addended by: Warden Fillers on: 10/11/2019 02:03 PM   Modules accepted: Orders

## 2019-10-12 ENCOUNTER — Other Ambulatory Visit: Payer: Self-pay | Admitting: Internal Medicine

## 2019-10-12 LAB — POTASSIUM: Potassium: 4.1 mmol/L (ref 3.5–5.2)

## 2019-10-12 MED ORDER — LISINOPRIL 10 MG PO TABS: 10.0000 mg | ORAL_TABLET | Freq: Every day | ORAL | 0 refills | Status: DC

## 2019-10-12 MED ORDER — HYDROCHLOROTHIAZIDE 12.5 MG PO CAPS: 12.5000 mg | ORAL_CAPSULE | Freq: Every day | ORAL | 0 refills | Status: DC

## 2019-10-13 ENCOUNTER — Encounter: Payer: Self-pay | Admitting: Internal Medicine

## 2019-11-08 DIAGNOSIS — U071 COVID-19: Secondary | ICD-10-CM

## 2019-11-08 HISTORY — DX: COVID-19: U07.1

## 2019-11-10 ENCOUNTER — Other Ambulatory Visit: Payer: Self-pay | Admitting: Internal Medicine

## 2019-11-15 ENCOUNTER — Other Ambulatory Visit: Payer: Self-pay | Admitting: Internal Medicine

## 2019-11-27 LAB — NOVEL CORONAVIRUS, NAA: SARS-CoV-2, NAA: POSITIVE

## 2019-11-28 ENCOUNTER — Encounter: Payer: Self-pay | Admitting: Internal Medicine

## 2019-12-06 ENCOUNTER — Other Ambulatory Visit: Payer: Self-pay | Admitting: Internal Medicine

## 2019-12-26 ENCOUNTER — Other Ambulatory Visit: Payer: Self-pay | Admitting: Internal Medicine

## 2020-01-24 ENCOUNTER — Other Ambulatory Visit: Payer: Self-pay | Admitting: Internal Medicine

## 2020-01-27 ENCOUNTER — Other Ambulatory Visit: Payer: Self-pay | Admitting: Internal Medicine

## 2020-01-27 NOTE — Telephone Encounter (Signed)
Last OV 09/23/19 ok to fill ?

## 2020-01-28 MED ORDER — ALPRAZOLAM 0.25 MG PO TABS: 0.2500 mg | ORAL_TABLET | Freq: Every day | ORAL | 0 refills | Status: DC | PRN

## 2020-01-28 NOTE — Telephone Encounter (Signed)
Received a request for refill xanax.  I sent in refill.  Please confirm pt doing ok.

## 2020-02-01 NOTE — Telephone Encounter (Signed)
LMTCB

## 2020-02-07 ENCOUNTER — Encounter: Payer: Managed Care, Other (non HMO) | Admitting: Internal Medicine

## 2020-02-19 ENCOUNTER — Other Ambulatory Visit: Payer: Self-pay | Admitting: Internal Medicine

## 2020-02-23 ENCOUNTER — Ambulatory Visit (INDEPENDENT_AMBULATORY_CARE_PROVIDER_SITE_OTHER): Payer: Managed Care, Other (non HMO) | Admitting: Internal Medicine

## 2020-02-23 ENCOUNTER — Other Ambulatory Visit: Payer: Self-pay

## 2020-02-23 VITALS — BP 120/78 | HR 78 | Temp 98.0°F | Resp 16 | Ht 67.0 in | Wt 203.0 lb

## 2020-02-23 DIAGNOSIS — D649 Anemia, unspecified: Secondary | ICD-10-CM

## 2020-02-23 DIAGNOSIS — R739 Hyperglycemia, unspecified: Secondary | ICD-10-CM | POA: Diagnosis not present

## 2020-02-23 DIAGNOSIS — H5789 Other specified disorders of eye and adnexa: Secondary | ICD-10-CM

## 2020-02-23 DIAGNOSIS — Z0000 Encounter for general adult medical examination without abnormal findings: Secondary | ICD-10-CM | POA: Diagnosis not present

## 2020-02-23 DIAGNOSIS — E039 Hypothyroidism, unspecified: Secondary | ICD-10-CM

## 2020-02-23 DIAGNOSIS — E7800 Pure hypercholesterolemia, unspecified: Secondary | ICD-10-CM

## 2020-02-23 DIAGNOSIS — I10 Essential (primary) hypertension: Secondary | ICD-10-CM

## 2020-02-23 DIAGNOSIS — Z1211 Encounter for screening for malignant neoplasm of colon: Secondary | ICD-10-CM

## 2020-02-23 DIAGNOSIS — F32A Depression, unspecified: Secondary | ICD-10-CM

## 2020-02-23 DIAGNOSIS — F320 Major depressive disorder, single episode, mild: Secondary | ICD-10-CM

## 2020-02-23 NOTE — Assessment & Plan Note (Addendum)
Physical today 02/23/20.  Mammogram 05/07/19 - Bir[facility] I.  PAP 06/2016 - negative with negative HPV.  Unable to do pap today secondary to menstrual period.  Will do at next visit.  Due colonoscopy.

## 2020-02-23 NOTE — Progress Notes (Signed)
Patient ID: [name] Nielsen, female   DOB: [birth year], 44 y.o.   MRN: [id number]   Subjective:    Patient ID: [name] Nielsen, female    DOB: [birth year], 44 y.o.   MRN: [id number]  HPI This visit occurred during the SARS-CoV-2 public health emergency.  Safety protocols were in place, including screening questions prior to the visit, additional usage of staff PPE, and extensive cleaning of exam room while observing appropriate contact time as indicated for disinfecting solutions.  Patient here for her physical exam.  She reports she is doing relatively well.  Handling stress.  Better.  Is having her period today.  Unable to do pap.  Tries to stay active.  No chest pain or sob reported.  Has noticed some intermittent eye swelling.  Bilateral - eyes.  Takes benadryl.  No known triggers.  No food allergies.  No lip or tongue swelling.  No sob.  No acid reflux.  No abdominal pain.  Bowels moving.  On wellbutrin.  Discussed colon referral.    Past Medical History:  Diagnosis Date  . Anemia    hx of   . Anxiety   . Depression   . Frequent headaches    hx of  . Heart murmur   . Hx: UTI (urinary tract infection)    child, s/p urinary procecure  . Hypercholesterolemia   . Hypertension   . Hypothyroidism    Past Surgical History:  Procedure Laterality Date  . CHOLECYSTECTOMY  2012  . LAPAROSCOPIC GASTRIC SLEEVE RESECTION N/A 07/05/2014   Procedure: LAPAROSCOPIC GASTRIC SLEEVE RESECTION with upper endoscopy;  Surgeon: Kaylyn Lim, MD;  Location: [facility];  Service: General;  Laterality: N/A;  . TUBAL LIGATION  1999   Family History  Problem Relation Age of Onset  . Cancer Mother        brain  . Diabetes Father   . Hypertension Father   . Kidney disease Father   . Stroke Father   . Cancer Father        prostate  . Breast cancer Neg Hx    Social History   Socioeconomic History  . Marital status: Single    Spouse name: Not on file  . Number of children: Not on file  . Years of  education: Not on file  . Highest education level: Not on file  Occupational History  . Not on file  Tobacco Use  . Smoking status: Never Smoker  . Smokeless tobacco: Never Used  Substance and Sexual Activity  . Alcohol use: No    Alcohol/week: 0.0 standard drinks  . Drug use: No  . Sexual activity: Not on file  Other Topics Concern  . Not on file  Social History Narrative  . Not on file   Social Determinants of Health   Financial Resource Strain:   . Difficulty of Paying Living Expenses:   Food Insecurity:   . Worried About Charity fundraiser in the Last Year:   . Arboriculturist in the Last Year:   Transportation Needs:   . Film/video editor (Medical):   Marland Kitchen Lack of Transportation (Non-Medical):   Physical Activity:   . Days of Exercise per Week:   . Minutes of Exercise per Session:   Stress:   . Feeling of Stress :   Social Connections:   . Frequency of Communication with Friends and Family:   . Frequency of Social Gatherings with Friends and Family:   . Attends Religious Services:   .  Active Member of Clubs or Organizations:   . Attends Archivist Meetings:   Marland Kitchen Marital Status:     Outpatient Encounter Medications as of 02/23/2020  Medication Sig  . acetaminophen (TYLENOL) 500 MG tablet Take 1,000 mg by mouth every 6 (six) hours as needed for mild pain or moderate pain.  Marland Kitchen ALPRAZolam (XANAX) 0.25 MG tablet Take 1 tablet (0.25 mg total) by mouth daily as needed for anxiety.  Marland Kitchen buPROPion (WELLBUTRIN XL) 150 MG 24 hr tablet Take 1 tablet by mouth once daily  . Cyanocobalamin (B-12 PO) Take by mouth.  . cyclobenzaprine (FLEXERIL) 10 MG tablet Take 0.5-1 tablets (5-10 mg total) by mouth 3 (three) times daily as needed for muscle spasms. (Patient not taking: Reported on 09/23/2019)  . EUTHYROX 150 MCG tablet TAKE 1 TABLET BY MOUTH ONCE DAILY BEFORE BREAKFAST  . Fe Fum-FePoly-Vit C-Vit B3 (INTEGRA) 62.5-62.5-40-3 MG CAPS Take 1 capsule by mouth once daily  .  hydrochlorothiazide (MICROZIDE) 12.5 MG capsule Take 1 capsule by mouth once daily  . lisinopril (ZESTRIL) 10 MG tablet Take 1 tablet by mouth once daily  . potassium chloride (KLOR-CON) 10 MEQ tablet Take 1 tablet (10 mEq total) by mouth daily.  . rosuvastatin (CRESTOR) 5 MG tablet Take 1 tablet (5 mg total) by mouth daily.  . sertraline (ZOLOFT) 50 MG tablet Take 1 tablet by mouth once daily (Patient not taking: Reported on 09/23/2019)   No facility-administered encounter medications on file as of 02/23/2020.    Review of Systems  Constitutional: Negative for appetite change and unexpected weight change.  HENT: Negative for congestion and sinus pressure.   Eyes: Negative for pain and visual disturbance.  Respiratory: Negative for cough, chest tightness and shortness of breath.   Cardiovascular: Negative for chest pain, palpitations and leg swelling.  Gastrointestinal: Negative for abdominal pain, diarrhea, nausea and vomiting.  Genitourinary: Negative for difficulty urinating and dysuria.  Musculoskeletal: Negative for joint swelling and myalgias.  Skin: Negative for color change and rash.  Neurological: Negative for dizziness, light-headedness and headaches.  Hematological: Negative for adenopathy. Does not bruise/bleed easily.  Psychiatric/Behavioral: Negative for decreased concentration and dysphoric mood.       Objective:    Physical Exam Vitals reviewed.  Constitutional:      General: She is not in acute distress.    Appearance: Normal appearance. She is well-developed.  HENT:     Head: Normocephalic and atraumatic.     Right Ear: External ear normal.     Left Ear: External ear normal.  Eyes:     General: No scleral icterus.       Right eye: No discharge.        Left eye: No discharge.  Neck:     Thyroid: No thyromegaly.  Cardiovascular:     Rate and Rhythm: Normal rate and regular rhythm.  Pulmonary:     Effort: No tachypnea, accessory muscle usage or respiratory  distress.     Breath sounds: Normal breath sounds. No decreased breath sounds or wheezing.  Chest:     Breasts:        Right: No inverted nipple, mass, nipple discharge or tenderness (no axillary adenopathy).        Left: No inverted nipple, mass, nipple discharge or tenderness (no axilarry adenopathy).  Abdominal:     General: Bowel sounds are normal.     Palpations: Abdomen is soft.     Tenderness: There is no abdominal tenderness.  Musculoskeletal:  General: No swelling or tenderness.     Cervical back: Neck supple. No tenderness.  Lymphadenopathy:     Cervical: No cervical adenopathy.  Skin:    Findings: No erythema or rash.  Neurological:     Mental Status: She is alert and oriented to person, place, and time.  Psychiatric:        Mood and Affect: Mood normal.        Behavior: Behavior normal.     BP 120/78   Pulse 78   Temp 98 F (36.7 C)   Resp 16   Ht '5\' 7"'  (1.702 m)   Wt 203 lb (92.1 kg)   SpO2 98%   BMI 31.79 kg/m  Wt Readings from Last 3 Encounters:  02/23/20 203 lb (92.1 kg)  09/23/19 204 lb 9.6 oz (92.8 kg)  10/29/18 249 lb 6.4 oz (113.1 kg)     Lab Results  Component Value Date   WBC 6.3 02/23/2020   HGB 12.7 02/23/2020   HCT 38.7 02/23/2020   PLT 293 02/23/2020   GLUCOSE 86 02/23/2020   CHOL 208 (H) 02/23/2020   TRIG 90 02/23/2020   HDL 75 02/23/2020   LDLDIRECT 178.3 11/11/2013   LDLCALC 117 (H) 02/23/2020   ALT 18 02/23/2020   AST 19 02/23/2020   NA 139 02/23/2020   K 3.8 02/23/2020   CL 100 02/23/2020   CREATININE 0.99 02/23/2020   BUN 8 02/23/2020   CO2 23 02/23/2020   TSH 2.00 09/20/2019   HGBA1C 5.8 (H) 02/23/2020    MM 3D SCREEN BREAST BILATERAL  Result Date: 05/07/2019 CLINICAL DATA:  Screening. EXAM: DIGITAL SCREENING BILATERAL MAMMOGRAM WITH TOMO AND CAD COMPARISON:  Previous exam(s). ACR Breast Density Category b: There are scattered areas of fibroglandular density. FINDINGS: There are no findings suspicious for  malignancy. Images were processed with CAD. IMPRESSION: No mammographic evidence of malignancy. A result letter of this screening mammogram will be mailed directly to the patient. RECOMMENDATION: Screening mammogram in one year. (Code:SM-B-01Y) BI-RADS CATEGORY  1: Negative. Electronically Signed   By: Fidela Salisbury M.D.   On: 05/07/2019 16:10       Assessment & Plan:   Problem List Items Addressed This Visit    Anemia    Follow cbc.       Relevant Orders   CBC with Differential/Platelet (Completed)   Ferritin (Completed)   Iron and TIBC (Completed)   Colon cancer screening    Refer to GI for colonoscopy.       Relevant Orders   Ambulatory referral to Gastroenterology   Essential hypertension, benign    Blood pressure doing well.  On lisinopril and hctz.  Follow pressures.  Follow metabolic panel.       Eye swelling    Intermittent eye swelling.  Allergy - unclear trigger.  Given persistent intermittent swelling - to allergist for further evaluation.        Relevant Orders   Ambulatory referral to Allergy   Health care maintenance    Physical today 02/23/20.  Mammogram 05/07/19 - Birads I.  PAP 06/2016 - negative with negative HPV.  Unable to do pap today secondary to menstrual period.  Will do at next visit.  Due colonoscopy.        Hypercholesterolemia    On crestor.  Low cholesterol diet and exercise.  Follow lipid panel and liver function tests.        Relevant Orders   Basic metabolic panel (Completed)   Hepatic function  panel (Completed)   Lipid panel (Completed)   Hyperglycemia    Low carb diet and exercise.  Follow met b and a1c.       Relevant Orders   Hemoglobin A1c (Completed)   Hypothyroidism    On thyroid replacement.  Follow tsh.        Mild depression ([place])    Doing well.  On wellbutrin.  Follow.         Other Visit Diagnoses    Routine general medical examination at a health care facility    -  Primary       Einar Pheasant, MD

## 2020-02-24 LAB — CBC WITH DIFFERENTIAL/PLATELET
Basophils Absolute: 0 10*3/uL (ref 0.0–0.2)
Basos: 1 %
EOS (ABSOLUTE): 0.1 10*3/uL (ref 0.0–0.4)
Eos: 1 %
Hematocrit: 38.7 % (ref 34.0–46.6)
Hemoglobin: 12.7 g/dL (ref 11.1–15.9)
Immature Grans (Abs): 0 10*3/uL (ref 0.0–0.1)
Immature Granulocytes: 0 %
Lymphocytes Absolute: 1.8 10*3/uL (ref 0.7–3.1)
Lymphs: 29 %
MCH: 25 pg — ABNORMAL LOW (ref 26.6–33.0)
MCHC: 32.8 g/dL (ref 31.5–35.7)
MCV: 76 fL — ABNORMAL LOW (ref 79–97)
Monocytes Absolute: 0.5 10*3/uL (ref 0.1–0.9)
Monocytes: 7 %
Neutrophils Absolute: 3.9 10*3/uL (ref 1.4–7.0)
Neutrophils: 62 %
Platelets: 293 10*3/uL (ref 150–450)
RBC: 5.09 x10E6/uL (ref 3.77–5.28)
RDW: 13.2 % (ref 11.7–15.4)
WBC: 6.3 10*3/uL (ref 3.4–10.8)

## 2020-02-24 LAB — LIPID PANEL
Chol/HDL Ratio: 2.8 ratio (ref 0.0–4.4)
Cholesterol, Total: 208 mg/dL — ABNORMAL HIGH (ref 100–199)
HDL: 75 mg/dL (ref >39)
LDL Chol Calc (NIH): 117 mg/dL — ABNORMAL HIGH (ref 0–99)
Triglycerides: 90 mg/dL (ref 0–149)
VLDL Cholesterol Cal: 16 mg/dL (ref 5–40)

## 2020-02-24 LAB — HEPATIC FUNCTION PANEL
ALT: 18 IU/L (ref 0–32)
AST: 19 IU/L (ref 0–40)
Albumin: 4.6 g/dL (ref 3.8–4.8)
Alkaline Phosphatase: 44 IU/L — ABNORMAL LOW (ref 48–121)
Bilirubin Total: 0.3 mg/dL (ref 0.0–1.2)
Bilirubin, Direct: 0.09 mg/dL (ref 0.00–0.40)
Total Protein: 7.5 g/dL (ref 6.0–8.5)

## 2020-02-24 LAB — BASIC METABOLIC PANEL
BUN/Creatinine Ratio: 8 — ABNORMAL LOW (ref 9–23)
BUN: 8 mg/dL (ref 6–24)
CO2: 23 mmol/L (ref 20–29)
Calcium: 9.7 mg/dL (ref 8.7–10.2)
Chloride: 100 mmol/L (ref 96–106)
Creatinine, Ser: 0.99 mg/dL (ref 0.57–1.00)
GFR calc Af Amer: 79 mL/min/{1.73_m2} (ref >59)
GFR calc non Af Amer: 69 mL/min/{1.73_m2} (ref >59)
Glucose: 86 mg/dL (ref 65–99)
Potassium: 3.8 mmol/L (ref 3.5–5.2)
Sodium: 139 mmol/L (ref 134–144)

## 2020-02-24 LAB — IRON AND TIBC
Iron Saturation: 58 % — ABNORMAL HIGH (ref 15–55)
Iron: 165 ug/dL — ABNORMAL HIGH (ref 27–159)
Total Iron Binding Capacity: 283 ug/dL (ref 250–450)
UIBC: 118 ug/dL — ABNORMAL LOW (ref 131–425)

## 2020-02-24 LAB — HEMOGLOBIN A1C
Est. average glucose Bld gHb Est-mCnc: 120 mg/dL
Hgb A1c MFr Bld: 5.8 % — ABNORMAL HIGH (ref 4.8–5.6)

## 2020-02-24 LAB — FERRITIN: Ferritin: 79 ng/mL (ref 15–150)

## 2020-02-26 ENCOUNTER — Encounter: Payer: Self-pay | Admitting: Internal Medicine

## 2020-02-26 ENCOUNTER — Telehealth: Payer: Self-pay | Admitting: Internal Medicine

## 2020-02-26 DIAGNOSIS — Z20822 Contact with and (suspected) exposure to covid-19: Secondary | ICD-10-CM

## 2020-02-26 DIAGNOSIS — Z8639 Personal history of other endocrine, nutritional and metabolic disease: Secondary | ICD-10-CM

## 2020-02-26 NOTE — Telephone Encounter (Signed)
Opened in error

## 2020-02-29 ENCOUNTER — Telehealth: Payer: Self-pay | Admitting: Internal Medicine

## 2020-02-29 NOTE — Telephone Encounter (Signed)
Orders placed for [facility] labs to be drawn at our clinic.

## 2020-02-29 NOTE — Telephone Encounter (Signed)
Called and scheduled pt

## 2020-02-29 NOTE — Telephone Encounter (Signed)
Please schedule non fasting lab appt in the next 10-14 days.  Thank you.

## 2020-03-06 ENCOUNTER — Encounter: Payer: Self-pay | Admitting: Internal Medicine

## 2020-03-06 ENCOUNTER — Other Ambulatory Visit: Payer: Self-pay | Admitting: Internal Medicine

## 2020-03-06 DIAGNOSIS — Z1211 Encounter for screening for malignant neoplasm of colon: Secondary | ICD-10-CM | POA: Insufficient documentation

## 2020-03-06 DIAGNOSIS — H5789 Other specified disorders of eye and adnexa: Secondary | ICD-10-CM | POA: Insufficient documentation

## 2020-03-06 NOTE — Assessment & Plan Note (Signed)
Blood pressure doing well.  On lisinopril and hctz.  Follow pressures.  Follow metabolic panel.  

## 2020-03-06 NOTE — Assessment & Plan Note (Signed)
On thyroid replacement.  Follow tsh.  

## 2020-03-06 NOTE — Assessment & Plan Note (Signed)
Intermittent eye swelling.  Allergy - unclear trigger.  Given persistent intermittent swelling - to allergist for further evaluation.

## 2020-03-06 NOTE — Assessment & Plan Note (Signed)
Refer to GI for colonoscopy.

## 2020-03-06 NOTE — Assessment & Plan Note (Signed)
Follow cbc.  

## 2020-03-06 NOTE — Assessment & Plan Note (Signed)
Doing well.  On wellbutrin.  Follow.  

## 2020-03-06 NOTE — Assessment & Plan Note (Signed)
Low carb diet and exercise.  Follow met b and a1c.  

## 2020-03-06 NOTE — Assessment & Plan Note (Signed)
On crestor.  Low cholesterol diet and exercise.  Follow lipid panel and liver function tests.   

## 2020-03-10 ENCOUNTER — Other Ambulatory Visit: Payer: Self-pay

## 2020-03-10 ENCOUNTER — Other Ambulatory Visit (INDEPENDENT_AMBULATORY_CARE_PROVIDER_SITE_OTHER): Payer: Managed Care, Other (non HMO)

## 2020-03-10 DIAGNOSIS — Z8639 Personal history of other endocrine, nutritional and metabolic disease: Secondary | ICD-10-CM | POA: Diagnosis not present

## 2020-03-10 DIAGNOSIS — Z20822 Contact with and (suspected) exposure to covid-19: Secondary | ICD-10-CM

## 2020-03-11 LAB — IRON AND TIBC
Iron Saturation: 22 % (ref 15–55)
Iron: 66 ug/dL (ref 27–159)
Total Iron Binding Capacity: 301 ug/dL (ref 250–450)
UIBC: 235 ug/dL (ref 131–425)

## 2020-03-11 LAB — VITAMIN D 25 HYDROXY (VIT D DEFICIENCY, FRACTURES): Vit D, 25-Hydroxy: 57.2 ng/mL (ref 30.0–100.0)

## 2020-03-11 LAB — SARS-COV-2 ANTIBODIES: SARS-CoV-2 Antibodies: POSITIVE

## 2020-03-11 LAB — FERRITIN: Ferritin: 57 ng/mL (ref 15–150)

## 2020-03-13 ENCOUNTER — Encounter: Payer: Self-pay | Admitting: Internal Medicine

## 2020-03-22 ENCOUNTER — Other Ambulatory Visit: Payer: Self-pay | Admitting: Internal Medicine

## 2020-03-23 ENCOUNTER — Other Ambulatory Visit: Payer: Self-pay | Admitting: Internal Medicine

## 2020-03-23 DIAGNOSIS — Z1231 Encounter for screening mammogram for malignant neoplasm of breast: Secondary | ICD-10-CM

## 2020-05-04 ENCOUNTER — Other Ambulatory Visit: Payer: Self-pay | Admitting: Internal Medicine

## 2020-05-08 ENCOUNTER — Encounter: Payer: Self-pay | Admitting: Internal Medicine

## 2020-05-08 DIAGNOSIS — R739 Hyperglycemia, unspecified: Secondary | ICD-10-CM

## 2020-05-08 DIAGNOSIS — Z6831 Body mass index (BMI) 31.0-31.9, adult: Secondary | ICD-10-CM

## 2020-05-09 ENCOUNTER — Ambulatory Visit
Admission: RE | Admit: 2020-05-09 | Discharge: 2020-05-09 | Disposition: A | Discharge status: Home / Self Care | Payer: Managed Care, Other (non HMO) | Source: Ambulatory Visit | Attending: Internal Medicine | Admitting: Internal Medicine

## 2020-05-09 ENCOUNTER — Other Ambulatory Visit: Payer: Self-pay

## 2020-05-09 DIAGNOSIS — Z1231 Encounter for screening mammogram for malignant neoplasm of breast: Secondary | ICD-10-CM | POA: Diagnosis not present

## 2020-05-10 ENCOUNTER — Telehealth: Payer: Self-pay | Admitting: *Deleted

## 2020-05-10 ENCOUNTER — Other Ambulatory Visit: Payer: Self-pay | Admitting: Internal Medicine

## 2020-05-10 MED ORDER — HYDROCHLOROTHIAZIDE 12.5 MG PO CAPS: 12.5000 mg | ORAL_CAPSULE | Freq: Every day | ORAL | 0 refills | Status: DC

## 2020-05-10 NOTE — Chronic Care Management (AMB) (Signed)
   Care Management   Outreach Note  05/10/2020 Name: [name] Nielsen MRN: [id number] DOB: Sep 02, 1976  [name] is a 44 y.o. year old female who is a primary care patient of Dale [place], MD. I reached out to [name] by phone today in response to a referral sent by Ms. [name] PCP, Dale , MD.     An unsuccessful telephone outreach was attempted today. The patient was referred to the case management team for assistance with care management and care coordination.   Follow Up Plan: A HIPPA compliant phone message was left for the patient providing contact information and requesting a return call. The care management team will reach out to the patient again over the next 7 days. If patient returns call to provider office, please advise to call Embedded Care Management Care Guide Gwenevere Ghazi at [phone].  Gwenevere Ghazi  Care Guide, Embedded Care Coordination [facility]  [place], [place] [address] Direct Dial: [phone] [email or web address] Website: [email or web address]

## 2020-05-10 NOTE — Chronic Care Management (AMB) (Signed)
  Care Management   Note  05/10/2020 Name: [name] Nielsen MRN: [id number] DOB: [birth year]  [name] Nielsen is a 44 y.o. year old female who is a primary care patient of Dale [place], MD. I reached out to [name] by phone today in response to a referral sent by Ms. [name] PCP, Dale [place], MD.  Ms. Sinha was given information about care management services today including:  1. Care management services include personalized support from designated clinical staff supervised by her physician, including individualized plan of care and coordination with other care providers 2. 24/7 contact phone numbers for assistance for urgent and routine care needs. 3. The patient may stop care management services at any time by phone call to the office staff.  Patient agreed to services and verbal consent obtained.   Follow up plan: Telephone appointment with care management team member scheduled for: 05/26/2020  [facility] Guide, Embedded Care Coordination [facility]  [place], [place] [address] Direct Dial: [phone] [email or web address] Website: [email or web address]

## 2020-05-10 NOTE — Telephone Encounter (Signed)
[name] Nielsen, this pt sent me a my chart message wanting to try [facility]. Her a1c's have been slightly elevated.  I am going to place a CCM referral to discuss with her.  I also mentioned to her about the [facility] weight loss program.

## 2020-05-20 ENCOUNTER — Other Ambulatory Visit: Payer: Self-pay | Admitting: Internal Medicine

## 2020-05-26 ENCOUNTER — Ambulatory Visit: Payer: Managed Care, Other (non HMO) | Admitting: Pharmacist

## 2020-05-26 DIAGNOSIS — Z6831 Body mass index (BMI) 31.0-31.9, adult: Secondary | ICD-10-CM

## 2020-05-26 DIAGNOSIS — R739 Hyperglycemia, unspecified: Secondary | ICD-10-CM

## 2020-05-26 MED ORDER — WEGOVY 1 MG/0.5ML ~~LOC~~ SOAJ: 1.0000 mg | SUBCUTANEOUS | 0 refills | Status: DC

## 2020-05-26 MED ORDER — WEGOVY 0.5 MG/0.5ML ~~LOC~~ SOAJ: 0.5000 mg | SUBCUTANEOUS | 0 refills | Status: DC

## 2020-05-26 MED ORDER — WEGOVY 0.25 MG/0.5ML ~~LOC~~ SOAJ: 0.2500 mg | SUBCUTANEOUS | 0 refills | Status: DC

## 2020-05-26 NOTE — Chronic Care Management (AMB) (Signed)
**Note [name]-Identified via Obfuscation** Chronic Care Management   Note  05/26/2020 Name: [name] Nielsen MRN: [id number] DOB: [birth year]   Subjective:  [name] Nielsen is a 44 y.o. year old female who is a primary care patient of [name] Nielsen. The CCM team was consulted for assistance with chronic disease management and care coordination needs.     [name] was given information about Chronic Care Management services today including:  1. CCM service includes personalized support from designated clinical staff supervised by her physician, including individualized plan of care and coordination with other care providers 2. Only one practitioner may furnish and bill the service in a calendar month. 3. The patient may stop CCM services at any time (effective at the end of the month) by phone call to the office staff.  Patient agreed to services and verbal consent obtained.   Review of patient status, including review of consultants reports, laboratory and other test data, was performed as part of comprehensive evaluation and provision of chronic care management services.   SDOH (Social Determinants of Health) assessments and interventions performed:  SDOH Interventions     Most Recent Value  SDOH Interventions  SDOH Interventions for the Following Domains Financial Strain  Financial Strain Interventions Intervention Not Indicated       Objective:  Lab Results  Component Value Date   CREATININE 0.99 02/23/2020   CREATININE 0.93 09/20/2019   CREATININE 0.97 04/20/2019    Lab Results  Component Value Date   HGBA1C 5.8 (H) 02/23/2020       Component Value Date/Time   CHOL 208 (H) 02/23/2020 1227   TRIG 90 02/23/2020 1227   HDL 75 02/23/2020 1227   CHOLHDL 2.8 02/23/2020 1227   CHOLHDL 4 09/20/2019 0816   VLDL 16.4 09/20/2019 0816   LDLCALC 117 (H) 02/23/2020 1227   LDLDIRECT 178.3 11/11/2013 1059    Clinical ASCVD: No  The 10-year ASCVD risk score [name] Nielsen., et al., 2013) is: 0.7%   Values used to  calculate the score:     Age: 36 years     Sex: Female     Is Non-Hispanic African American: No     Diabetic: No     Tobacco smoker: No     Systolic Blood Pressure: 924 mmHg     Is BP treated: Yes     HDL Cholesterol: 75 mg/dL     Total Cholesterol: 208 mg/dL    BP Readings from Last 3 Encounters:  02/23/20 120/78  09/23/19 108/64  05/29/19 108/70    No Known Allergies  Medications Reviewed Today    Reviewed by [name] Nielsen, [name] Nielsen ([name] Nielsen) on 05/26/20 at 1106  Med List Status: <None>  Medication Order Taking? Sig Documenting Provider Last Dose Status Informant  acetaminophen (TYLENOL) 500 MG tablet [id number] Yes Take 1,000 mg by mouth every 6 (six) hours as needed for mild pain or moderate pain. Provider, Historical, Nielsen Taking Active Self  ALPRAZolam (XANAX) 0.25 MG tablet [id number] No Take 1 tablet (0.25 mg total) by mouth daily as needed for anxiety.  Patient not taking: Reported on 05/26/2020   [name] Nielsen Not Taking Active   buPROPion (WELLBUTRIN XL) 150 MG 24 hr tablet [id number] Yes Take 1 tablet by mouth once daily [name] Nielsen Taking Active   cholecalciferol (VITAMIN D3) 25 MCG (1000 UNIT) tablet [id number] Yes Take 2,000 Units by mouth daily. Provider, Historical, Nielsen Taking Active   Cyanocobalamin (B-12 PO) [id number] Yes Take by mouth. Provider, Historical, Nielsen Taking Active  EUTHYROX 150 MCG tablet [id number] Yes TAKE 1 TABLET BY MOUTH ONCE DAILY BEFORE Barrington Ellison, [place] Patient, Nielsen Taking Active   hydrochlorothiazide (MICROZIDE) 12.5 MG capsule [id number] Yes Take 1 capsule (12.5 mg total) by mouth daily. [name] Nielsen Taking Active   lisinopril (ZESTRIL) 10 MG tablet [id number] Yes Take 1 tablet by mouth once daily [name] Nielsen Taking Active   rosuvastatin (CRESTOR) 5 MG tablet [id number] Yes Take 1 tablet (5 mg total) by mouth daily. [name] Nielsen Taking Active   Med List Note [name] Nielsen, [name] Nielsen 08/25/18 1118): Order Labs for  [facility]           Assessment:   Goals Addressed              This Visit's Progress     Patient Stated     PharmD "I want to work on weight loss" (pt-stated)        CARE PLAN ENTRY (see longitudinal plan of care for additional care plan information)  Current Barriers:   Social, financial, community barriers:  o No financial needs at this time. Reports frustration with inability to continue to lose weight. Read about [name] Nielsen and is interested  Obesity complicated by chronic medical conditions including HTN, HLD, preDM, most recent BMI 31.70 (weight 203 lbs) o Has lost ~ 50 lbs in the past year, and is at a plateau in the past 6 months. Weight 09/2019 204 lbs  Current meal patterns: o Breakfast: weekends has breakfast; during week, has coffee w/ heavy cream o Lunch: brings lunch (chicken salad w/ crackers, sometimes eats out soup and salad);  o Supper: meat and vegetable, brussel sprouts; prefers vegetables and tries to avoid starches o Snacks: almonds, pecans, sometimes cheese stick o Drinks: unsweet tea, coffee, water; uses splenda   Current exercise: exercises every day of the week for 30-45 minutes daily; cardio, weight training; advanced classes on weekends  Current weight management pharmacotherapy: noneHx phentermine therapy - successful during therapy, but gained weight back. Retrial of phentermine inappropriate given HTN  Pre-DM: diet controlled, last A1c 5.8%  HTN: lisinopril 10 mg daily, HCTZ 12.5 mg daily  HLD: rosuvastatin 5 mg daily; last LDL 117, consider increasing to 10 mg daily  Hypothyroidism: Euthyrox 150 mcg daily   Depression/anxiety: bupropion XL 150 mg daily  [name] Nielsen Clinical Goal(s):   Over the next 90 days, patient will work with PharmD and primary care provider to work towards 5-10% body weight loss  Interventions:  Comprehensive medication review performed, medication list updated in electronic medical record  Inter-disciplinary  care team collaboration (see longitudinal plan of care)  Patient has not met goal of at least 5% of body weight loss with comprehensive lifestyle modifications alone in the past 6 months. Pharmacotherapy is appropriate to pursue as augmentation. Start Wegovy 0.25 mg x 4 weeks, then increase to 0.5 mg x 4 weeks, then 1 mg weekly x 4 weeks, then 1.7 mg   Completed PA today.   Patient Self Care Activities:   Patient will adhere to dietary modifications  Patient will target at least 150 minutes of moderate intensity exercise weekly  Patient will report any questions or concerns to provider   Initial goal documentation        Plan: - Will follow up on status of PA next week.   Catie Darnelle Maffucci, PharmD, [place], CPP Clinical [name] Nielsen [place] [phone]

## 2020-05-26 NOTE — Patient Instructions (Signed)
Visit Information  Goals Addressed              This Visit's Progress     Patient Stated   .  PharmD "I want to work on weight loss" (pt-stated)        [place] (see longitudinal plan of care for additional care plan information)  Current Barriers:  . Social, financial, community barriers:  o No financial needs at this time. Reports frustration with inability to continue to lose weight. Read about Mancel Parsons and is interested . Obesity complicated by chronic medical conditions including HTN, HLD, preDM, most recent BMI 31.70 (weight 203 lbs) o Has lost ~ 50 lbs in the past year, and is at a plateau in the past 6 months. Weight 09/2019 204 lbs . Current meal patterns: o Breakfast: weekends has breakfast; during week, has coffee w/ heavy cream o Lunch: brings lunch (chicken salad w/ crackers, sometimes eats out soup and salad);  o Supper: meat and vegetable, brussel sprouts; prefers vegetables and tries to avoid starches o Snacks: almonds, pecans, sometimes cheese stick o Drinks: unsweet tea, coffee, water; uses splenda  . Current exercise: exercises every day of the week for 30-45 minutes daily; cardio, weight training; advanced classes on weekends . Current weight management pharmacotherapy: noneHx phentermine therapy - successful during therapy, but gained weight back. Retrial of phentermine inappropriate given HTN . Pre-DM: diet controlled, last A1c 5.8% . HTN: lisinopril 10 mg daily, HCTZ 12.5 mg daily . HLD: rosuvastatin 5 mg daily; last LDL 117, consider increasing to 10 mg daily . Hypothyroidism: Euthyrox 150 mcg daily  . Depression/anxiety: bupropion XL 150 mg daily  Pharmacist Clinical Goal(s):  Marland Kitchen Over the next 90 days, patient will work with PharmD and primary care provider to work towards 5-10% body weight loss  Interventions: . Comprehensive medication review performed, medication list updated in electronic medical record . Inter-disciplinary care team  collaboration (see longitudinal plan of care) . Patient has not met goal of at least 5% of body weight loss with comprehensive lifestyle modifications alone in the past 6 months. Pharmacotherapy is appropriate to pursue as augmentation. Start Wegovy 0.25 mg x 4 weeks, then increase to 0.5 mg x 4 weeks, then 1 mg weekly x 4 weeks, then 1.7 mg  . Completed PA today.   Patient Self Care Activities:  . Patient will adhere to dietary modifications . Patient will target at least 150 minutes of moderate intensity exercise weekly . Patient will report any questions or concerns to provider   Initial goal documentation        Ms. Candy was given information about Chronic Care Management services today including:  1. CCM service includes personalized support from designated clinical staff supervised by her physician, including individualized plan of care and coordination with other care providers 2. 24/7 contact phone numbers for assistance for urgent and routine care needs. 3. The patient may stop CCM services at any time (effective at the end of the month) by phone call to the office staff.  Patient agreed to services and verbal consent obtained.   The patient verbalized understanding of instructions provided today and declined a print copy of patient instruction materials.   Plan: - Will follow up on status of PA next week.   Catie Darnelle Maffucci, PharmD, [place], CPP Clinical Pharmacist [place] [phone]

## 2020-05-27 NOTE — Progress Notes (Signed)
I have reviewed the above note and agree. I was available to the pharmacist for consultation.  Aloys Hupfer, MD 

## 2020-05-30 ENCOUNTER — Ambulatory Visit: Payer: Managed Care, Other (non HMO) | Admitting: Pharmacist

## 2020-05-30 DIAGNOSIS — Z6831 Body mass index (BMI) 31.0-31.9, adult: Secondary | ICD-10-CM

## 2020-05-30 NOTE — Patient Instructions (Signed)
Visit Information  Goals Addressed              This Visit's Progress     Patient Stated   .  PharmD "I want to work on weight loss" (pt-stated)        CARE PLAN ENTRY (see longitudinal plan of care for additional care plan information)  Current Barriers:  . Social, financial, community barriers:  o Following up on [place] for  . Obesity complicated by chronic medical conditions including HTN, HLD, preDM, most recent BMI 31.70 (weight 203 lbs) o Has lost ~ 50 lbs in the past year, and is at a plateau in the past 6 months. Weight 09/2019 204 lbs . Current meal patterns: o Breakfast: weekends has breakfast; during week, has coffee w/ heavy cream o Lunch: brings lunch (chicken salad w/ crackers, sometimes eats out soup and salad);  o Supper: meat and vegetable, brussel sprouts; prefers vegetables and tries to avoid starches o Snacks: almonds, pecans, sometimes cheese stick o Drinks: unsweet tea, coffee, water; uses splenda  . Current exercise: exercises every day of the week for 30-45 minutes daily; cardio, weight training; advanced classes on weekends . Current weight management pharmacotherapy: noneHx phentermine therapy - successful during therapy, but gained weight back. Retrial of phentermine inappropriate given HTN . Pre-DM: diet controlled, last A1c 5.8% . HTN: lisinopril 10 mg daily, HCTZ 12.5 mg daily . HLD: rosuvastatin 5 mg daily; last LDL 117, consider increasing to 10 mg daily . Hypothyroidism: Euthyrox 150 mcg daily  . Depression/anxiety: bupropion XL 150 mg daily  Pharmacist Clinical Goal(s):  Marland Kitchen Over the next 90 days, patient will work with PharmD and primary care provider to work towards 5-10% body weight loss  Interventions: . Received notification from Cover My Meds that PA is not processed by the entity that PA was submitted to. Attempted to contact [employer] to determine where to submit PA, was on hold for 20 minutes. Will attempt call tomorrow.  Patient Self Care  Activities:  . Patient will adhere to dietary modifications . Patient will target at least 150 minutes of moderate intensity exercise weekly . Patient will report any questions or concerns to provider   Please see past updates related to this goal by clicking on the "Past Updates" button in the selected goal         The patient verbalized understanding of instructions provided today and declined a print copy of patient instruction materials.    Plan:  - Will f/u with insurance tomorrow  Catie Feliz Beam, PharmD, [place], CPP Clinical Pharmacist [facility] [employer] [phone]

## 2020-05-30 NOTE — Chronic Care Management (AMB) (Signed)
Chronic Care Management   Follow Up Note   05/30/2020 Name: [name] Nielsen MRN: [id number] DOB: [birth year]  Referred by: Dale [place], MD Reason for referral : Chronic Care Management (Medication Management)   [name] Nielsen is a 44 y.o. year old female who is a primary care patient of Dale [place], MD. The CCM team was consulted for assistance with chronic disease management and care coordination needs.    Care coordination today.   Review of patient status, including review of consultants reports, relevant laboratory and other test results, and collaboration with appropriate care team members and the patient's provider was performed as part of comprehensive patient evaluation and provision of chronic care management services.    SDOH (Social Determinants of Health) assessments performed: No See Care Plan activities for detailed interventions related to [facility])     Outpatient Encounter Medications as of 05/30/2020  Medication Sig  . acetaminophen (TYLENOL) 500 MG tablet Take 1,000 mg by mouth every 6 (six) hours as needed for mild pain or moderate pain.  Marland Kitchen ALPRAZolam (XANAX) 0.25 MG tablet Take 1 tablet (0.25 mg total) by mouth daily as needed for anxiety. (Patient not taking: Reported on 05/26/2020)  . buPROPion (WELLBUTRIN XL) 150 MG 24 hr tablet Take 1 tablet by mouth once daily  . cholecalciferol (VITAMIN D3) 25 MCG (1000 UNIT) tablet Take 2,000 Units by mouth daily.  . Cyanocobalamin (B-12 PO) Take by mouth.  . EUTHYROX 150 MCG tablet TAKE 1 TABLET BY MOUTH ONCE DAILY BEFORE BREAKFAST  . hydrochlorothiazide (MICROZIDE) 12.5 MG capsule Take 1 capsule (12.5 mg total) by mouth daily.  Marland Kitchen lisinopril (ZESTRIL) 10 MG tablet Take 1 tablet by mouth once daily  . rosuvastatin (CRESTOR) 5 MG tablet Take 1 tablet (5 mg total) by mouth daily.  . Semaglutide-Weight Management (WEGOVY) 0.25 MG/0.5ML SOAJ Inject 0.5 mLs (0.25 mg total) into the skin once a week.  . Semaglutide-Weight  Management (WEGOVY) 0.5 MG/0.5ML SOAJ Inject 0.5 mLs (0.5 mg total) into the skin once a week.  . Semaglutide-Weight Management (WEGOVY) 1 MG/0.5ML SOAJ Inject 0.5 mLs (1 mg total) into the skin once a week.   No facility-administered encounter medications on file as of 05/30/2020.     Objective:   Goals Addressed              This Visit's Progress     Patient Stated   .  PharmD "I want to work on weight loss" (pt-stated)        CARE PLAN ENTRY (see longitudinal plan of care for additional care plan information)  Current Barriers:  . Social, financial, community barriers:  o Following up on [place] for  . Obesity complicated by chronic medical conditions including HTN, HLD, preDM, most recent BMI 31.70 (weight 203 lbs) o Has lost ~ 50 lbs in the past year, and is at a plateau in the past 6 months. Weight 09/2019 204 lbs . Current meal patterns: o Breakfast: weekends has breakfast; during week, has coffee w/ heavy cream o Lunch: brings lunch (chicken salad w/ crackers, sometimes eats out soup and salad);  o Supper: meat and vegetable, brussel sprouts; prefers vegetables and tries to avoid starches o Snacks: almonds, pecans, sometimes cheese stick o Drinks: unsweet tea, coffee, water; uses splenda  . Current exercise: exercises every day of the week for 30-45 minutes daily; cardio, weight training; advanced classes on weekends . Current weight management pharmacotherapy: noneHx phentermine therapy - successful during therapy, but gained weight back. Retrial of phentermine inappropriate  given HTN . Pre-DM: diet controlled, last A1c 5.8% . HTN: lisinopril 10 mg daily, HCTZ 12.5 mg daily . HLD: rosuvastatin 5 mg daily; last LDL 117, consider increasing to 10 mg daily . Hypothyroidism: Euthyrox 150 mcg daily  . Depression/anxiety: bupropion XL 150 mg daily  Pharmacist Clinical Goal(s):  Marland Kitchen Over the next 90 days, patient will work with PharmD and primary care provider to work towards 5-10%  body weight loss  Interventions: . Received notification from Cover My Meds that PA is not processed by the entity that PA was submitted to. Attempted to contact [employer] to determine where to submit PA, was on hold for 20 minutes. Will attempt call tomorrow.  Patient Self Care Activities:  . Patient will adhere to dietary modifications . Patient will target at least 150 minutes of moderate intensity exercise weekly . Patient will report any questions or concerns to provider   Please see past updates related to this goal by clicking on the "Past Updates" button in the selected goal          Plan:  - Will f/u with insurance tomorrow  Catie Feliz Beam, PharmD, [place], CPP Clinical Pharmacist [facility] [employer] [phone]

## 2020-05-31 ENCOUNTER — Ambulatory Visit: Payer: Managed Care, Other (non HMO) | Admitting: Pharmacist

## 2020-05-31 DIAGNOSIS — I10 Essential (primary) hypertension: Secondary | ICD-10-CM

## 2020-05-31 DIAGNOSIS — Z6831 Body mass index (BMI) 31.0-31.9, adult: Secondary | ICD-10-CM

## 2020-05-31 DIAGNOSIS — E7800 Pure hypercholesterolemia, unspecified: Secondary | ICD-10-CM

## 2020-05-31 NOTE — Progress Notes (Signed)
I have reviewed the above note and agree. I was available to the pharmacist for consultation.  Mihail Prettyman, MD 

## 2020-05-31 NOTE — Chronic Care Management (AMB) (Signed)
Chronic Care Management   Follow Up Note   05/31/2020 Name: [name] Nielsen MRN: [id number] DOB: April 29, 1976  Referred by: Dale [place], MD Reason for referral : Chronic Care Management (Medication Management)   Corrine Nielsen is a 44 y.o. year old female who is a primary care patient of Dale [place], MD. The CCM team was consulted for assistance with chronic disease management and care coordination needs.    Care coordination for medication access needs today.  Review of patient status, including review of consultants reports, relevant laboratory and other test results, and collaboration with appropriate care team members and the patient's provider was performed as part of comprehensive patient evaluation and provision of chronic care management services.    SDOH (Social Determinants of Health) assessments performed: No See Care Plan activities for detailed interventions related to [facility])     Outpatient Encounter Medications as of 05/31/2020  Medication Sig  . acetaminophen (TYLENOL) 500 MG tablet Take 1,000 mg by mouth every 6 (six) hours as needed for mild pain or moderate pain.  Marland Kitchen ALPRAZolam (XANAX) 0.25 MG tablet Take 1 tablet (0.25 mg total) by mouth daily as needed for anxiety. (Patient not taking: Reported on 05/26/2020)  . buPROPion (WELLBUTRIN XL) 150 MG 24 hr tablet Take 1 tablet by mouth once daily  . cholecalciferol (VITAMIN D3) 25 MCG (1000 UNIT) tablet Take 2,000 Units by mouth daily.  . Cyanocobalamin (B-12 PO) Take by mouth.  . EUTHYROX 150 MCG tablet TAKE 1 TABLET BY MOUTH ONCE DAILY BEFORE BREAKFAST  . hydrochlorothiazide (MICROZIDE) 12.5 MG capsule Take 1 capsule (12.5 mg total) by mouth daily.  Marland Kitchen lisinopril (ZESTRIL) 10 MG tablet Take 1 tablet by mouth once daily  . rosuvastatin (CRESTOR) 5 MG tablet Take 1 tablet (5 mg total) by mouth daily.  . Semaglutide-Weight Management (WEGOVY) 0.25 MG/0.5ML SOAJ Inject 0.5 mLs (0.25 mg total) into the skin once a week.    . Semaglutide-Weight Management (WEGOVY) 0.5 MG/0.5ML SOAJ Inject 0.5 mLs (0.5 mg total) into the skin once a week.  . Semaglutide-Weight Management (WEGOVY) 1 MG/0.5ML SOAJ Inject 0.5 mLs (1 mg total) into the skin once a week.   No facility-administered encounter medications on file as of 05/31/2020.     Objective:   Goals Addressed              This Visit's Progress     Patient Stated   .  PharmD "I want to work on weight loss" (pt-stated)        CARE PLAN ENTRY (see longitudinal plan of care for additional care plan information)  Current Barriers:  . Social, financial, community barriers:  o Following up on [place] for [employer]. Received response from Cover my Meds that [employer] did not managed pharmacy benefits for patient . Obesity complicated by chronic medical conditions including HTN, HLD, preDM, most recent BMI 31.70 (weight 203 lbs) o Has lost ~ 50 lbs in the past year, and is at a plateau in the past 6 months. Weight 09/2019 204 lbs . Current weight management pharmacotherapy: none o Hx phentermine therapy - successful during therapy, but gained weight back. Retrial of phentermine inappropriate given HTN . Pre-DM: diet controlled, last A1c 5.8% . HTN: lisinopril 10 mg daily, HCTZ 12.5 mg daily . HLD: rosuvastatin 5 mg daily; last LDL 117, consider increasing to 10 mg daily . Hypothyroidism: Euthyrox 150 mcg daily  . Depression/anxiety: bupropion XL 150 mg daily  Pharmacist Clinical Goal(s):  Marland Kitchen Over the next 90 days, patient  will work with PharmD and primary care provider to work towards 5-10% body weight loss  Interventions: . Contacted [employer], eventually transferred to [facility] PA department [phone]). Provided verbal PA for Wegovy 0.25 mg Case number [id number]. Should receive decision within 72 hours. Patient's [facility] prescription ID is [id number]  Patient Self Care Activities:  . Patient will adhere to dietary modifications . Patient will target at least 150  minutes of moderate intensity exercise weekly . Patient will report any questions or concerns to provider   Please see past updates related to this goal by clicking on the "Past Updates" button in the selected goal          Plan:  - If I do not hear back on PA decision within 72 hours, will outreach [facility]  Catie Feliz Beam, PharmD, [place], CPP Clinical Pharmacist [facility] [employer] [phone]

## 2020-05-31 NOTE — Patient Instructions (Signed)
Visit Information  Goals Addressed              This Visit's Progress     Patient Stated   .  PharmD "I want to work on weight loss" (pt-stated)        CARE PLAN ENTRY (see longitudinal plan of care for additional care plan information)  Current Barriers:  . Social, financial, community barriers:  o Following up on [place] for [employer]. Received response from Cover my Meds that [employer] did not managed pharmacy benefits for patient . Obesity complicated by chronic medical conditions including HTN, HLD, preDM, most recent BMI 31.70 (weight 203 lbs) o Has lost ~ 50 lbs in the past year, and is at a plateau in the past 6 months. Weight 09/2019 204 lbs . Current weight management pharmacotherapy: none o Hx phentermine therapy - successful during therapy, but gained weight back. Retrial of phentermine inappropriate given HTN . Pre-DM: diet controlled, last A1c 5.8% . HTN: lisinopril 10 mg daily, HCTZ 12.5 mg daily . HLD: rosuvastatin 5 mg daily; last LDL 117, consider increasing to 10 mg daily . Hypothyroidism: Euthyrox 150 mcg daily  . Depression/anxiety: bupropion XL 150 mg daily  Pharmacist Clinical Goal(s):  Marland Kitchen Over the next 90 days, patient will work with PharmD and primary care provider to work towards 5-10% body weight loss  Interventions: . Contacted [employer], eventually transferred to [facility] PA department [phone]). Provided verbal PA for Wegovy 0.25 mg Case number [id number]. Should receive decision within 72 hours. Patient's [facility] prescription ID is [id number]  Patient Self Care Activities:  . Patient will adhere to dietary modifications . Patient will target at least 150 minutes of moderate intensity exercise weekly . Patient will report any questions or concerns to provider   Please see past updates related to this goal by clicking on the "Past Updates" button in the selected goal         The patient verbalized understanding of instructions provided today and declined  a print copy of patient instruction materials.    Plan:  - If I do not hear back on PA decision within 72 hours, will outreach [facility]  Catie Feliz Beam, PharmD, [place], CPP Clinical Pharmacist [facility] [employer] [phone]

## 2020-06-01 NOTE — Progress Notes (Signed)
I have reviewed the above note and agree. I was available to the pharmacist for consultation.  Lexey Fletes, MD 

## 2020-06-02 ENCOUNTER — Telehealth: Payer: Self-pay | Admitting: Pharmacist

## 2020-06-02 ENCOUNTER — Telehealth: Payer: Managed Care, Other (non HMO)

## 2020-06-02 NOTE — Telephone Encounter (Signed)
°  Chronic Care Management   Note  06/02/2020 Name: [name] Nielsen MRN: [id number] DOB: 03-25-1976   Received voicemail from patient inquiring about status of PA for [facility]   Called back, left VM letting her know that I had to re-submit PA on 8/25 to [facility], and if I do not hear a decision within 72 business hours of that, I will reach back out and keep her updated.   Plan: - If I do not hear back from [facility] on PA by end of business Monday, will call [facility]  Catie Feliz Beam, PharmD, [place], CPP Clinical Pharmacist [facility] [employer] [phone]

## 2020-06-04 ENCOUNTER — Other Ambulatory Visit: Payer: Self-pay | Admitting: Internal Medicine

## 2020-06-08 ENCOUNTER — Ambulatory Visit: Payer: Managed Care, Other (non HMO) | Admitting: Pharmacist

## 2020-06-08 DIAGNOSIS — Z6831 Body mass index (BMI) 31.0-31.9, adult: Secondary | ICD-10-CM

## 2020-06-08 NOTE — Chronic Care Management (AMB) (Signed)
Chronic Care Management   Follow Up Note   06/08/2020 Name: [name] Nielsen MRN: [id number] DOB: July 25, 1976  Referred by: Dale [place], MD Reason for referral : Chronic Care Management (Medication Management)   [name] Nielsen is a 44 y.o. year old female who is a primary care patient of Dale [place], MD. The CCM team was consulted for assistance with chronic disease management and care coordination needs.    Follow up on medication access.  Review of patient status, including review of consultants reports, relevant laboratory and other test results, and collaboration with appropriate care team members and the patient's provider was performed as part of comprehensive patient evaluation and provision of chronic care management services.    SDOH (Social Determinants of Health) assessments performed: No See Care Plan activities for detailed interventions related to [facility])     Outpatient Encounter Medications as of 06/08/2020  Medication Sig  . acetaminophen (TYLENOL) 500 MG tablet Take 1,000 mg by mouth every 6 (six) hours as needed for mild pain or moderate pain.  Marland Kitchen ALPRAZolam (XANAX) 0.25 MG tablet Take 1 tablet (0.25 mg total) by mouth daily as needed for anxiety. (Patient not taking: Reported on 05/26/2020)  . buPROPion (WELLBUTRIN XL) 150 MG 24 hr tablet Take 1 tablet by mouth once daily  . cholecalciferol (VITAMIN D3) 25 MCG (1000 UNIT) tablet Take 2,000 Units by mouth daily.  . Cyanocobalamin (B-12 PO) Take by mouth.  . EUTHYROX 150 MCG tablet TAKE 1 TABLET BY MOUTH ONCE DAILY BEFORE BREAKFAST  . hydrochlorothiazide (MICROZIDE) 12.5 MG capsule Take 1 capsule (12.5 mg total) by mouth daily.  Marland Kitchen lisinopril (ZESTRIL) 10 MG tablet Take 1 tablet by mouth once daily  . rosuvastatin (CRESTOR) 5 MG tablet Take 1 tablet (5 mg total) by mouth daily.  . Semaglutide-Weight Management (WEGOVY) 0.25 MG/0.5ML SOAJ Inject 0.5 mLs (0.25 mg total) into the skin once a week.  . Semaglutide-Weight  Management (WEGOVY) 0.5 MG/0.5ML SOAJ Inject 0.5 mLs (0.5 mg total) into the skin once a week.  . Semaglutide-Weight Management (WEGOVY) 1 MG/0.5ML SOAJ Inject 0.5 mLs (1 mg total) into the skin once a week.   No facility-administered encounter medications on file as of 06/08/2020.     Objective:   Goals Addressed              This Visit's Progress     Patient Stated   .  PharmD "I want to work on weight loss" (pt-stated)        CARE PLAN ENTRY (see longitudinal plan of care for additional care plan information)  Current Barriers:  . Social, financial, community barriers:  o Following up on [place] for [employer]. . Obesity complicated by chronic medical conditions including HTN, HLD, preDM, most recent BMI 31.70 (weight 203 lbs) o Has lost ~ 50 lbs in the past year, and is at a plateau in the past 6 months. Weight 09/2019 204 lbs . Current weight management pharmacotherapy: none o Hx phentermine therapy - successful during therapy, but gained weight back. Retrial of phentermine inappropriate given HTN . Pre-DM: diet controlled, last A1c 5.8% . HTN: lisinopril 10 mg daily, HCTZ 12.5 mg daily . HLD: rosuvastatin 5 mg daily; last LDL 117, consider increasing to 10 mg daily . Hypothyroidism: Euthyrox 150 mcg daily  . Depression/anxiety: bupropion XL 150 mg daily  Pharmacist Clinical Goal(s):  Marland Kitchen Over the next 90 days, patient will work with PharmD and primary care provider to work towards 5-10% body weight loss  Interventions: .  Comprehensive medication review performed, medication list updated in electronic medical record . Inter-disciplinary care team collaboration (see longitudinal plan of care) . Contacted [facility] PA department. They denied PA because they had documented that patient had not tried and failed two preferred options (Qsymia, phentermine, benzphentamine, Saxenda). Reprocessed the PA on the phone noting that she has tried phentermine, and that benzphetamine and Qsymia  (contains phentermine) are not appropriate options given HTN. Also completed PA for Wegovy 0.5 mg strength via CoverMyMeds. (BIN: [id number],  . RXGroupVerdell Carmine, PCN: [id number], ID [id number])  Patient Self Care Activities:  . Patient will adhere to dietary modifications . Patient will target at least 150 minutes of moderate intensity exercise weekly . Patient will report any questions or concerns to provider   Please see past updates related to this goal by clicking on the "Past Updates" button in the selected goal          Plan:  - Will await PA response.   Catie Feliz Beam, PharmD, [place], CPP Clinical Pharmacist [facility] [employer] [phone]

## 2020-06-08 NOTE — Progress Notes (Signed)
I have reviewed the above note and agree. I was available to the pharmacist for consultation.  Virdell Hoiland, MD 

## 2020-06-08 NOTE — Telephone Encounter (Signed)
Do I need to do anything with this?

## 2020-06-08 NOTE — Telephone Encounter (Signed)
Covermymeds [place] called for preauthorize for [facility] for patient was faxed in on 05-31-20 ref key is [id number] contact number [phone]

## 2020-06-08 NOTE — Telephone Encounter (Addendum)
No, I just called. Cover My Meds got confused about the PA that I started verbally w/ [employer].   No action needed at this time.

## 2020-06-08 NOTE — Patient Instructions (Signed)
Visit Information  Goals Addressed              This Visit's Progress     Patient Stated   .  PharmD "I want to work on weight loss" (pt-stated)        CARE PLAN ENTRY (see longitudinal plan of care for additional care plan information)  Current Barriers:  . Social, financial, community barriers:  o Following up on [place] for [employer]. . Obesity complicated by chronic medical conditions including HTN, HLD, preDM, most recent BMI 31.70 (weight 203 lbs) o Has lost ~ 50 lbs in the past year, and is at a plateau in the past 6 months. Weight 09/2019 204 lbs . Current weight management pharmacotherapy: none o Hx phentermine therapy - successful during therapy, but gained weight back. Retrial of phentermine inappropriate given HTN . Pre-DM: diet controlled, last A1c 5.8% . HTN: lisinopril 10 mg daily, HCTZ 12.5 mg daily . HLD: rosuvastatin 5 mg daily; last LDL 117, consider increasing to 10 mg daily . Hypothyroidism: Euthyrox 150 mcg daily  . Depression/anxiety: bupropion XL 150 mg daily  Pharmacist Clinical Goal(s):  Marland Kitchen Over the next 90 days, patient will work with PharmD and primary care provider to work towards 5-10% body weight loss  Interventions: . Comprehensive medication review performed, medication list updated in electronic medical record . Inter-disciplinary care team collaboration (see longitudinal plan of care) . Contacted [facility] PA department. They denied PA because they had documented that patient had not tried and failed two preferred options (Qsymia, phentermine, benzphentamine, Saxenda). Reprocessed the PA on the phone noting that she has tried phentermine, and that benzphetamine and Qsymia (contains phentermine) are not appropriate options given HTN. Also completed PA for Wegovy 0.5 mg strength via CoverMyMeds. (BIN: [id number],  . RXGroupVerdell Carmine, PCN: [id number], ID [id number])  Patient Self Care Activities:  . Patient will adhere to dietary modifications . Patient will target  at least 150 minutes of moderate intensity exercise weekly . Patient will report any questions or concerns to provider   Please see past updates related to this goal by clicking on the "Past Updates" button in the selected goal         The patient verbalized understanding of instructions provided today and declined a print copy of patient instruction materials.   Plan:  - Will await PA response.   Catie Feliz Beam, PharmD, [place], CPP Clinical Pharmacist [facility] [employer] [phone]

## 2020-06-15 ENCOUNTER — Ambulatory Visit: Payer: Managed Care, Other (non HMO) | Admitting: Pharmacist

## 2020-06-15 DIAGNOSIS — I10 Essential (primary) hypertension: Secondary | ICD-10-CM

## 2020-06-15 DIAGNOSIS — E7800 Pure hypercholesterolemia, unspecified: Secondary | ICD-10-CM

## 2020-06-15 DIAGNOSIS — Z6831 Body mass index (BMI) 31.0-31.9, adult: Secondary | ICD-10-CM

## 2020-06-15 NOTE — Chronic Care Management (AMB) (Signed)
Chronic Care Management   Follow Up Note   06/15/2020 Name: [name] Nielsen MRN: [id number] DOB: October 09, 1975  Referred by: Dale [place], MD Reason for referral : Chronic Care Management (Medication Management)   [name] Nielsen is a 44 y.o. year old female who is a primary care patient of Dale Candelero [place], MD. The CCM team was consulted for assistance with chronic disease management and care coordination needs.    Contacted patient for medication access follow up today.   Review of patient status, including review of consultants reports, relevant laboratory and other test results, and collaboration with appropriate care team members and the patient's provider was performed as part of comprehensive patient evaluation and provision of chronic care management services.    SDOH (Social Determinants of Health) assessments performed: No See Care Plan activities for detailed interventions related to [facility])     Outpatient Encounter Medications as of 06/15/2020  Medication Sig   acetaminophen (TYLENOL) 500 MG tablet Take 1,000 mg by mouth every 6 (six) hours as needed for mild pain or moderate pain.   ALPRAZolam (XANAX) 0.25 MG tablet Take 1 tablet (0.25 mg total) by mouth daily as needed for anxiety. (Patient not taking: Reported on 05/26/2020)   buPROPion (WELLBUTRIN XL) 150 MG 24 hr tablet Take 1 tablet by mouth once daily   cholecalciferol (VITAMIN D3) 25 MCG (1000 UNIT) tablet Take 2,000 Units by mouth daily.   Cyanocobalamin (B-12 PO) Take by mouth.   EUTHYROX 150 MCG tablet TAKE 1 TABLET BY MOUTH ONCE DAILY BEFORE BREAKFAST   hydrochlorothiazide (MICROZIDE) 12.5 MG capsule Take 1 capsule (12.5 mg total) by mouth daily.   lisinopril (ZESTRIL) 10 MG tablet Take 1 tablet by mouth once daily   rosuvastatin (CRESTOR) 5 MG tablet Take 1 tablet (5 mg total) by mouth daily.   Semaglutide-Weight Management (WEGOVY) 0.25 MG/0.5ML SOAJ Inject 0.5 mLs (0.25 mg total) into the skin once a  week.   Semaglutide-Weight Management (WEGOVY) 0.5 MG/0.5ML SOAJ Inject 0.5 mLs (0.5 mg total) into the skin once a week.   Semaglutide-Weight Management (WEGOVY) 1 MG/0.5ML SOAJ Inject 0.5 mLs (1 mg total) into the skin once a week.   No facility-administered encounter medications on file as of 06/15/2020.     Objective:   Goals Addressed              This Visit's Progress     Patient Stated     PharmD "I want to work on weight loss" (pt-stated)        CARE PLAN ENTRY (see longitudinal plan of care for additional care plan information)  Current Barriers:   Social, financial, community barriers:  o Following up on [place] for [employer].  Obesity complicated by chronic medical conditions including HTN, HLD, preDM, most recent BMI 31.70 (weight 203 lbs) o Has lost ~ 50 lbs in the past year, and is at a plateau in the past 6 months. Weight 09/2019 204 lbs  Current weight management pharmacotherapy: none o Hx phentermine therapy - successful during therapy, but gained weight back. Retrial of phentermine inappropriate given HTN, other phentermine-containing regimens also inappropriate.  Pre-DM: diet controlled, last A1c 5.8%  HTN: lisinopril 10 mg daily, HCTZ 12.5 mg daily  HLD: rosuvastatin 5 mg daily; last LDL 117, consider increasing to 10 mg daily  Hypothyroidism: Euthyrox 150 mcg daily   Depression/anxiety: bupropion XL 150 mg daily  Pharmacist Clinical Goal(s):   Over the next 90 days, patient will work with PharmD and primary care provider to  work towards 5-10% body weight loss  Interventions:  Comprehensive medication review performed, medication list updated in electronic medical record  Inter-disciplinary care team collaboration (see longitudinal plan of care)  Contacted [facility] PA department [phone]). BIN: [id number], Barnabas Lister, PCN: [id number], ID [id number]. They note that PA was APPROVED  on 06/08/20.   [address] to  notify. They noted they would fill. Unsure if there is going to be supply issues, as they hung up before I could ask.   Contacted patient, notified of the above. Instructed to inject Wegovy 0.25 mg once weekly x 4 weeks, then 0.5 mg weekly x 4 weeks, then 1 mg weekly x 4 weeks. We will have follow up by that point, and can assess efficacy and tolerability. Counseled on administration, side effects. Patient verbalizes understanding.  Patient Self Care Activities:   Patient will adhere to dietary modifications  Patient will target at least 150 minutes of moderate intensity exercise weekly  Patient will report any questions or concerns to provider   Please see past updates related to this goal by clicking on the "Past Updates" button in the selected goal          Plan:  - Scheduled f/u call in ~ 8 weeks (pcp f/u in ~ 3 weeks)  Catie Feliz Beam, PharmD, [place], CPP Clinical Pharmacist [facility] [employer] [phone]

## 2020-06-15 NOTE — Progress Notes (Signed)
I have reviewed the above note and agree. I was available to the pharmacist for consultation.  Jaleen Finch, MD 

## 2020-06-15 NOTE — Patient Instructions (Signed)
Visit Information  Goals Addressed              This Visit's Progress     Patient Stated   .  PharmD "I want to work on weight loss" (pt-stated)        CARE PLAN ENTRY (see longitudinal plan of care for additional care plan information)  Current Barriers:  . Social, financial, community barriers:  o Following up on [place] for [employer]. . Obesity complicated by chronic medical conditions including HTN, HLD, preDM, most recent BMI 31.70 (weight 203 lbs) o Has lost ~ 50 lbs in the past year, and is at a plateau in the past 6 months. Weight 09/2019 204 lbs . Current weight management pharmacotherapy: none o Hx phentermine therapy - successful during therapy, but gained weight back. Retrial of phentermine inappropriate given HTN, other phentermine-containing regimens also inappropriate. . Pre-DM: diet controlled, last A1c 5.8% . HTN: lisinopril 10 mg daily, HCTZ 12.5 mg daily . HLD: rosuvastatin 5 mg daily; last LDL 117, consider increasing to 10 mg daily . Hypothyroidism: Euthyrox 150 mcg daily  . Depression/anxiety: bupropion XL 150 mg daily  Pharmacist Clinical Goal(s):  Marland Kitchen Over the next 90 days, patient will work with PharmD and primary care provider to work towards 5-10% body weight loss  Interventions: . Comprehensive medication review performed, medication list updated in electronic medical record . Inter-disciplinary care team collaboration (see longitudinal plan of care) . Contacted [facility] PA department [phone]). BIN: [id number], Barnabas Lister, PCN: [id number], ID [id number]. They note that PA was APPROVED  on 06/08/20.  Marland Kitchen [address] to notify. They noted they would fill. Unsure if there is going to be supply issues, as they hung up before I could ask.  . Contacted patient, notified of the above. Instructed to inject Wegovy 0.25 mg once weekly x 4 weeks, then 0.5 mg weekly x 4 weeks, then 1 mg weekly x 4 weeks. We will have follow up by that point,  and can assess efficacy and tolerability. Counseled on administration, side effects. Patient verbalizes understanding.  Patient Self Care Activities:  . Patient will adhere to dietary modifications . Patient will target at least 150 minutes of moderate intensity exercise weekly . Patient will report any questions or concerns to provider   Please see past updates related to this goal by clicking on the "Past Updates" button in the selected goal         The patient verbalized understanding of instructions provided today and declined a print copy of patient instruction materials.   Plan:  - Scheduled f/u call in ~ 8 weeks (pcp f/u in ~ 3 weeks)  Catie Feliz Beam, PharmD, [place], CPP Clinical Pharmacist [facility] [employer] [phone]

## 2020-06-18 ENCOUNTER — Other Ambulatory Visit: Payer: Self-pay | Admitting: Internal Medicine

## 2020-06-25 ENCOUNTER — Other Ambulatory Visit: Payer: Self-pay | Admitting: Internal Medicine

## 2020-06-26 MED ORDER — HYDROCHLOROTHIAZIDE 12.5 MG PO CAPS
12.5000 mg | ORAL_CAPSULE | Freq: Every day | ORAL | 0 refills | Status: DC
Start: 2020-06-26 — End: 2020-09-04

## 2020-06-27 ENCOUNTER — Other Ambulatory Visit: Payer: Self-pay | Admitting: Internal Medicine

## 2020-07-05 ENCOUNTER — Other Ambulatory Visit: Payer: Self-pay

## 2020-07-05 ENCOUNTER — Other Ambulatory Visit (HOSPITAL_COMMUNITY)
Admission: RE | Admit: 2020-07-05 | Discharge: 2020-07-05 | Disposition: A | Discharge status: Home / Self Care | Payer: Managed Care, Other (non HMO) | Source: Ambulatory Visit | Attending: Internal Medicine | Admitting: Internal Medicine

## 2020-07-05 ENCOUNTER — Ambulatory Visit (INDEPENDENT_AMBULATORY_CARE_PROVIDER_SITE_OTHER): Payer: Managed Care, Other (non HMO) | Admitting: Internal Medicine

## 2020-07-05 ENCOUNTER — Encounter: Payer: Self-pay | Admitting: Internal Medicine

## 2020-07-05 VITALS — BP 118/72 | HR 50 | Temp 98.3°F | Resp 16 | Ht 67.0 in | Wt 207.0 lb

## 2020-07-05 DIAGNOSIS — Z124 Encounter for screening for malignant neoplasm of cervix: Secondary | ICD-10-CM | POA: Insufficient documentation

## 2020-07-05 DIAGNOSIS — E7800 Pure hypercholesterolemia, unspecified: Secondary | ICD-10-CM | POA: Diagnosis not present

## 2020-07-05 DIAGNOSIS — E039 Hypothyroidism, unspecified: Secondary | ICD-10-CM | POA: Diagnosis not present

## 2020-07-05 DIAGNOSIS — Z9884 Bariatric surgery status: Secondary | ICD-10-CM

## 2020-07-05 DIAGNOSIS — I10 Essential (primary) hypertension: Secondary | ICD-10-CM

## 2020-07-05 DIAGNOSIS — F32A Depression, unspecified: Secondary | ICD-10-CM

## 2020-07-05 DIAGNOSIS — D649 Anemia, unspecified: Secondary | ICD-10-CM

## 2020-07-05 DIAGNOSIS — R739 Hyperglycemia, unspecified: Secondary | ICD-10-CM | POA: Diagnosis not present

## 2020-07-05 DIAGNOSIS — F320 Major depressive disorder, single episode, mild: Secondary | ICD-10-CM

## 2020-07-05 NOTE — Progress Notes (Signed)
Patient ID: [name] Nielsen, female   DOB: 1975-10-19, 44 y.o.   MRN: [id number]   Subjective:    Patient ID: [name] Nielsen, female    DOB: 1976-05-16, 44 y.o.   MRN: [id number]  HPI This visit occurred during the SARS-CoV-2 public health emergency.  Safety protocols were in place, including screening questions prior to the visit, additional usage of staff PPE, and extensive cleaning of exam room while observing appropriate contact time as indicated for disinfecting solutions.  Patient here for a scheduled follow up.  She reports she is doing well.  Feels good.  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  This is going well. Staying active.  No chest pain or sob.  No acid reflux.  No abdominal pain reported.  Bowels stable.   Just started wegovy.  Tolerating.  Has one more week of the .25 dose.  Request to have iron checked.  Planning to f/u with GI regarding her colonoscopy.     Past Medical History:  Diagnosis Date  . Anemia    hx of   . Anxiety   . Depression   . Frequent headaches    hx of  . Heart murmur   . Hx: UTI (urinary tract infection)    child, s/p urinary procecure  . Hypercholesterolemia   . Hypertension   . Hypothyroidism    Past Surgical History:  Procedure Laterality Date  . CHOLECYSTECTOMY  2012  . LAPAROSCOPIC GASTRIC SLEEVE RESECTION N/A 07/05/2014   Procedure: LAPAROSCOPIC GASTRIC SLEEVE RESECTION with upper endoscopy;  Surgeon: Kaylyn Lim, MD;  Location: [facility];  Service: General;  Laterality: N/A;  . TUBAL LIGATION  1999   Family History  Problem Relation Age of Onset  . Cancer Mother        brain  . Diabetes Father   . Hypertension Father   . Kidney disease Father   . Stroke Father   . Cancer Father        prostate  . Breast cancer Neg Hx    Social History   Socioeconomic History  . Marital status: Single    Spouse name: Not on file  . Number of children: Not on file  . Years of education: Not on file  . Highest education level: Not on file    Occupational History  . Not on file  Tobacco Use  . Smoking status: Never Smoker  . Smokeless tobacco: Never Used  Substance and Sexual Activity  . Alcohol use: No    Alcohol/week: 0.0 standard drinks  . Drug use: No  . Sexual activity: Not on file  Other Topics Concern  . Not on file  Social History Narrative  . Not on file   Social Determinants of Health   Financial Resource Strain: Low Risk   . Difficulty of Paying Living Expenses: Not hard at all  Food Insecurity:   . Worried About Charity fundraiser in the Last Year: Not on file  . Ran Out of Food in the Last Year: Not on file  Transportation Needs:   . Lack of Transportation (Medical): Not on file  . Lack of Transportation (Non-Medical): Not on file  Physical Activity:   . Days of Exercise per Week: Not on file  . Minutes of Exercise per Session: Not on file  Stress:   . Feeling of Stress : Not on file  Social Connections:   . Frequency of Communication with Friends and Family: Not on file  . Frequency of Social  Gatherings with Friends and Family: Not on file  . Attends Religious Services: Not on file  . Active Member of Clubs or Organizations: Not on file  . Attends Archivist Meetings: Not on file  . Marital Status: Not on file    Outpatient Encounter Medications as of 07/05/2020  Medication Sig  . acetaminophen (TYLENOL) 500 MG tablet Take 1,000 mg by mouth every 6 (six) hours as needed for mild pain or moderate pain.  Marland Kitchen ALPRAZolam (XANAX) 0.25 MG tablet Take 1 tablet (0.25 mg total) by mouth daily as needed for anxiety. (Patient not taking: Reported on 05/26/2020)  . buPROPion (WELLBUTRIN XL) 150 MG 24 hr tablet Take 1 tablet by mouth once daily  . cholecalciferol (VITAMIN D3) 25 MCG (1000 UNIT) tablet Take 2,000 Units by mouth daily.  . Cyanocobalamin (B-12 PO) Take by mouth.  . EUTHYROX 150 MCG tablet TAKE 1 TABLET BY MOUTH ONCE DAILY BEFORE BREAKFAST  . hydrochlorothiazide (MICROZIDE) 12.5 MG  capsule Take 1 capsule (12.5 mg total) by mouth daily.  Marland Kitchen lisinopril (ZESTRIL) 10 MG tablet Take 1 tablet by mouth once daily  . rosuvastatin (CRESTOR) 5 MG tablet TAKE 1 TABLET BY MOUTH  DAILY  . Semaglutide-Weight Management (WEGOVY) 0.25 MG/0.5ML SOAJ Inject 0.5 mLs (0.25 mg total) into the skin once a week.  . Semaglutide-Weight Management (WEGOVY) 0.5 MG/0.5ML SOAJ Inject 0.5 mLs (0.5 mg total) into the skin once a week.  . Semaglutide-Weight Management (WEGOVY) 1 MG/0.5ML SOAJ Inject 0.5 mLs (1 mg total) into the skin once a week.   No facility-administered encounter medications on file as of 07/05/2020.    Review of Systems  Constitutional: Negative for appetite change and unexpected weight change.  HENT: Negative for congestion and sinus pressure.   Respiratory: Negative for cough, chest tightness and shortness of breath.   Cardiovascular: Negative for chest pain, palpitations and leg swelling.  Gastrointestinal: Negative for abdominal pain, diarrhea, nausea and vomiting.  Genitourinary: Negative for difficulty urinating and dysuria.  Musculoskeletal: Negative for joint swelling and myalgias.  Skin: Negative for color change and rash.  Neurological: Negative for dizziness, light-headedness and headaches.  Psychiatric/Behavioral: Negative for agitation and dysphoric mood.       Objective:    Physical Exam Vitals reviewed.  Constitutional:      General: She is not in acute distress.    Appearance: Normal appearance.  HENT:     Head: Normocephalic and atraumatic.     Right Ear: External ear normal.     Left Ear: External ear normal.  Eyes:     General: No scleral icterus.       Right eye: No discharge.        Left eye: No discharge.     Conjunctiva/sclera: Conjunctivae normal.  Neck:     Thyroid: No thyromegaly.  Cardiovascular:     Rate and Rhythm: Normal rate and regular rhythm.  Pulmonary:     Effort: No respiratory distress.     Breath sounds: Normal breath  sounds. No wheezing.  Abdominal:     General: Bowel sounds are normal.     Palpations: Abdomen is soft.     Tenderness: There is no abdominal tenderness.  Musculoskeletal:        General: No swelling or tenderness.     Cervical back: Neck supple. No tenderness.  Lymphadenopathy:     Cervical: No cervical adenopathy.  Skin:    Findings: No erythema or rash.  Neurological:     Mental  Status: She is alert.  Psychiatric:        Mood and Affect: Mood normal.        Behavior: Behavior normal.     BP 118/72   Pulse (!) 50   Temp 98.3 F (36.8 C) (Oral)   Resp 16   Ht '5\' 7"'  (1.702 m)   Wt 207 lb (93.9 kg)   SpO2 99%   BMI 32.42 kg/m  Wt Readings from Last 3 Encounters:  07/05/20 207 lb (93.9 kg)  02/23/20 203 lb (92.1 kg)  09/23/19 204 lb 9.6 oz (92.8 kg)     Lab Results  Component Value Date   WBC 4.9 07/05/2020   HGB 12.3 07/05/2020   HCT 37.7 07/05/2020   PLT 281 07/05/2020   GLUCOSE 86 07/05/2020   CHOL 200 (H) 07/05/2020   TRIG 74 07/05/2020   HDL 63 07/05/2020   LDLDIRECT 178.3 11/11/2013   LDLCALC 124 (H) 07/05/2020   ALT 27 07/05/2020   AST 19 07/05/2020   NA 137 07/05/2020   K 3.9 07/05/2020   CL 100 07/05/2020   CREATININE 0.99 07/05/2020   BUN 14 07/05/2020   CO2 24 07/05/2020   TSH 2.00 09/20/2019   HGBA1C 5.6 07/05/2020    MM 3D SCREEN BREAST BILATERAL  Result Date: 05/10/2020 CLINICAL DATA:  Screening. EXAM: DIGITAL SCREENING BILATERAL MAMMOGRAM WITH TOMO AND CAD COMPARISON:  Previous exam(s). ACR Breast Density Category b: There are scattered areas of fibroglandular density. FINDINGS: There are no findings suspicious for malignancy. Images were processed with CAD. IMPRESSION: No mammographic evidence of malignancy. A result letter of this screening mammogram will be mailed directly to the patient. RECOMMENDATION: Screening mammogram in one year. (Code:SM-B-01Y) BI-RADS CATEGORY  1: Negative. Electronically Signed   By: Lillia Mountain M.D.   On:  05/10/2020 13:36       Assessment & Plan:   Problem List Items Addressed This Visit    Status post laparoscopic sleeve gastrectomy    Started wegovy.  Tolerating.  Discussed diet and exercise.  Follow.  Check cbc and iron studies.        Mild depression ([place])    Doing well.  On wellbutrin.  Follow.       Hypothyroidism    On thyroid replacement.  Follow tsh.       Hyperglycemia    Low carb diet and exercise.  Follow met b and a1c.       Relevant Orders   Hemoglobin A1c (Completed)   Hypercholesterolemia    On crestor.  Low cholesterol diet and exercise. Follow lipid panel and liver function tests.        Relevant Orders   Comprehensive metabolic panel (Completed)   Lipid panel (Completed)   Essential hypertension, benign    Blood pressure doing well on lisinopril and hctz.  Follow pressures.  Follow metabolic panel.        Cervical cancer screening    Pap smear today.        Relevant Orders   Cytology - PAP( [place]) (Completed)   Anemia - Primary    Check cbc and iron studies.        Relevant Orders   CBC with Differential/Platelet (Completed)   Iron and TIBC([facility]) (Completed)       Einar Pheasant, MD

## 2020-07-06 LAB — CBC WITH DIFFERENTIAL/PLATELET
Basophils Absolute: 0 10*3/uL (ref 0.0–0.2)
Basos: 1 %
EOS (ABSOLUTE): 0.1 10*3/uL (ref 0.0–0.4)
Eos: 1 %
Hematocrit: 37.7 % (ref 34.0–46.6)
Hemoglobin: 12.3 g/dL (ref 11.1–15.9)
Immature Grans (Abs): 0 10*3/uL (ref 0.0–0.1)
Immature Granulocytes: 0 %
Lymphocytes Absolute: 1.6 10*3/uL (ref 0.7–3.1)
Lymphs: 33 %
MCH: 24.8 pg — ABNORMAL LOW (ref 26.6–33.0)
MCHC: 32.6 g/dL (ref 31.5–35.7)
MCV: 76 fL — ABNORMAL LOW (ref 79–97)
Monocytes Absolute: 0.5 10*3/uL (ref 0.1–0.9)
Monocytes: 11 %
Neutrophils Absolute: 2.6 10*3/uL (ref 1.4–7.0)
Neutrophils: 54 %
Platelets: 281 10*3/uL (ref 150–450)
RBC: 4.96 x10E6/uL (ref 3.77–5.28)
RDW: 12.8 % (ref 11.7–15.4)
WBC: 4.9 10*3/uL (ref 3.4–10.8)

## 2020-07-06 LAB — LIPID PANEL
Chol/HDL Ratio: 3.2 ratio (ref 0.0–4.4)
Cholesterol, Total: 200 mg/dL — ABNORMAL HIGH (ref 100–199)
HDL: 63 mg/dL (ref >39)
LDL Chol Calc (NIH): 124 mg/dL — ABNORMAL HIGH (ref 0–99)
Triglycerides: 74 mg/dL (ref 0–149)
VLDL Cholesterol Cal: 13 mg/dL (ref 5–40)

## 2020-07-06 LAB — COMPREHENSIVE METABOLIC PANEL
ALT: 27 IU/L (ref 0–32)
AST: 19 IU/L (ref 0–40)
Albumin/Globulin Ratio: 1.8 (ref 1.2–2.2)
Albumin: 4.6 g/dL (ref 3.8–4.8)
Alkaline Phosphatase: 38 IU/L — ABNORMAL LOW (ref 44–121)
BUN/Creatinine Ratio: 14 (ref 9–23)
BUN: 14 mg/dL (ref 6–24)
Bilirubin Total: 0.3 mg/dL (ref 0.0–1.2)
CO2: 24 mmol/L (ref 20–29)
Calcium: 9.5 mg/dL (ref 8.7–10.2)
Chloride: 100 mmol/L (ref 96–106)
Creatinine, Ser: 0.99 mg/dL (ref 0.57–1.00)
GFR calc Af Amer: 79 mL/min/{1.73_m2} (ref >59)
GFR calc non Af Amer: 69 mL/min/{1.73_m2} (ref >59)
Globulin, Total: 2.6 g/dL (ref 1.5–4.5)
Glucose: 86 mg/dL (ref 65–99)
Potassium: 3.9 mmol/L (ref 3.5–5.2)
Sodium: 137 mmol/L (ref 134–144)
Total Protein: 7.2 g/dL (ref 6.0–8.5)

## 2020-07-06 LAB — IRON AND TIBC
Iron Saturation: 31 % (ref 15–55)
Iron: 89 ug/dL (ref 27–159)
Total Iron Binding Capacity: 286 ug/dL (ref 250–450)
UIBC: 197 ug/dL (ref 131–425)

## 2020-07-06 LAB — HEMOGLOBIN A1C
Est. average glucose Bld gHb Est-mCnc: 114 mg/dL
Hgb A1c MFr Bld: 5.6 % (ref 4.8–5.6)

## 2020-07-07 LAB — CYTOLOGY - PAP
Comment: NEGATIVE
Diagnosis: NEGATIVE
Diagnosis: REACTIVE
High risk HPV: NEGATIVE

## 2020-07-16 ENCOUNTER — Encounter: Payer: Self-pay | Admitting: Internal Medicine

## 2020-07-16 NOTE — Assessment & Plan Note (Signed)
Doing well.  On wellbutrin.  Follow.

## 2020-07-16 NOTE — Assessment & Plan Note (Signed)
Low carb diet and exercise.  Follow met b and a1c.  

## 2020-07-16 NOTE — Assessment & Plan Note (Signed)
Started [employer].  Tolerating.  Discussed diet and exercise.  Follow.  Check cbc and iron studies.

## 2020-07-16 NOTE — Assessment & Plan Note (Signed)
Check cbc and iron studies.   

## 2020-07-16 NOTE — Assessment & Plan Note (Signed)
On crestor.  Low cholesterol diet and exercise.  Follow lipid panel and liver function tests.   

## 2020-07-16 NOTE — Assessment & Plan Note (Signed)
On thyroid replacement.  Follow tsh.  

## 2020-07-16 NOTE — Assessment & Plan Note (Signed)
Blood pressure doing well on lisinopril and hctz.  Follow pressures.  Follow metabolic panel.  

## 2020-07-16 NOTE — Assessment & Plan Note (Signed)
Pap smear today. 

## 2020-07-20 ENCOUNTER — Telehealth: Payer: Self-pay

## 2020-07-20 NOTE — Telephone Encounter (Signed)
Pt dropped off wellness screening appeal form to be completed. I placed form in colored folder for Dr. Lorin Picket in front office.

## 2020-07-21 NOTE — Telephone Encounter (Signed)
My chart sent to patient. Form placed up front. °

## 2020-08-13 ENCOUNTER — Other Ambulatory Visit: Payer: Self-pay | Admitting: Internal Medicine

## 2020-08-16 ENCOUNTER — Ambulatory Visit: Payer: Managed Care, Other (non HMO) | Admitting: Pharmacist

## 2020-08-16 VITALS — Wt 203.0 lb

## 2020-08-16 DIAGNOSIS — E7800 Pure hypercholesterolemia, unspecified: Secondary | ICD-10-CM

## 2020-08-16 DIAGNOSIS — R739 Hyperglycemia, unspecified: Secondary | ICD-10-CM

## 2020-08-16 DIAGNOSIS — Z6831 Body mass index (BMI) 31.0-31.9, adult: Secondary | ICD-10-CM

## 2020-08-16 MED ORDER — SEMAGLUTIDE-WEIGHT MANAGEMENT 2.4 MG/0.75ML ~~LOC~~ SOAJ: 2.4000 mg | SUBCUTANEOUS | 1 refills | Status: DC

## 2020-08-16 MED ORDER — SEMAGLUTIDE-WEIGHT MANAGEMENT 1.7 MG/0.75ML ~~LOC~~ SOAJ: 1.7000 mg | SUBCUTANEOUS | 0 refills | Status: DC

## 2020-08-16 MED ORDER — ROSUVASTATIN CALCIUM 10 MG PO TABS: 10.0000 mg | ORAL_TABLET | Freq: Every day | ORAL | 3 refills | Status: DC

## 2020-08-16 NOTE — Chronic Care Management (AMB) (Signed)
Chronic Care Management   Follow Up Note   08/16/2020 Name: [name] Nielsen MRN: [id number] DOB: 12/24/1975  Referred by: Dale [employer], MD Reason for referral : Chronic Care Management (Medication Management)   [name] Nielsen is a 44 y.o. year old female who is a primary care patient of Dale Axis, MD. The CCM team was consulted for assistance with chronic disease management and care coordination needs.    Contacted patient for medication management review.  Review of patient status, including review of consultants reports, relevant laboratory and other test results, and collaboration with appropriate care team members and the patient's provider was performed as part of comprehensive patient evaluation and provision of chronic care management services.    SDOH (Social Determinants of Health) assessments performed: Yes See Care Plan activities for detailed interventions related to SDOH)  SDOH Interventions     Most Recent Value  SDOH Interventions  Physical Activity Interventions Intervention Not Indicated       Outpatient Encounter Medications as of 08/16/2020  Medication Sig  . Semaglutide-Weight Management (WEGOVY) 1 MG/0.5ML SOAJ Inject 0.5 mLs (1 mg total) into the skin once a week.  Marland Kitchen acetaminophen (TYLENOL) 500 MG tablet Take 1,000 mg by mouth every 6 (six) hours as needed for mild pain or moderate pain.  Marland Kitchen ALPRAZolam (XANAX) 0.25 MG tablet Take 1 tablet (0.25 mg total) by mouth daily as needed for anxiety. (Patient not taking: Reported on 05/26/2020)  . buPROPion (WELLBUTRIN XL) 150 MG 24 hr tablet Take 1 tablet by mouth once daily  . cholecalciferol (VITAMIN D3) 25 MCG (1000 UNIT) tablet Take 2,000 Units by mouth daily.  . Cyanocobalamin (B-12 PO) Take by mouth.  . EUTHYROX 150 MCG tablet TAKE 1 TABLET BY MOUTH ONCE DAILY BEFORE BREAKFAST  . hydrochlorothiazide (MICROZIDE) 12.5 MG capsule Take 1 capsule (12.5 mg total) by mouth daily.  Marland Kitchen lisinopril (ZESTRIL) 10 MG  tablet Take 1 tablet by mouth once daily  . rosuvastatin (CRESTOR) 10 MG tablet Take 1 tablet (10 mg total) by mouth daily.  Melene Muller ON 11/11/2020] Semaglutide-Weight Management 1.7 MG/0.75ML SOAJ Inject 1.7 mg into the skin once a week for 28 days.  Melene Muller ON 12/10/2020] Semaglutide-Weight Management 2.4 MG/0.75ML SOAJ Inject 2.4 mg into the skin once a week for 28 days.  . [DISCONTINUED] rosuvastatin (CRESTOR) 5 MG tablet TAKE 1 TABLET BY MOUTH  DAILY  . [DISCONTINUED] Semaglutide-Weight Management (WEGOVY) 0.25 MG/0.5ML SOAJ Inject 0.5 mLs (0.25 mg total) into the skin once a week.  . [DISCONTINUED] Semaglutide-Weight Management (WEGOVY) 0.5 MG/0.5ML SOAJ Inject 0.5 mLs (0.5 mg total) into the skin once a week.   No facility-administered encounter medications on file as of 08/16/2020.     Objective:   Goals Addressed              This Visit's Progress     Patient Stated   .  PharmD "I want to work on weight loss" (pt-stated)        CARE PLAN ENTRY (see longitudinal plan of care for additional care plan information)  Current Barriers:  . Obesity, complicated by HTN, HLD, pre-diabetes . Most recent 203 lbs; baseline 207 lbs . Current weight management pharmacotherapy: Wegovy 1 mg weekly - has taken 1 dose. Reports headache when first starting medication, but resolved. Denied any impact on appetite w/ 0.25 and 0.5 mg doses, but does report a decreased appetite w/ 1 mg dose.  o Hx phentermine therapy - successful during therapy, but gained weight back.  Retrial of phentermine inappropriate given HTN, other phentermine-containing regimens also inappropriate. . Breakfast: coffee . Lunch: salad, chicken strips + vegetables; avoiding carb sides; burrito bowl yesterday . Supper: leftovers,  . Drink: water, coffee, unsweet tea . Exercise: 6 days per week, yoga, weights three days per week, HIIT training  . Pre-DM: diet controlled, last A1c 5.6% . HTN: lisinopril 10 mg daily, HCTZ 12.5 mg  daily . HLD: rosuvastatin 5 mg daily; last LDL not at goal <100 consider increasing to 10 mg daily . Hypothyroidism: Euthyrox 150 mcg daily  . Depression/anxiety: bupropion XL 150 mg daily  Pharmacist Clinical Goal(s):  Marland Kitchen Over the next 90 days, patient will work with PharmD and primary care provider to work towards 5-10% body weight loss  Interventions: . Comprehensive medication review performed, medication list updated in electronic medical record . Inter-disciplinary care team collaboration (see longitudinal plan of care) . Praised for adherence to therapy. Continue Wegovy 1 mg weekly x 4 weeks, then increase to 1.7 mg x 4 weeks, then increase to 2.4 mg weekly thereafter.  . Encouraged continued focus on reduction in carbohydrate portion sizes.  . Discussed a more stringent LDL goal for primary prevention. Patient amenable to increasing rosuvastatin to 10 mg daily. Script sent.   Patient Self Care Activities:  . Patient will adhere to dietary modifications . Patient will target at least 150 minutes of moderate intensity exercise weekly . Patient will report any questions or concerns to provider   Please see past updates related to this goal by clicking on the "Past Updates" button in the selected goal          Plan:  - Scheduled f/u call in ~ 12 weeks (~4 weeks after next PCP appt)  Catie Feliz Beam, PharmD, [place], CPP Clinical Pharmacist [facility] [employer] [phone]

## 2020-08-16 NOTE — Patient Instructions (Signed)
Visit Information  Goals Addressed              This Visit's Progress     Patient Stated   .  PharmD "I want to work on weight loss" (pt-stated)        CARE PLAN ENTRY (see longitudinal plan of care for additional care plan information)  Current Barriers:  . Obesity, complicated by HTN, HLD, pre-diabetes . Most recent 203 lbs; baseline 207 lbs . Current weight management pharmacotherapy: Wegovy 1 mg weekly - has taken 1 dose. Reports headache when first starting medication, but resolved. Denied any impact on appetite w/ 0.25 and 0.5 mg doses, but does report a decreased appetite w/ 1 mg dose.  o Hx phentermine therapy - successful during therapy, but gained weight back. Retrial of phentermine inappropriate given HTN, other phentermine-containing regimens also inappropriate. . Breakfast: coffee . Lunch: salad, chicken strips + vegetables; avoiding carb sides; burrito bowl yesterday . Supper: leftovers,  . Drink: water, coffee, unsweet tea . Exercise: 6 days per week, yoga, weights three days per week, HIIT training  . Pre-DM: diet controlled, last A1c 5.6% . HTN: lisinopril 10 mg daily, HCTZ 12.5 mg daily . HLD: rosuvastatin 5 mg daily; last LDL not at goal <100 consider increasing to 10 mg daily . Hypothyroidism: Euthyrox 150 mcg daily  . Depression/anxiety: bupropion XL 150 mg daily  Pharmacist Clinical Goal(s):  Marland Kitchen Over the next 90 days, patient will work with PharmD and primary care provider to work towards 5-10% body weight loss  Interventions: . Comprehensive medication review performed, medication list updated in electronic medical record . Inter-disciplinary care team collaboration (see longitudinal plan of care) . Praised for adherence to therapy. Continue Wegovy 1 mg weekly x 4 weeks, then increase to 1.7 mg x 4 weeks, then increase to 2.4 mg weekly thereafter.  . Encouraged continued focus on reduction in carbohydrate portion sizes.  . Discussed a more stringent LDL  goal for primary prevention. Patient amenable to increasing rosuvastatin to 10 mg daily. Script sent.   Patient Self Care Activities:  . Patient will adhere to dietary modifications . Patient will target at least 150 minutes of moderate intensity exercise weekly . Patient will report any questions or concerns to provider   Please see past updates related to this goal by clicking on the "Past Updates" button in the selected goal         The patient verbalized understanding of instructions provided today and declined a print copy of patient instruction materials.    Plan:  - Scheduled f/u call in ~ 12 weeks (~4 weeks after next PCP appt)  Catie Feliz Beam, PharmD, [place], CPP Clinical Pharmacist [facility] [employer] [phone]

## 2020-08-31 ENCOUNTER — Other Ambulatory Visit: Payer: Self-pay | Admitting: Internal Medicine

## 2020-09-03 ENCOUNTER — Other Ambulatory Visit: Payer: Self-pay | Admitting: Internal Medicine

## 2020-09-11 ENCOUNTER — Encounter: Payer: Self-pay | Admitting: Internal Medicine

## 2020-09-12 MED ORDER — ALPRAZOLAM 0.25 MG PO TABS: 0.2500 mg | ORAL_TABLET | Freq: Every day | ORAL | 0 refills | Status: DC | PRN

## 2020-09-12 NOTE — Telephone Encounter (Signed)
rx sent in for xanax #30 with no refills.   

## 2020-11-01 ENCOUNTER — Encounter: Payer: Self-pay | Admitting: Internal Medicine

## 2020-11-06 ENCOUNTER — Other Ambulatory Visit: Payer: Self-pay

## 2020-11-06 ENCOUNTER — Ambulatory Visit (INDEPENDENT_AMBULATORY_CARE_PROVIDER_SITE_OTHER): Payer: Managed Care, Other (non HMO) | Admitting: Internal Medicine

## 2020-11-06 ENCOUNTER — Encounter: Payer: Self-pay | Admitting: Internal Medicine

## 2020-11-06 VITALS — BP 112/70 | HR 71 | Temp 98.3°F | Resp 16 | Ht 67.0 in | Wt 191.0 lb

## 2020-11-06 DIAGNOSIS — I10 Essential (primary) hypertension: Secondary | ICD-10-CM

## 2020-11-06 DIAGNOSIS — F320 Major depressive disorder, single episode, mild: Secondary | ICD-10-CM

## 2020-11-06 DIAGNOSIS — Z1159 Encounter for screening for other viral diseases: Secondary | ICD-10-CM

## 2020-11-06 DIAGNOSIS — E7800 Pure hypercholesterolemia, unspecified: Secondary | ICD-10-CM

## 2020-11-06 DIAGNOSIS — F32A Depression, unspecified: Secondary | ICD-10-CM

## 2020-11-06 DIAGNOSIS — E039 Hypothyroidism, unspecified: Secondary | ICD-10-CM

## 2020-11-06 DIAGNOSIS — R739 Hyperglycemia, unspecified: Secondary | ICD-10-CM

## 2020-11-06 DIAGNOSIS — R945 Abnormal results of liver function studies: Secondary | ICD-10-CM

## 2020-11-06 DIAGNOSIS — R5383 Other fatigue: Secondary | ICD-10-CM | POA: Diagnosis not present

## 2020-11-06 DIAGNOSIS — Z9884 Bariatric surgery status: Secondary | ICD-10-CM

## 2020-11-06 DIAGNOSIS — Z1211 Encounter for screening for malignant neoplasm of colon: Secondary | ICD-10-CM

## 2020-11-06 DIAGNOSIS — D649 Anemia, unspecified: Secondary | ICD-10-CM

## 2020-11-06 DIAGNOSIS — R7989 Other specified abnormal findings of blood chemistry: Secondary | ICD-10-CM

## 2020-11-06 NOTE — Progress Notes (Signed)
Patient ID: [name] Nielsen, female   DOB: May 14, 1976, 45 y.o.   MRN: [id number]   Subjective:    Patient ID: [name] Nielsen, female    DOB: 10-Apr-1976, 45 y.o.   MRN: [id number]  HPI This visit occurred during the SARS-CoV-2 public health emergency.  Safety protocols were in place, including screening questions prior to the visit, additional usage of staff PPE, and extensive cleaning of exam room while observing appropriate contact time as indicated for disinfecting solutions.  Patient here for a scheduled follow up. Follow up regarding her blood pressure, weight loss and stress.  She reports she is doing relatively well.  Reviewed depression screening.  Some increased fatigue and little interest in doing things. Discussed.  No suicidal ideations. She is scheduled to see a counselor today [name] Nielsen - [facility].  Does not feel needs any further intervention.  Tries to stay active.  Is exercising.  No chest pain or sob reported.  No abdominal pain.  Bowels stable - history of constipation.    Scheduled for evaluation [facility] - 12/19/20.  Declines flu vaccine and covid vaccine. Will notify me if questions regarding vaccine.  Doing well with Wegovy.  Has lost weight.   Past Medical History:  Diagnosis Date  . Anemia    hx of   . Anxiety   . Depression   . Frequent headaches    hx of  . Heart murmur   . Hx: UTI (urinary tract infection)    child, s/p urinary procecure  . Hypercholesterolemia   . Hypertension   . Hypothyroidism    Past Surgical History:  Procedure Laterality Date  . CHOLECYSTECTOMY  2012  . LAPAROSCOPIC GASTRIC SLEEVE RESECTION N/A 07/05/2014   Procedure: LAPAROSCOPIC GASTRIC SLEEVE RESECTION with upper endoscopy;  Surgeon: Kaylyn Lim, MD;  Location: [facility];  Service: General;  Laterality: N/A;  . TUBAL LIGATION  1999   Family History  Problem Relation Age of Onset  . Cancer Mother        brain  . Diabetes Father   . Hypertension Father   . Kidney  disease Father   . Stroke Father   . Cancer Father        prostate  . Breast cancer Neg Hx    Social History   Socioeconomic History  . Marital status: Single    Spouse name: Not on file  . Number of children: Not on file  . Years of education: Not on file  . Highest education level: Not on file  Occupational History  . Not on file  Tobacco Use  . Smoking status: Never Smoker  . Smokeless tobacco: Never Used  Substance and Sexual Activity  . Alcohol use: No    Alcohol/week: 0.0 standard drinks  . Drug use: No  . Sexual activity: Not on file  Other Topics Concern  . Not on file  Social History Narrative  . Not on file   Social Determinants of Health   Financial Resource Strain: Low Risk   . Difficulty of Paying Living Expenses: Not hard at all  Food Insecurity: Not on file  Transportation Needs: Not on file  Physical Activity: Sufficiently Active  . Days of Exercise per Week: 6 days  . Minutes of Exercise per Session: 50 min  Stress: Not on file  Social Connections: Not on file    Outpatient Encounter Medications as of 11/06/2020  Medication Sig  . acetaminophen (TYLENOL) 500 MG tablet Take 1,000 mg by mouth  every 6 (six) hours as needed for mild pain or moderate pain.  Marland Kitchen ALPRAZolam (XANAX) 0.25 MG tablet Take 1 tablet (0.25 mg total) by mouth daily as needed for anxiety.  Marland Kitchen buPROPion (WELLBUTRIN XL) 150 MG 24 hr tablet Take 1 tablet by mouth once daily  . cholecalciferol (VITAMIN D3) 25 MCG (1000 UNIT) tablet Take 2,000 Units by mouth daily.  . Cyanocobalamin (B-12 PO) Take by mouth.  . hydrochlorothiazide (MICROZIDE) 12.5 MG capsule Take 1 capsule by mouth once daily  . lisinopril (ZESTRIL) 10 MG tablet Take 1 tablet by mouth once daily  . rosuvastatin (CRESTOR) 10 MG tablet Take 1 tablet (10 mg total) by mouth daily.  Derrill Memo ON 12/10/2020] Semaglutide-Weight Management 2.4 MG/0.75ML SOAJ Inject 2.4 mg into the skin once a week for 28 days.  . [DISCONTINUED]  levothyroxine (EUTHYROX) 150 MCG tablet Take 1 tablet (150 mcg total) by mouth daily before breakfast. Please call office for lab appt to recheck TSH.  . [DISCONTINUED] Semaglutide-Weight Management (WEGOVY) 1 MG/0.5ML SOAJ Inject 0.5 mLs (1 mg total) into the skin once a week.  . [DISCONTINUED] Semaglutide-Weight Management 1.7 MG/0.75ML SOAJ Inject 1.7 mg into the skin once a week for 28 days.   No facility-administered encounter medications on file as of 11/06/2020.    Review of Systems  Constitutional: Positive for fatigue.       Has lost weight.   HENT: Negative for congestion and sinus pressure.   Respiratory: Negative for cough, chest tightness and shortness of breath.   Cardiovascular: Negative for chest pain, palpitations and leg swelling.  Gastrointestinal: Positive for constipation. Negative for abdominal pain, diarrhea and vomiting.  Genitourinary: Negative for difficulty urinating and dysuria.  Musculoskeletal: Negative for joint swelling and myalgias.  Skin: Negative for color change and rash.  Neurological: Negative for dizziness, light-headedness and headaches.  Psychiatric/Behavioral: Negative for agitation and dysphoric mood.       Objective:    Physical Exam Vitals reviewed.  Constitutional:      General: She is not in acute distress.    Appearance: Normal appearance.  HENT:     Head: Normocephalic and atraumatic.     Right Ear: External ear normal.     Left Ear: External ear normal.     Mouth/Throat:     Mouth: Oropharynx is clear and moist.  Eyes:     General: No scleral icterus.       Right eye: No discharge.        Left eye: No discharge.     Conjunctiva/sclera: Conjunctivae normal.  Neck:     Thyroid: No thyromegaly.  Cardiovascular:     Rate and Rhythm: Normal rate and regular rhythm.  Pulmonary:     Effort: No respiratory distress.     Breath sounds: Normal breath sounds. No wheezing.  Abdominal:     General: Bowel sounds are normal.      Palpations: Abdomen is soft.     Tenderness: There is no abdominal tenderness.  Musculoskeletal:        General: No swelling, tenderness or edema.     Cervical back: Neck supple. No tenderness.  Lymphadenopathy:     Cervical: No cervical adenopathy.  Skin:    Findings: No erythema or rash.  Neurological:     Mental Status: She is alert.  Psychiatric:        Mood and Affect: Mood normal.        Behavior: Behavior normal.     BP 112/70  Pulse 71   Temp 98.3 F (36.8 C) (Oral)   Resp 16   Ht '5\' 7"'  (1.702 m)   Wt 191 lb (86.6 kg)   SpO2 99%   BMI 29.91 kg/m  Wt Readings from Last 3 Encounters:  11/06/20 191 lb (86.6 kg)  08/16/20 203 lb (92.1 kg)  07/05/20 207 lb (93.9 kg)     Lab Results  Component Value Date   WBC 5.4 11/06/2020   HGB 12.4 11/06/2020   HCT 37.7 11/06/2020   PLT 291 11/06/2020   GLUCOSE 83 11/06/2020   CHOL 169 11/06/2020   TRIG 89 11/06/2020   HDL 72 11/06/2020   LDLDIRECT 178.3 11/11/2013   LDLCALC 81 11/06/2020   ALT 15 11/06/2020   AST 16 11/06/2020   NA 138 11/06/2020   K 3.6 11/06/2020   CL 100 11/06/2020   CREATININE 1.01 (H) 11/06/2020   BUN 10 11/06/2020   CO2 24 11/06/2020   TSH 0.007 (L) 11/06/2020   HGBA1C 5.3 11/06/2020       Assessment & Plan:   Problem List Items Addressed This Visit    Abnormal liver function test    Has adjusted diet.  Lost weight.  Check liver function tests today.       Anemia    Check cbc and iron studies.        Relevant Orders   CBC with Differential/Platelet (Completed)   Iron and TIBC (Completed)   Ferritin (Completed)   Vitamin B12 (Completed)   Colon cancer screening    Has appt with Gi 12/19/20.       Essential hypertension, benign    On lisinopril and hctz. Blood pressure doing well.  Denies problems of low blood pressures.  Follow pressures.  Follow metabolic panel.       Fatigue    Check cbc and iron studies.  Check met c and tsh. Blood pressures doing well.  Planning to  see counselor today.  Follow.         Hypercholesterolemia    On crestor.  Low cholesterol diet and exercise.  Follow lipid panel and liver function tests.        Relevant Orders   Comprehensive metabolic panel (Completed)   Lipid panel (Completed)   Hyperglycemia    Low carb diet and exercise.  Follow met b and a1c.  Has adjusted diet.  Lost weight.  Follow.       Relevant Orders   Hemoglobin A1c (Completed)   Hypothyroidism    On thyroid replacement.  Follow tsh.        Relevant Orders   TSH (Completed)   Mild depression ([place])    On wellbutrin.  Some fatigue.  Has loss interest in doing some things.  Discussed.  Seeing a counselor today.  No suicidal ideations.  Does not feel needs anything more at this time.  Follow.        Status post laparoscopic sleeve gastrectomy - Primary    Other Visit Diagnoses    Need for hepatitis C screening test       Relevant Orders   Hepatitis C antibody (Completed)       Einar Pheasant, MD

## 2020-11-07 LAB — COMPREHENSIVE METABOLIC PANEL
ALT: 15 IU/L (ref 0–32)
AST: 16 IU/L (ref 0–40)
Albumin/Globulin Ratio: 1.8 (ref 1.2–2.2)
Albumin: 4.9 g/dL — ABNORMAL HIGH (ref 3.8–4.8)
Alkaline Phosphatase: 39 IU/L — ABNORMAL LOW (ref 44–121)
BUN/Creatinine Ratio: 10 (ref 9–23)
BUN: 10 mg/dL (ref 6–24)
Bilirubin Total: 0.4 mg/dL (ref 0.0–1.2)
CO2: 24 mmol/L (ref 20–29)
Calcium: 9.8 mg/dL (ref 8.7–10.2)
Chloride: 100 mmol/L (ref 96–106)
Creatinine, Ser: 1.01 mg/dL — ABNORMAL HIGH (ref 0.57–1.00)
GFR calc Af Amer: 77 mL/min/{1.73_m2} (ref >59)
GFR calc non Af Amer: 66 mL/min/{1.73_m2} (ref >59)
Globulin, Total: 2.7 g/dL (ref 1.5–4.5)
Glucose: 83 mg/dL (ref 65–99)
Potassium: 3.6 mmol/L (ref 3.5–5.2)
Sodium: 138 mmol/L (ref 134–144)
Total Protein: 7.6 g/dL (ref 6.0–8.5)

## 2020-11-07 LAB — CBC WITH DIFFERENTIAL/PLATELET
Basophils Absolute: 0 10*3/uL (ref 0.0–0.2)
Basos: 1 %
EOS (ABSOLUTE): 0.1 10*3/uL (ref 0.0–0.4)
Eos: 1 %
Hematocrit: 37.7 % (ref 34.0–46.6)
Hemoglobin: 12.4 g/dL (ref 11.1–15.9)
Immature Grans (Abs): 0 10*3/uL (ref 0.0–0.1)
Immature Granulocytes: 0 %
Lymphocytes Absolute: 1.5 10*3/uL (ref 0.7–3.1)
Lymphs: 28 %
MCH: 24.6 pg — ABNORMAL LOW (ref 26.6–33.0)
MCHC: 32.9 g/dL (ref 31.5–35.7)
MCV: 75 fL — ABNORMAL LOW (ref 79–97)
Monocytes Absolute: 0.5 10*3/uL (ref 0.1–0.9)
Monocytes: 10 %
Neutrophils Absolute: 3.3 10*3/uL (ref 1.4–7.0)
Neutrophils: 60 %
Platelets: 291 10*3/uL (ref 150–450)
RBC: 5.05 x10E6/uL (ref 3.77–5.28)
RDW: 13.3 % (ref 11.7–15.4)
WBC: 5.4 10*3/uL (ref 3.4–10.8)

## 2020-11-07 LAB — LIPID PANEL
Chol/HDL Ratio: 2.3 ratio (ref 0.0–4.4)
Cholesterol, Total: 169 mg/dL (ref 100–199)
HDL: 72 mg/dL (ref >39)
LDL Chol Calc (NIH): 81 mg/dL (ref 0–99)
Triglycerides: 89 mg/dL (ref 0–149)
VLDL Cholesterol Cal: 16 mg/dL (ref 5–40)

## 2020-11-07 LAB — IRON AND TIBC
Iron Saturation: 17 % (ref 15–55)
Iron: 56 ug/dL (ref 27–159)
Total Iron Binding Capacity: 334 ug/dL (ref 250–450)
UIBC: 278 ug/dL (ref 131–425)

## 2020-11-07 LAB — HEMOGLOBIN A1C
Est. average glucose Bld gHb Est-mCnc: 105 mg/dL
Hgb A1c MFr Bld: 5.3 % (ref 4.8–5.6)

## 2020-11-07 LAB — HEPATITIS C ANTIBODY: Hep C Virus Ab: <0.1 s/co ratio (ref 0.0–0.9)

## 2020-11-07 LAB — FERRITIN: Ferritin: 21 ng/mL (ref 15–150)

## 2020-11-07 LAB — TSH: TSH: 0.007 u[IU]/mL — ABNORMAL LOW (ref 0.450–4.500)

## 2020-11-07 LAB — VITAMIN B12: Vitamin B-12: 1038 pg/mL (ref 232–1245)

## 2020-11-09 ENCOUNTER — Other Ambulatory Visit: Payer: Self-pay

## 2020-11-09 MED ORDER — LEVOTHYROXINE SODIUM 125 MCG PO TABS: 125.0000 ug | ORAL_TABLET | Freq: Every day | ORAL | 1 refills | Status: DC

## 2020-11-12 ENCOUNTER — Encounter: Payer: Self-pay | Admitting: Internal Medicine

## 2020-11-12 NOTE — Assessment & Plan Note (Signed)
Low carb diet and exercise.  Follow met b and a1c.  Has adjusted diet.  Lost weight.  Follow.

## 2020-11-12 NOTE — Assessment & Plan Note (Signed)
On thyroid replacement.  Follow tsh.  

## 2020-11-12 NOTE — Assessment & Plan Note (Signed)
Has appt with Gi 12/19/20.

## 2020-11-12 NOTE — Assessment & Plan Note (Signed)
Check cbc and iron studies.   

## 2020-11-12 NOTE — Assessment & Plan Note (Signed)
On crestor.  Low cholesterol diet and exercise.  Follow lipid panel and liver function tests.   

## 2020-11-12 NOTE — Assessment & Plan Note (Signed)
On wellbutrin.  Some fatigue.  Has loss interest in doing some things.  Discussed.  Seeing a counselor today.  No suicidal ideations.  Does not feel needs anything more at this time.  Follow.

## 2020-11-12 NOTE — Assessment & Plan Note (Signed)
On lisinopril and hctz. Blood pressure doing well.  Denies problems of low blood pressures.  Follow pressures.  Follow metabolic panel.

## 2020-11-12 NOTE — Assessment & Plan Note (Signed)
Has adjusted diet.  Lost weight.  Check liver function tests today.

## 2020-11-12 NOTE — Assessment & Plan Note (Signed)
Check cbc and iron studies.  Check met c and tsh. Blood pressures doing well.  Planning to see counselor today.  Follow.

## 2020-11-14 ENCOUNTER — Ambulatory Visit: Payer: Managed Care, Other (non HMO) | Admitting: Pharmacist

## 2020-11-14 DIAGNOSIS — E7800 Pure hypercholesterolemia, unspecified: Secondary | ICD-10-CM

## 2020-11-14 DIAGNOSIS — Z6829 Body mass index (BMI) 29.0-29.9, adult: Secondary | ICD-10-CM

## 2020-11-14 DIAGNOSIS — Z9884 Bariatric surgery status: Secondary | ICD-10-CM

## 2020-11-14 DIAGNOSIS — F320 Major depressive disorder, single episode, mild: Secondary | ICD-10-CM

## 2020-11-14 DIAGNOSIS — F32A Depression, unspecified: Secondary | ICD-10-CM

## 2020-11-14 DIAGNOSIS — I10 Essential (primary) hypertension: Secondary | ICD-10-CM

## 2020-11-14 MED ORDER — SEMAGLUTIDE-WEIGHT MANAGEMENT 2.4 MG/0.75ML ~~LOC~~ SOAJ: 2.4000 mg | SUBCUTANEOUS | 1 refills | Status: DC

## 2020-11-14 NOTE — Patient Instructions (Signed)
Visit Information  Goals Addressed              This Visit's Progress     Patient Stated   .  PharmD - Medication Monitoring (pt-stated)         Patient Goals/Self-Care Activities . Over the next 90 days, patient will:  - take medications as prescribed target a minimum of 150 minutes of moderate intensity exercise weekly engage in dietary modifications by continuing to monitor portion sizes       Patient verbalizes understanding of instructions provided today and agrees to view in MyChart.  Plan: Telephone follow up appointment with care management team member scheduled for:  ~ 12 weeks  Catie Feliz Beam, PharmD, [place], CPP Clinical Pharmacist [facility] [employer] [phone]

## 2020-11-14 NOTE — Chronic Care Management (AMB) (Signed)
Care Management   Pharmacy Note  11/14/2020 Name: [name] Nielsen MRN: [id number] DOB: 01/23/1976  Subjective: [name] Nielsen is a 45 y.o. year old female who is a primary care patient of Dale Applegate, MD. The Care Management team was consulted for assistance with care management and care coordination needs.    Engaged with patient by telephone for follow up visit in response to provider referral for pharmacy case management and/or care coordination services.   The patient was given information about Care Management services today including:  1. Care Management services includes personalized support from designated clinical staff supervised by the patient's primary care provider, including individualized plan of care and coordination with other care providers. 2. 24/7 contact phone numbers for assistance for urgent and routine care needs. 3. The patient may stop case management services at any time by phone call to the office staff.  Patient agreed to services and consent obtained.  Assessment:  Review of patient status, including review of consultants reports, laboratory and other test data, was performed as part of comprehensive evaluation and provision of chronic care management services.   SDOH (Social Determinants of Health) assessments and interventions performed:  SDOH Interventions   Flowsheet Row Most Recent Value  SDOH Interventions   Physical Activity Interventions Intervention Not Indicated  Stress Interventions Other (Comment)  [patient in therapy]       Objective:  Lab Results  Component Value Date   CREATININE 1.01 (H) 11/06/2020   CREATININE 0.99 07/05/2020   CREATININE 0.99 02/23/2020    Lab Results  Component Value Date   HGBA1C 5.3 11/06/2020       Component Value Date/Time   CHOL 169 11/06/2020 0944   TRIG 89 11/06/2020 0944   HDL 72 11/06/2020 0944   CHOLHDL 2.3 11/06/2020 0944   CHOLHDL 4 09/20/2019 0816   VLDL 16.4 09/20/2019 0816   LDLCALC  81 11/06/2020 0944   LDLDIRECT 178.3 11/11/2013 1059   Lab Results  Component Value Date   TSH 0.007 (L) 11/06/2020   Last vitamin D Lab Results  Component Value Date   VD25OH 57.2 03/10/2020    Clinical ASCVD: No  The 10-year ASCVD risk score Denman George DC Jr., et al., 2013) is: 0.5%   Values used to calculate the score:     Age: 65 years     Sex: Female     Is Non-Hispanic African American: No     Diabetic: No     Tobacco smoker: No     Systolic Blood Pressure: 112 mmHg     Is BP treated: Yes     HDL Cholesterol: 72 mg/dL     Total Cholesterol: 169 mg/dL     BP Readings from Last 3 Encounters:  11/06/20 112/70  07/05/20 118/72  02/23/20 120/78    Care Plan  No Known Allergies  Medications Reviewed Today    Reviewed by Lourena Simmonds, RPH-CPP (Pharmacist) on 11/14/20 at 1410  Med List Status: <None>  Medication Order Taking? Sig Documenting Provider Last Dose Status Informant  acetaminophen (TYLENOL) 500 MG tablet [id number] Yes Take 1,000 mg by mouth every 6 (six) hours as needed for mild pain or moderate pain. [provider] Taking Active Self  ALPRAZolam (XANAX) 0.25 MG tablet [id number] Yes Take 1 tablet (0.25 mg total) by mouth daily as needed for anxiety. Dale [place], MD Taking Active   buPROPion (WELLBUTRIN XL) 150 MG 24 hr tablet [id number] Yes Take 1 tablet by mouth once daily Dale [place], MD Taking  Active   cholecalciferol (VITAMIN D3) 25 MCG (1000 UNIT) tablet [id number] Yes Take 2,000 Units by mouth daily. [provider] Taking Active   Cyanocobalamin (B-12 PO) [id number] Yes Take by mouth. [provider] Taking Active   hydrochlorothiazide (MICROZIDE) 12.5 MG capsule [id number] Yes Take 1 capsule by mouth once daily Dale [place], MD Taking Active   levothyroxine (EUTHYROX) 125 MCG tablet [id number] Yes Take 1 tablet (125 mcg total) by mouth daily before breakfast. Please call office for lab appt to recheck TSH. Dale [place], MD Taking Active   lisinopril (ZESTRIL) 10 MG tablet [id number] Yes Take 1 tablet by mouth once daily Dale [place], MD Taking Active   rosuvastatin (CRESTOR) 10 MG tablet [id number] Yes Take 1 tablet (10 mg total) by mouth daily. Dale [place], MD Taking Active   Semaglutide-Weight Management 2.4 MG/0.75ML [place] [id number] Yes Inject 2.4 mg into the skin once a week for 28 days. Dale [place], MD Taking Active   Med List Note Sarita [place], [place] 08/25/18 1118): Order Labs for [facility]          Patient Active Problem List   Diagnosis Date Noted  . Fatigue 11/06/2020  . Cervical cancer screening 07/05/2020  . Colon cancer screening 03/06/2020  . Eye swelling 03/06/2020  . Hypokalemia 09/26/2019  . Hyperglycemia 05/29/2019  . Pelvic pressure in female 05/02/2019  . Heavy menses 10/29/2018  . Right elbow pain 09/24/2016  . Arm swelling 12/25/2015  . Mild depression ([facility]) 07/10/2015  . Insomnia 07/10/2015  . Poor concentration 01/22/2015  . Health care maintenance 11/20/2014  . UTI (urinary tract infection) 08/27/2014  . 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 Abnormal liver function test 02/06/2014  . BMI 31.0-31.9,adult 10/06/2013  . Anemia 07/05/2013  . Headache(784.0) 05/05/2013  . Essential hypertension, benign 05/05/2013  . Hypercholesterolemia 05/05/2013  . Hypothyroidism 05/05/2013    Conditions to be addressed/monitored: HTN, HLD and Obesity  Care Plan : Medication Management  Updates made by Lourena Simmonds, RPH-CPP since 11/14/2020 12:00 AM    Problem: Obesity, HTN, HLD, Hypothyroidism     Long-Range Goal: Disease Progression Prevention   This Visit's Progress: On track  Priority: High  Note:   Current Barriers:  . Unable to achieve control of weight   Pharmacist Clinical Goal(s):  Marland Kitchen Over the next 90 days, patient will achieve 5-10% reduction in weight from baseline through collaboration with PharmD and provider.    Interventions: . 1:1 collaboration with Dale [place], MD regarding development and update of comprehensive plan of care as evidenced by provider attestation and co-signature . Inter-disciplinary care team collaboration (see longitudinal plan of care) . Comprehensive medication review performed; medication list updated in electronic medical record  Obesity, hx bariatric surgery: . Improved through combination of lifestyle modification and therapy; current treatment: Wegovy 2.4 mg weekly . Baseline weight pre-therapy: 207 lbs; most recent home weight: 185 lbs; 22 lbs loss, 11% reduction in weight . Medications previously tried: successful during therapy, but gained weight back. Retrial of phentermine inappropriate given HTN, other phentermine-containing regimens also inappropriate. . Current meal patterns: breakfast: coffee, sometimes protein shake/snack;  lunch: salad, chicken strips + vegetables; avoiding carb sides; dinner: leftovers; drinks: water, coffee, unsweet tea . Current exercise: 6 days per week, yoga, weights three days per week, HIIT training; added in running 3 times weekly over the past 2 months . Praised for successful achievement of goal of loss of at least 10% of baseline body weight. Discussed evidenced based outcomes  with this degree of weight loss, including reduction in ASCVD risk and prevention of progression to diabetes. Appropriate to continue therapy to maintain weight loss. Recommend to continue Wegovy 2.4 mg weekly.  . Discussed principals of endogenous metabolic pathways of weight maintenance, and need to continue to adjust diet/exercise to avoid plateau/regain. Praised for incorporation of running to help with further weight loss. Praised for continued focus on diet and physical activity  Hypertension: . Controlled; current treatment: lisinopril 10 mg daily, HCTZ 12.5 mg daily . Current home readings: 100-110/70s . Reports hypotensive symptoms occasionally, though  admits she doesn't focus on hydration as much as she should . Reviewed to focus on adequate hydration. If hypotensive symptoms persist, contact us to discuss reduction in antihypertensive medications. Discussed holding HCTZ, but patient notes she would prefer to maintain HCTZ as she experiences mild edema when HCTZ is discontinued. If reduction in regimen needed, recommend reducing lisinopril to 5 mg daily.   Hyperlipidemia: . Controlled; current treatment: rosuvastatin 10 mg daily   . Recommended to continue current regimen at this time  Depression/Anxiety . Worsened recently, patient reported more palpitations and believes to be related to anxiety, but most recent oversuppressed TSH may have been the cause. Current treatment: bupropion XL 150 mg daily . Follows w/ therapist in [place] . Continue current regimen at this time. Follow for relief of symptoms as lower dose of levothyroxine takes effect  Hypothyroidism: . Uncontrolled per last lab work; reduced from levothyroxine 150 mcg daily to 125 mcg daily. Recent weight loss appears to have reduced required thyroid supplementation.  . Continue to follow for resolution of symptoms. Repeat TSH scheduled and ordered.   Supplements: Marland Kitchen Vitamin D, Vitamin B12; appropriate to continue current regimen at this time  Patient Goals/Self-Care Activities . Over the next 90 days, patient will:  - take medications as prescribed target a minimum of 150 minutes of moderate intensity exercise weekly engage in dietary modifications by continuing to monitor portion sizes  Follow Up Plan: Telephone follow up appointment with care management team member scheduled for: ~ 12 weeks      Medication Assistance:  None required.  Patient affirms current coverage meets needs.  Follow Up:  Patient agrees to Care Plan and Follow-up.  Plan: Telephone follow up appointment with care management team member scheduled for:  ~ 12 weeks  Catie Feliz Beam, PharmD,  [place], CPP Clinical Pharmacist [facility] [employer] [phone]

## 2020-11-19 ENCOUNTER — Other Ambulatory Visit: Payer: Self-pay | Admitting: Internal Medicine

## 2020-12-05 ENCOUNTER — Ambulatory Visit (AMBULATORY_SURGERY_CENTER): Payer: Self-pay | Admitting: *Deleted

## 2020-12-05 ENCOUNTER — Other Ambulatory Visit: Payer: Self-pay

## 2020-12-05 VITALS — Ht 67.0 in | Wt 184.8 lb

## 2020-12-05 DIAGNOSIS — Z1211 Encounter for screening for malignant neoplasm of colon: Secondary | ICD-10-CM

## 2020-12-05 MED ORDER — PLENVU 140 G PO SOLR: 1.0000 | ORAL | 0 refills | Status: DC

## 2020-12-05 NOTE — Progress Notes (Signed)
No egg or soy allergy known to patient  No issues with past sedation with any surgeries or procedures No intubation problems in the past  No FH of Malignant Hyperthermia No diet pills per patient No home 02 use per patient  No blood thinners per patient  Pt states issues with constipation - has hard Bm's - has a BM once  A week- will do a 2 day prep  No A fib or A flutter  EMMI video to pt or via MyChart  COVID 19 guidelines implemented in [facility] today with Pt and RN    Plenvu  Coupon given to pt in [facility] today , Code to Pharmacy and  NO PA's for preps discussed with pt In [facility] today  Discussed with pt there will be an out-of-pocket cost for prep and that varies from $0 to 70 dollars - pt had gastric sleeve and choose Plenvu due to low volume   Due to the COVID-19 pandemic we are asking patients to follow certain guidelines.  Pt aware of COVID protocols and [facility] guidelines

## 2020-12-08 ENCOUNTER — Encounter: Payer: Self-pay | Admitting: Internal Medicine

## 2020-12-19 ENCOUNTER — Telehealth: Payer: Self-pay | Admitting: *Deleted

## 2020-12-19 ENCOUNTER — Other Ambulatory Visit: Payer: Self-pay

## 2020-12-19 ENCOUNTER — Encounter: Payer: Self-pay | Admitting: Internal Medicine

## 2020-12-19 ENCOUNTER — Ambulatory Visit (AMBULATORY_SURGERY_CENTER): Payer: Managed Care, Other (non HMO) | Admitting: Internal Medicine

## 2020-12-19 VITALS — BP 95/71 | HR 76 | Temp 97.8°F | Resp 24 | Ht 67.0 in | Wt 184.8 lb

## 2020-12-19 DIAGNOSIS — D649 Anemia, unspecified: Secondary | ICD-10-CM

## 2020-12-19 DIAGNOSIS — E7800 Pure hypercholesterolemia, unspecified: Secondary | ICD-10-CM

## 2020-12-19 DIAGNOSIS — R945 Abnormal results of liver function studies: Secondary | ICD-10-CM

## 2020-12-19 DIAGNOSIS — I10 Essential (primary) hypertension: Secondary | ICD-10-CM

## 2020-12-19 DIAGNOSIS — Z1211 Encounter for screening for malignant neoplasm of colon: Secondary | ICD-10-CM | POA: Diagnosis present

## 2020-12-19 DIAGNOSIS — R739 Hyperglycemia, unspecified: Secondary | ICD-10-CM

## 2020-12-19 DIAGNOSIS — R7989 Other specified abnormal findings of blood chemistry: Secondary | ICD-10-CM

## 2020-12-19 MED ORDER — SODIUM CHLORIDE 0.9 % IV SOLN: 500.0000 mL | Freq: Once | INTRAVENOUS | Status: DC

## 2020-12-19 NOTE — Op Note (Signed)
[facility] Patient Name: [name] Nielsen Procedure Date: 12/19/2020 1:39 PM MRN: [id number] Endoscopist: Iva Boop , MD Age: 45 Referring MD:  Date of Birth: [birth year] Gender: Female Account #: [id number] Procedure:                Colonoscopy Indications:              Screening for colorectal malignant neoplasm Medicines:                Propofol per Anesthesia, Monitored Anesthesia Care Procedure:                Pre-Anesthesia Assessment:                           - Prior to the procedure, a History and Physical                            was performed, and patient medications and                            allergies were reviewed. The patient's tolerance of                            previous anesthesia was also reviewed. The risks                            and benefits of the procedure and the sedation                            options and risks were discussed with the patient.                            All questions were answered, and informed consent                            was obtained. Prior Anticoagulants: The patient has                            taken no previous anticoagulant or antiplatelet                            agents. [ASA Grade]. After reviewing the risks and                            benefits, the patient was deemed in satisfactory                            condition to undergo the procedure.                           After obtaining informed consent, the colonoscope                            was passed under direct vision. Throughout the  procedure, the patient's blood pressure, pulse, and                            oxygen saturations were monitored continuously. The                            Olympus PCF-H190DL [id number]) Colonoscope was                            introduced through the anus and advanced to the the                            cecum, identified by appendiceal orifice and                             ileocecal valve. The quality of the bowel                            preparation was excellent. The colonoscopy was                            performed without difficulty. The patient tolerated                            the procedure well. The bowel preparation used was                            Plenvu via split dose instruction. Scope In: 2:04:32 PM Scope Out: 2:22:50 PM Scope Withdrawal Time: 0 hours 7 minutes 1 second  Total Procedure Duration: 0 hours 18 minutes 18 seconds  Findings:                 The perianal and digital rectal examinations were                            normal.                           The colon (entire examined portion) appeared normal.                           No additional abnormalities were found on                            retroflexion. Complications:            No immediate complications. Estimated blood loss:                            None. Estimated Blood Loss:     Estimated blood loss: none. Recommendation:           - Repeat colonoscopy in 10 years for screening                            purposes.                           -  Patient has a contact number available for                            emergencies. The signs and symptoms of potential                            delayed complications were discussed with the                            patient. Return to normal activities tomorrow.                            Written discharge instructions were provided to the                            patient.                           - Resume previous diet.                           - Continue present medications. Iva Boop, MD 12/19/2020 2:29:02 PM This report has been signed electronically.

## 2020-12-19 NOTE — Patient Instructions (Addendum)
Your colonoscopy was normal. No polyps, no cancer!  I appreciate the opportunity to care for you.  Next routine colonoscopy or other screening test in 10 years - 2032.  Iva Boop, MD, FACG   YOU HAD AN ENDOSCOPIC PROCEDURE TODAY AT THE [facility]:   Refer to the procedure report that was given to you for any specific questions about what was found during the examination.  If the procedure report does not answer your questions, please call your gastroenterologist to clarify.  If you requested that your care partner not be given the details of your procedure findings, then the procedure report has been included in a sealed envelope for you to review at your convenience later.  YOU SHOULD EXPECT: Some feelings of bloating in the abdomen. Passage of more gas than usual.  Walking can help get rid of the air that was put into your GI tract during the procedure and reduce the bloating. If you had a lower endoscopy (such as a colonoscopy or flexible sigmoidoscopy) you may notice spotting of blood in your stool or on the toilet paper. If you underwent a bowel prep for your procedure, you may not have a normal bowel movement for a few days.  Please Note:  You might notice some irritation and congestion in your nose or some drainage.  This is from the oxygen used during your procedure.  There is no need for concern and it should clear up in a day or so.  SYMPTOMS TO REPORT IMMEDIATELY:   Following lower endoscopy (colonoscopy or flexible sigmoidoscopy):  Excessive amounts of blood in the stool  Significant tenderness or worsening of abdominal pains  Swelling of the abdomen that is new, acute  Fever of 100F or higher  For urgent or emergent issues, a gastroenterologist can be reached at any hour by calling [phone]. Do not use MyChart messaging for urgent concerns.    DIET:  We do recommend a small meal at first, but then you may proceed to your regular diet.  Drink plenty  of fluids but you should avoid alcoholic beverages for 24 hours.  ACTIVITY:  You should plan to take it easy for the rest of today and you should NOT DRIVE or use heavy machinery until tomorrow (because of the sedation medicines used during the test).    FOLLOW UP: Our staff will call the number listed on your records 48-72 hours following your procedure to check on you and address any questions or concerns that you may have regarding the information given to you following your procedure. If we do not reach you, we will leave a message.  We will attempt to reach you two times.  During this call, we will ask if you have developed any symptoms of COVID 19. If you develop any symptoms (ie: fever, flu-like symptoms, shortness of breath, cough etc.) before then, please call [phone].  If you test positive for Covid 19 in the 2 weeks post procedure, please call and report this information to [place].    If any biopsies were taken you will be contacted by phone or by letter within the next 1-3 weeks.  Please call us at [phone] if you have not heard about the biopsies in 3 weeks.    SIGNATURES/CONFIDENTIALITY: You and/or your care partner have signed paperwork which will be entered into your electronic medical record.  These signatures attest to the fact that that the information above on your After Visit Summary has been reviewed  and is understood.  Full responsibility of the confidentiality of this discharge information lies with you and/or your care-partner. 

## 2020-12-19 NOTE — Telephone Encounter (Signed)
Please place future orders for lab appt.   Note: [employer]

## 2020-12-19 NOTE — Telephone Encounter (Signed)
Order placed for labs to be drawn at clinic and run through [employer]

## 2020-12-19 NOTE — Progress Notes (Signed)
Report to PACU, RN, vss, BBS= Clear.  

## 2020-12-19 NOTE — Progress Notes (Signed)
VS by CW  Pt's states no medical or surgical changes since previsit or office visit.  

## 2020-12-20 NOTE — Telephone Encounter (Signed)
Orders corrected. Must be future & clinic collect for lab appt here.

## 2020-12-20 NOTE — Addendum Note (Signed)
Addended by: Warden Fillers on: 12/20/2020 12:52 PM   Modules accepted: Orders

## 2020-12-21 ENCOUNTER — Other Ambulatory Visit (INDEPENDENT_AMBULATORY_CARE_PROVIDER_SITE_OTHER): Payer: Managed Care, Other (non HMO)

## 2020-12-21 ENCOUNTER — Other Ambulatory Visit: Payer: Self-pay

## 2020-12-21 ENCOUNTER — Telehealth: Payer: Self-pay

## 2020-12-21 DIAGNOSIS — R739 Hyperglycemia, unspecified: Secondary | ICD-10-CM

## 2020-12-21 DIAGNOSIS — E7800 Pure hypercholesterolemia, unspecified: Secondary | ICD-10-CM

## 2020-12-21 DIAGNOSIS — D649 Anemia, unspecified: Secondary | ICD-10-CM

## 2020-12-21 DIAGNOSIS — I10 Essential (primary) hypertension: Secondary | ICD-10-CM

## 2020-12-21 NOTE — Telephone Encounter (Signed)
  Follow up Call-  Call back number 12/19/2020  Post procedure Call Back phone  # [phone]  Permission to leave phone message Yes  Some recent data might be hidden     Patient questions:  Do you have a fever, pain , or abdominal swelling? No. Pain Score  0 *  Have you tolerated food without any problems? Yes.    Have you been able to return to your normal activities? Yes.    Do you have any questions about your discharge instructions: Diet   No. Medications  No. Follow up visit  No.  Do you have questions or concerns about your Care? No.  Actions: * If pain score is 4 or above: No action needed, pain <4.   1. Have you developed a fever since your procedure? no  2.   Have you had an respiratory symptoms (SOB or cough) since your procedure? no  3.   Have you tested positive for COVID 19 since your procedure no  4.   Have you had any family members/close contacts diagnosed with the COVID 19 since your procedure?  no   If yes to any of these questions please route to Laverna Peace, RN and Karlton Lemon, RN

## 2020-12-22 LAB — BASIC METABOLIC PANEL
BUN/Creatinine Ratio: 7 — ABNORMAL LOW (ref 9–23)
BUN: 7 mg/dL (ref 6–24)
CO2: 22 mmol/L (ref 20–29)
Calcium: 9.6 mg/dL (ref 8.7–10.2)
Chloride: 101 mmol/L (ref 96–106)
Creatinine, Ser: 1 mg/dL (ref 0.57–1.00)
Glucose: 89 mg/dL (ref 65–99)
Potassium: 3.8 mmol/L (ref 3.5–5.2)
Sodium: 140 mmol/L (ref 134–144)
eGFR: 70 mL/min/{1.73_m2} (ref >59)

## 2020-12-22 LAB — LIPID PANEL
Chol/HDL Ratio: 2.5 ratio (ref 0.0–4.4)
Cholesterol, Total: 155 mg/dL (ref 100–199)
HDL: 61 mg/dL (ref >39)
LDL Chol Calc (NIH): 68 mg/dL (ref 0–99)
Triglycerides: 154 mg/dL — ABNORMAL HIGH (ref 0–149)
VLDL Cholesterol Cal: 26 mg/dL (ref 5–40)

## 2020-12-22 LAB — CBC WITH DIFFERENTIAL/PLATELET
Basophils Absolute: 0 10*3/uL (ref 0.0–0.2)
Basos: 1 %
EOS (ABSOLUTE): 0.1 10*3/uL (ref 0.0–0.4)
Eos: 1 %
Hematocrit: 38.2 % (ref 34.0–46.6)
Hemoglobin: 12.1 g/dL (ref 11.1–15.9)
Immature Grans (Abs): 0 10*3/uL (ref 0.0–0.1)
Immature Granulocytes: 0 %
Lymphocytes Absolute: 1.4 10*3/uL (ref 0.7–3.1)
Lymphs: 25 %
MCH: 24.2 pg — ABNORMAL LOW (ref 26.6–33.0)
MCHC: 31.7 g/dL (ref 31.5–35.7)
MCV: 76 fL — ABNORMAL LOW (ref 79–97)
Monocytes Absolute: 0.5 10*3/uL (ref 0.1–0.9)
Monocytes: 8 %
Neutrophils Absolute: 3.7 10*3/uL (ref 1.4–7.0)
Neutrophils: 65 %
Platelets: 265 10*3/uL (ref 150–450)
RBC: 5 x10E6/uL (ref 3.77–5.28)
RDW: 13.1 % (ref 11.7–15.4)
WBC: 5.6 10*3/uL (ref 3.4–10.8)

## 2020-12-22 LAB — FERRITIN: Ferritin: 24 ng/mL (ref 15–150)

## 2020-12-22 LAB — HEPATIC FUNCTION PANEL
ALT: 15 IU/L (ref 0–32)
AST: 18 IU/L (ref 0–40)
Albumin: 4.5 g/dL (ref 3.8–4.8)
Alkaline Phosphatase: 39 IU/L — ABNORMAL LOW (ref 44–121)
Bilirubin Total: 0.2 mg/dL (ref 0.0–1.2)
Bilirubin, Direct: <0.1 mg/dL (ref 0.00–0.40)
Total Protein: 7 g/dL (ref 6.0–8.5)

## 2020-12-22 LAB — TSH: TSH: 0.015 u[IU]/mL — ABNORMAL LOW (ref 0.450–4.500)

## 2020-12-22 LAB — HEMOGLOBIN A1C
Est. average glucose Bld gHb Est-mCnc: 120 mg/dL
Hgb A1c MFr Bld: 5.8 % — ABNORMAL HIGH (ref 4.8–5.6)

## 2020-12-25 ENCOUNTER — Other Ambulatory Visit: Payer: Self-pay

## 2020-12-25 MED ORDER — LEVOTHYROXINE SODIUM 100 MCG PO TABS: 100.0000 ug | ORAL_TABLET | Freq: Every day | ORAL | 1 refills | Status: DC

## 2021-01-05 ENCOUNTER — Other Ambulatory Visit: Payer: Self-pay | Admitting: Internal Medicine

## 2021-02-06 ENCOUNTER — Ambulatory Visit: Payer: Managed Care, Other (non HMO) | Admitting: Pharmacist

## 2021-02-06 VITALS — Wt 175.0 lb

## 2021-02-06 DIAGNOSIS — F320 Major depressive disorder, single episode, mild: Secondary | ICD-10-CM

## 2021-02-06 DIAGNOSIS — F32A Depression, unspecified: Secondary | ICD-10-CM

## 2021-02-06 DIAGNOSIS — E039 Hypothyroidism, unspecified: Secondary | ICD-10-CM

## 2021-02-06 DIAGNOSIS — D649 Anemia, unspecified: Secondary | ICD-10-CM

## 2021-02-06 DIAGNOSIS — Z6827 Body mass index (BMI) 27.0-27.9, adult: Secondary | ICD-10-CM

## 2021-02-06 DIAGNOSIS — E7800 Pure hypercholesterolemia, unspecified: Secondary | ICD-10-CM

## 2021-02-06 NOTE — Patient Instructions (Signed)
Visit Information  Goals Addressed              This Visit's Progress     Patient Stated   .  COMPLETED: PharmD - Medication Monitoring (pt-stated)        Patient Goals/Self-Care Activities . Over the next 90 days, patient will:  - take medications as prescribed target a minimum of 150 minutes of moderate intensity exercise weekly engage in dietary modifications by continuing to monitor portion sizes       The patient verbalized understanding of instructions, educational materials, and care plan provided today and declined offer to receive copy of patient instructions, educational materials, and care plan.    Plan: Patient declines need for CCM follow up. She has my contact information for future questions or concerns.   Catie Feliz Beam, PharmD, [place], CPP Clinical Pharmacist [employer] at [employer] [phone]

## 2021-02-06 NOTE — Chronic Care Management (AMB) (Signed)
Care Management   Pharmacy Note  02/06/2021 Name: [name] Nielsen MRN: [id number] DOB: [birth year]  Subjective: [name] Nielsen is a 45 y.o. year old female who is a primary care patient of Einar Pheasant, MD. The Care Management team was consulted for assistance with care management and care coordination needs.    Engaged with patient by telephone for follow up visit in response to provider referral for pharmacy case management and/or care coordination services.   The patient was given information about Care Management services today including:  1. Care Management services includes personalized support from designated clinical staff supervised by the patient's primary care provider, including individualized plan of care and coordination with other care providers. 2. 24/7 contact phone numbers for assistance for urgent and routine care needs. 3. The patient may stop case management services at any time by phone call to the office staff.  Patient agreed to services and consent obtained.  Assessment:  Review of patient status, including review of consultants reports, laboratory and other test data, was performed as part of comprehensive evaluation and provision of chronic care management services.   SDOH (Social Determinants of Health) assessments and interventions performed:    Objective:  Lab Results  Component Value Date   CREATININE 1.00 12/21/2020   CREATININE 1.01 (H) 11/06/2020   CREATININE 0.99 07/05/2020    Lab Results  Component Value Date   HGBA1C 5.8 (H) 12/21/2020       Component Value Date/Time   CHOL 155 12/21/2020 1143   TRIG 154 (H) 12/21/2020 1143   HDL 61 12/21/2020 1143   CHOLHDL 2.5 12/21/2020 1143   CHOLHDL 4 09/20/2019 0816   VLDL 16.4 09/20/2019 0816   LDLCALC 68 12/21/2020 1143   LDLDIRECT 178.3 11/11/2013 1059   Lab Results  Component Value Date   TSH 0.015 (L) 12/21/2020   Last vitamin D Lab Results  Component Value Date   VD25OH 57.2  03/10/2020    Clinical ASCVD: No  The 10-year ASCVD risk score Mikey Bussing DC Jr., et al., 2013) is: 0.4%   Values used to calculate the score:     Age: 60 years     Sex: Female     Is Non-Hispanic African American: No     Diabetic: No     Tobacco smoker: No     Systolic Blood Pressure: 95 mmHg     Is BP treated: Yes     HDL Cholesterol: 61 mg/dL     Total Cholesterol: 155 mg/dL    Other: (CHADS2VASc if Afib, PHQ9 if depression, MMRC or CAT for COPD, ACT, DEXA)  BP Readings from Last 3 Encounters:  12/19/20 95/71  11/06/20 112/70  07/05/20 118/72    Care Plan  No Known Allergies  Medications Reviewed Today    Reviewed by De Hollingshead, RPH-CPP (Pharmacist) on 02/06/21 at 42  Med List Status: <None>  Medication Order Taking? Sig Documenting Provider Last Dose Status Informant  acetaminophen (TYLENOL) 500 MG tablet [id number]  Take 1,000 mg by mouth every 6 (six) hours as needed for mild pain or moderate pain. [provider]  Active Self  ALPRAZolam (XANAX) 0.25 MG tablet [id number]  Take 1 tablet (0.25 mg total) by mouth daily as needed for anxiety. Einar Pheasant, MD  Active   buPROPion (WELLBUTRIN XL) 150 MG 24 hr tablet [id number]  Take 1 tablet by mouth once daily Einar Pheasant, MD  Active   cholecalciferol (VITAMIN D3) 25 MCG (1000 UNIT) tablet [id number]  Take 2,000 Units by  mouth daily. [provider]  Active   Cyanocobalamin (B-12 PO) [id number]  Take by mouth. [provider]  Active   hydrochlorothiazide (MICROZIDE) 12.5 MG capsule [id number]  Take 1 capsule by mouth once daily Einar Pheasant, MD  Active   levothyroxine (EUTHYROX) 100 MCG tablet [id number]  Take 1 tablet (100 mcg total) by mouth daily before breakfast. Einar Pheasant, MD  Active   lisinopril (ZESTRIL) 10 MG tablet [id number]  Take 1 tablet by mouth once daily Einar Pheasant, MD  Active   rosuvastatin (CRESTOR) 10 MG tablet [id number]  Take 1 tablet (10 mg total) by mouth  daily. Einar Pheasant, MD  Active   Semaglutide-Weight Management 2.4 MG/0.75ML Darden Palmer [id number] Yes Inject 2.4 mg into the skin once a week. Einar Pheasant, MD Taking Active   Med List Note [name] Nielsen, [place] 08/25/18 1118): Order Labs for [place]          Patient Active Problem List   Diagnosis Date Noted  . Fatigue 11/06/2020  . Cervical cancer screening 07/05/2020  . Colon cancer screening 03/06/2020  . Eye swelling 03/06/2020  . Hypokalemia 09/26/2019  . Hyperglycemia 05/29/2019  . Pelvic pressure in female 05/02/2019  . Heavy menses 10/29/2018  . Right elbow pain 09/24/2016  . Arm swelling 12/25/2015  . Mild depression ([place]) 07/10/2015  . Insomnia 07/10/2015  . Poor concentration 01/22/2015  . Health care maintenance 11/20/2014  . UTI (urinary tract infection) 08/27/2014  . 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 Abnormal liver function test 02/06/2014  . BMI 31.0-31.9,adult 10/06/2013  . Anemia 07/05/2013  . Headache(784.0) 05/05/2013  . Essential hypertension, benign 05/05/2013  . Hypercholesterolemia 05/05/2013  . Hypothyroidism 05/05/2013    Conditions to be addressed/monitored: HTN, HLD and Obesity  Care Plan : Medication Management  Updates made by De Hollingshead, RPH-CPP since 02/06/2021 12:00 AM  Completed 02/06/2021  Problem: Obesity, HTN, HLD, Hypothyroidism Resolved 02/06/2021    Long-Range Goal: Disease Progression Prevention Completed 02/06/2021  This Visit's Progress: On track  Recent Progress: On track  Priority: High  Note:   Current Barriers:  . Unable to achieve control of weight   Pharmacist Clinical Goal(s):  Marland Kitchen Over the next 90 days, patient will achieve 5-10% reduction in weight from baseline through collaboration with PharmD and provider.   Interventions: . 1:1 collaboration with Einar Pheasant, MD regarding development and update of comprehensive plan of care as evidenced by provider attestation and  co-signature . Inter-disciplinary care team collaboration (see longitudinal plan of care) . Comprehensive medication review performed; medication list updated in electronic medical record  Obesity, hx bariatric surgery: . Improved through combination of lifestyle modification and therapy; current treatment: Wegovy 2.4 mg weekly . Baseline weight pre-therapy: 207 lbs; most recent home weight: 175 lbs; 32 lbs loss, 15% reduction in weight . Medications previously tried: successful during therapy, but gained weight back. Retrial of phentermine inappropriate given HTN, other phentermine-containing regimens also inappropriate. . Current meal patterns: breakfast: coffee, sometimes protein shake/snack;  lunch: salad, chicken strips + vegetables; avoiding carb sides; dinner: leftovers; drinks: water, coffee, unsweet tea . Current exercise: 6 days per week, yoga, weights three days per week, HIIT training; added in running 3 times weekly over the past 2 months . Praised for successful achievement of goal weight loss. Denies any issues with GI upset. Recommend to continue current therapy at this time. Follow up with PCP in ~ 4 weeks as previously scheduled.   Hypertension: . Controlled; current treatment: lisinopril  10 mg daily, HCTZ 12.5 mg daily . Previously recommended to continue current regimen at this time  Hyperlipidemia: . Controlled; current treatment: rosuvastatin 10 mg daily   . Recommended to continue current regimen at this time  Depression/Anxiety . Patient denies concerns at this time. Current treatment: bupropion XL 150 mg daily . Follows w/ therapist in [place] . Previously recommended to continue current regimen at this time  Hypothyroidism: . Uncontrolled per last lab work; reduced to levothyroxine 100 mcg daily  . Follow up TSH ordered and scheduled by PCP. Patient aware of lab date. Recommend to continue current regimen at this time and adjust levothyroxine dose as needed  based on TSH results.   Supplements: Marland Kitchen Vitamin D, Vitamin B12; appropriate to continue current regimen at this time  Patient Goals/Self-Care Activities . Over the next 90 days, patient will:  - take medications as prescribed target a minimum of 150 minutes of moderate intensity exercise weekly engage in dietary modifications by continuing to monitor portion sizes  Follow Up Plan: Goals of therapy met. Patient declines need for continued CCM support      Medication Assistance:  None required.  Patient affirms current coverage meets needs.  Follow Up:  Patient requests no follow-up at this time.  Plan: Patient declines need for CCM follow up. She has my contact information for future questions or concerns.   Catie Darnelle Maffucci, PharmD, [place], [place] Clinical Pharmacist [employer] at [place]

## 2021-02-08 ENCOUNTER — Telehealth: Payer: Self-pay | Admitting: *Deleted

## 2021-02-08 DIAGNOSIS — E039 Hypothyroidism, unspecified: Secondary | ICD-10-CM

## 2021-02-08 NOTE — Telephone Encounter (Signed)
Please place future orders for lab appt.   (Pt usually needs labs ordered for [facility])

## 2021-02-08 NOTE — Telephone Encounter (Signed)
Order placed for f/u tsh.  

## 2021-02-19 ENCOUNTER — Other Ambulatory Visit (INDEPENDENT_AMBULATORY_CARE_PROVIDER_SITE_OTHER): Payer: Managed Care, Other (non HMO)

## 2021-02-19 ENCOUNTER — Other Ambulatory Visit: Payer: Self-pay

## 2021-02-19 DIAGNOSIS — E039 Hypothyroidism, unspecified: Secondary | ICD-10-CM

## 2021-02-19 LAB — TSH: TSH: 0.34 u[IU]/mL — ABNORMAL LOW (ref 0.35–4.50)

## 2021-02-20 ENCOUNTER — Other Ambulatory Visit: Payer: Self-pay

## 2021-02-20 MED ORDER — LEVOTHYROXINE SODIUM 88 MCG PO TABS: 88.0000 ug | ORAL_TABLET | Freq: Every day | ORAL | 1 refills | Status: DC

## 2021-02-21 ENCOUNTER — Other Ambulatory Visit: Payer: Self-pay | Admitting: Internal Medicine

## 2021-03-06 ENCOUNTER — Encounter: Payer: Self-pay | Admitting: Internal Medicine

## 2021-03-06 ENCOUNTER — Ambulatory Visit (INDEPENDENT_AMBULATORY_CARE_PROVIDER_SITE_OTHER): Payer: Managed Care, Other (non HMO) | Admitting: Internal Medicine

## 2021-03-06 ENCOUNTER — Other Ambulatory Visit: Payer: Self-pay

## 2021-03-06 VITALS — BP 112/70 | HR 85 | Temp 97.9°F | Resp 16 | Ht 67.0 in | Wt 177.6 lb

## 2021-03-06 DIAGNOSIS — R739 Hyperglycemia, unspecified: Secondary | ICD-10-CM

## 2021-03-06 DIAGNOSIS — N63 Unspecified lump in unspecified breast: Secondary | ICD-10-CM | POA: Diagnosis not present

## 2021-03-06 DIAGNOSIS — Z0000 Encounter for general adult medical examination without abnormal findings: Secondary | ICD-10-CM

## 2021-03-06 DIAGNOSIS — E7800 Pure hypercholesterolemia, unspecified: Secondary | ICD-10-CM

## 2021-03-06 DIAGNOSIS — F320 Major depressive disorder, single episode, mild: Secondary | ICD-10-CM

## 2021-03-06 DIAGNOSIS — I10 Essential (primary) hypertension: Secondary | ICD-10-CM | POA: Diagnosis not present

## 2021-03-06 DIAGNOSIS — E039 Hypothyroidism, unspecified: Secondary | ICD-10-CM | POA: Diagnosis not present

## 2021-03-06 DIAGNOSIS — F32A Depression, unspecified: Secondary | ICD-10-CM

## 2021-03-06 DIAGNOSIS — D649 Anemia, unspecified: Secondary | ICD-10-CM

## 2021-03-06 NOTE — Progress Notes (Signed)
Patient ID: [name] Nielsen, female   DOB: Nov 20, 1975, 45 y.o.   MRN: [id number]   Subjective:    Patient ID: [name] Nielsen, female    DOB: [birth year], 45 y.o.   MRN: [id number]  HPI This visit occurred during the SARS-CoV-2 public health emergency.  Safety protocols were in place, including screening questions prior to the visit, additional usage of staff PPE, and extensive cleaning of exam room while observing appropriate contact time as indicated for disinfecting solutions.  Patient here for her physical exam.  She started wegoby.  Has lost weight.  Feels better.  Trying to stay active.  No chest pain.  Bowels moving.  No cough, congestion or sob reported.  Eating.  No nausea or vomiting.  Handling stress.  No dizziness or light headedness.    Past Medical History:  Diagnosis Date  . Anemia    hx of   . Anxiety   . COVID-19 virus infection 11/2019  . Depression   . Frequent headaches    hx of  . Heart murmur   . Hx: UTI (urinary tract infection)    child, s/p urinary procecure  . Hypercholesterolemia   . Hypertension   . Hypothyroidism    Past Surgical History:  Procedure Laterality Date  . CHOLECYSTECTOMY  2012  . COLONOSCOPY     > 10 yrs ago- normal   . LAPAROSCOPIC GASTRIC SLEEVE RESECTION N/A 07/05/2014   Procedure: LAPAROSCOPIC GASTRIC SLEEVE RESECTION with upper endoscopy;  Surgeon: Kaylyn Lim, MD;  Location: [facility];  Service: General;  Laterality: N/A;  . TUBAL LIGATION  1999  . WISDOM TOOTH EXTRACTION     age 75    Family History  Problem Relation Age of Onset  . Cancer Mother        brain  . Diabetes Father   . Hypertension Father   . Kidney disease Father   . Stroke Father   . Cancer Father        prostate  . Prostate cancer Father   . Breast cancer Neg Hx   . Colon cancer Neg Hx   . Colon polyps Neg Hx   . Esophageal cancer Neg Hx   . Rectal cancer Neg Hx   . Stomach cancer Neg Hx    Social History   Socioeconomic History  . Marital status:  Single    Spouse name: Not on file  . Number of children: Not on file  . Years of education: Not on file  . Highest education level: Not on file  Occupational History  . Not on file  Tobacco Use  . Smoking status: Former Research scientist (life sciences)  . Smokeless tobacco: Never Used  Substance and Sexual Activity  . Alcohol use: No    Alcohol/week: 0.0 standard drinks  . Drug use: No  . Sexual activity: Not on file  Other Topics Concern  . Not on file  Social History Narrative  . Not on file   Social Determinants of Health   Financial Resource Strain: Low Risk   . Difficulty of Paying Living Expenses: Not hard at all  Food Insecurity: Not on file  Transportation Needs: Not on file  Physical Activity: Sufficiently Active  . Days of Exercise per Week: 6 days  . Minutes of Exercise per Session: 50 min  Stress: No Stress Concern Present  . Feeling of Stress : Only a little  Social Connections: Not on file    Outpatient Encounter Medications as of 03/06/2021  Medication Sig  . acetaminophen (  TYLENOL) 500 MG tablet Take 1,000 mg by mouth every 6 (six) hours as needed for mild pain or moderate pain.  Marland Kitchen ALPRAZolam (XANAX) 0.25 MG tablet Take 1 tablet (0.25 mg total) by mouth daily as needed for anxiety.  Marland Kitchen buPROPion (WELLBUTRIN XL) 150 MG 24 hr tablet Take 1 tablet by mouth once daily  . cholecalciferol (VITAMIN D3) 25 MCG (1000 UNIT) tablet Take 2,000 Units by mouth daily.  . Cyanocobalamin (B-12 PO) Take by mouth.  . hydrochlorothiazide (MICROZIDE) 12.5 MG capsule Take 1 capsule by mouth once daily  . levothyroxine (EUTHYROX) 88 MCG tablet Take 1 tablet (88 mcg total) by mouth daily before breakfast.  . lisinopril (ZESTRIL) 10 MG tablet Take 1 tablet by mouth once daily  . rosuvastatin (CRESTOR) 10 MG tablet Take 1 tablet (10 mg total) by mouth daily.  . Semaglutide-Weight Management 2.4 MG/0.75ML SOAJ Inject 2.4 mg into the skin once a week.   No facility-administered encounter medications on file  as of 03/06/2021.    Review of Systems  Constitutional: Negative for appetite change and unexpected weight change.  HENT: Negative for congestion, sinus pressure and sore throat.   Eyes: Negative for pain and visual disturbance.  Respiratory: Negative for cough, chest tightness and shortness of breath.   Cardiovascular: Negative for chest pain, palpitations and leg swelling.  Gastrointestinal: Negative for abdominal pain, diarrhea, nausea and vomiting.  Genitourinary: Negative for difficulty urinating and dysuria.  Musculoskeletal: Negative for joint swelling and myalgias.  Skin: Negative for color change and rash.  Neurological: Negative for dizziness, light-headedness and headaches.  Hematological: Negative for adenopathy. Does not bruise/bleed easily.  Psychiatric/Behavioral: Negative for agitation and dysphoric mood.       Objective:    Physical Exam Vitals reviewed.  Constitutional:      General: She is not in acute distress.    Appearance: Normal appearance.  HENT:     Head: Normocephalic and atraumatic.     Right Ear: External ear normal.     Left Ear: External ear normal.  Eyes:     General: No scleral icterus.       Right eye: No discharge.        Left eye: No discharge.     Conjunctiva/sclera: Conjunctivae normal.  Neck:     Thyroid: No thyromegaly.  Cardiovascular:     Rate and Rhythm: Normal rate and regular rhythm.  Pulmonary:     Effort: No respiratory distress.     Breath sounds: Normal breath sounds. No wheezing.     Comments: Breasts:  Right breast nodule 3:00.  No nipple discharge or nipple retraction present.  No left breast palpable nodule.  No axillary adenopathy.   Abdominal:     General: Bowel sounds are normal.     Palpations: Abdomen is soft.     Tenderness: There is no abdominal tenderness.  Musculoskeletal:        General: No swelling or tenderness.     Cervical back: Neck supple. No tenderness.  Lymphadenopathy:     Cervical: No cervical  adenopathy.  Skin:    Findings: No erythema or rash.  Neurological:     Mental Status: She is alert.  Psychiatric:        Mood and Affect: Mood normal.        Behavior: Behavior normal.     BP 112/70   Pulse 85   Temp 97.9 F (36.6 C)   Resp 16   Ht 5\' 7"  (1.702 m)  Wt 177 lb 9.6 oz (80.6 kg)   SpO2 99%   BMI 27.82 kg/m  Wt Readings from Last 3 Encounters:  03/06/21 177 lb 9.6 oz (80.6 kg)  02/06/21 175 lb (79.4 kg)  12/19/20 184 lb 12.8 oz (83.8 kg)     Lab Results  Component Value Date   WBC 5.6 12/21/2020   HGB 12.1 12/21/2020   HCT 38.2 12/21/2020   PLT 265 12/21/2020   GLUCOSE 89 12/21/2020   CHOL 155 12/21/2020   TRIG 154 (H) 12/21/2020   HDL 61 12/21/2020   LDLDIRECT 178.3 11/11/2013   LDLCALC 68 12/21/2020   ALT 15 12/21/2020   AST 18 12/21/2020   NA 140 12/21/2020   K 3.8 12/21/2020   CL 101 12/21/2020   CREATININE 1.00 12/21/2020   BUN 7 12/21/2020   CO2 22 12/21/2020   TSH 0.34 (L) 02/19/2021   HGBA1C 5.8 (H) 12/21/2020       Assessment & Plan:   Problem List Items Addressed This Visit    Anemia    Follow cbc.  History of gastric surgery.       Breast nodule    Breast nodule 3:00 right breast.  Schedule diagnostic mammogram right breast.        Relevant Orders   MM DIAG BREAST TOMO UNI RIGHT   Essential hypertension, benign    Continues on lisinopril and hctz.  Blood pressures as outlined.  No changes.  Follow pressures.  Follow metabolic panel.       Health care maintenance    Physical today 03/06/21.  Mammogram 05/10/20 - Birads I.  PAP 11/30/18 - negative with negative HPV.  Colonoscopy 12/2020 - normal. No polyps.  Recommended f/u in 10 years.       Hypercholesterolemia    On crestor. Low cholesterol diet and exercise.  Follow lipid panel and liver function tests.        Hyperglycemia    Low carb diet and exercise.  Follow met b and a1c.  Has lost weight.  Follow.       Hypothyroidism    On thyroid replacement.  Follow tsh.        Mild depression ([place])    Continues on wellbutrin.  Follow.  Stable.        Other Visit Diagnoses    Routine general medical examination at a health care facility    -  Primary       Einar Pheasant, MD

## 2021-03-11 ENCOUNTER — Encounter: Payer: Self-pay | Admitting: Internal Medicine

## 2021-03-11 DIAGNOSIS — N63 Unspecified lump in unspecified breast: Secondary | ICD-10-CM

## 2021-03-12 ENCOUNTER — Encounter: Payer: Self-pay | Admitting: Internal Medicine

## 2021-03-12 NOTE — Telephone Encounter (Signed)
Order placed for right breast ultrasound.  

## 2021-03-12 NOTE — Assessment & Plan Note (Signed)
Continues on wellbutrin.  Follow.  Stable.  

## 2021-03-12 NOTE — Assessment & Plan Note (Signed)
On crestor.  Low cholesterol diet and exercise.  Follow lipid panel and liver function tests.   

## 2021-03-12 NOTE — Assessment & Plan Note (Signed)
Continues on lisinopril and hctz.  Blood pressures as outlined.  No changes.  Follow pressures.  Follow metabolic panel.

## 2021-03-12 NOTE — Assessment & Plan Note (Signed)
On thyroid replacement.  Follow tsh.  

## 2021-03-12 NOTE — Assessment & Plan Note (Signed)
Follow cbc.  History of gastric surgery.

## 2021-03-12 NOTE — Assessment & Plan Note (Signed)
Physical today 03/06/21.  Mammogram 05/10/20 - Birads I.  PAP 11/30/18 - negative with negative HPV.  Colonoscopy 12/2020 - normal. No polyps.  Recommended f/u in 10 years.

## 2021-03-12 NOTE — Assessment & Plan Note (Signed)
Low carb diet and exercise.  Follow met b and a1c.  Has lost weight.  Follow.

## 2021-03-12 NOTE — Assessment & Plan Note (Signed)
Breast nodule 3:00 right breast.  Schedule diagnostic mammogram right breast.

## 2021-03-15 ENCOUNTER — Other Ambulatory Visit: Payer: Self-pay

## 2021-03-15 ENCOUNTER — Ambulatory Visit
Admission: RE | Admit: 2021-03-15 | Discharge: 2021-03-15 | Disposition: A | Discharge status: Home / Self Care | Payer: Managed Care, Other (non HMO) | Source: Ambulatory Visit | Attending: Internal Medicine | Admitting: Internal Medicine

## 2021-03-15 DIAGNOSIS — N63 Unspecified lump in unspecified breast: Secondary | ICD-10-CM

## 2021-03-22 ENCOUNTER — Encounter: Payer: Self-pay | Admitting: Internal Medicine

## 2021-03-23 ENCOUNTER — Other Ambulatory Visit: Payer: Self-pay

## 2021-03-23 DIAGNOSIS — Z6829 Body mass index (BMI) 29.0-29.9, adult: Secondary | ICD-10-CM

## 2021-03-23 MED ORDER — SEMAGLUTIDE-WEIGHT MANAGEMENT 2.4 MG/0.75ML ~~LOC~~ SOAJ: 2.4000 mg | SUBCUTANEOUS | 1 refills | Status: DC

## 2021-03-23 NOTE — Telephone Encounter (Signed)
Medication refilled. Patient is aware °

## 2021-04-13 ENCOUNTER — Telehealth: Payer: Self-pay | Admitting: *Deleted

## 2021-04-13 ENCOUNTER — Other Ambulatory Visit: Payer: Self-pay | Admitting: Internal Medicine

## 2021-04-13 DIAGNOSIS — E7800 Pure hypercholesterolemia, unspecified: Secondary | ICD-10-CM

## 2021-04-13 DIAGNOSIS — D649 Anemia, unspecified: Secondary | ICD-10-CM

## 2021-04-13 DIAGNOSIS — E039 Hypothyroidism, unspecified: Secondary | ICD-10-CM

## 2021-04-13 DIAGNOSIS — I10 Essential (primary) hypertension: Secondary | ICD-10-CM

## 2021-04-13 DIAGNOSIS — R739 Hyperglycemia, unspecified: Secondary | ICD-10-CM

## 2021-04-13 NOTE — Telephone Encounter (Signed)
Order placed for f/u labs.  

## 2021-04-13 NOTE — Telephone Encounter (Signed)
Please place future orders for lab appt.  

## 2021-04-17 ENCOUNTER — Other Ambulatory Visit (INDEPENDENT_AMBULATORY_CARE_PROVIDER_SITE_OTHER): Payer: Managed Care, Other (non HMO)

## 2021-04-17 ENCOUNTER — Other Ambulatory Visit: Payer: Self-pay

## 2021-04-17 DIAGNOSIS — R739 Hyperglycemia, unspecified: Secondary | ICD-10-CM | POA: Diagnosis not present

## 2021-04-17 DIAGNOSIS — I10 Essential (primary) hypertension: Secondary | ICD-10-CM | POA: Diagnosis not present

## 2021-04-17 DIAGNOSIS — E7800 Pure hypercholesterolemia, unspecified: Secondary | ICD-10-CM | POA: Diagnosis not present

## 2021-04-17 DIAGNOSIS — E039 Hypothyroidism, unspecified: Secondary | ICD-10-CM

## 2021-04-17 DIAGNOSIS — D649 Anemia, unspecified: Secondary | ICD-10-CM | POA: Diagnosis not present

## 2021-04-18 LAB — CBC WITH DIFFERENTIAL/PLATELET
Basophils Absolute: 0.1 10*3/uL (ref 0.0–0.1)
Basophils Relative: 0.9 % (ref 0.0–3.0)
Eosinophils Absolute: 0.1 10*3/uL (ref 0.0–0.7)
Eosinophils Relative: 1 % (ref 0.0–5.0)
HCT: 36.4 % (ref 36.0–46.0)
Hemoglobin: 12.2 g/dL (ref 12.0–15.0)
Lymphocytes Relative: 28.6 % (ref 12.0–46.0)
Lymphs Abs: 1.7 10*3/uL (ref 0.7–4.0)
MCHC: 33.5 g/dL (ref 30.0–36.0)
MCV: 73.1 fl — ABNORMAL LOW (ref 78.0–100.0)
Monocytes Absolute: 0.4 10*3/uL (ref 0.1–1.0)
Monocytes Relative: 6.9 % (ref 3.0–12.0)
Neutro Abs: 3.8 10*3/uL (ref 1.4–7.7)
Neutrophils Relative %: 62.6 % (ref 43.0–77.0)
Platelets: 285 10*3/uL (ref 150.0–400.0)
RBC: 4.99 Mil/uL (ref 3.87–5.11)
RDW: 14.2 % (ref 11.5–15.5)
WBC: 6 10*3/uL (ref 4.0–10.5)

## 2021-04-18 LAB — LIPID PANEL
Cholesterol: 179 mg/dL (ref 0–200)
HDL: 69.3 mg/dL (ref >39.00)
LDL Cholesterol: 85 mg/dL (ref 0–99)
NonHDL: 109.98
Total CHOL/HDL Ratio: 3
Triglycerides: 126 mg/dL (ref 0.0–149.0)
VLDL: 25.2 mg/dL (ref 0.0–40.0)

## 2021-04-18 LAB — BASIC METABOLIC PANEL
BUN: 7 mg/dL (ref 6–23)
CO2: 27 mEq/L (ref 19–32)
Calcium: 9.5 mg/dL (ref 8.4–10.5)
Chloride: 103 mEq/L (ref 96–112)
Creatinine, Ser: 1.08 mg/dL (ref 0.40–1.20)
GFR: 61.18 mL/min (ref >60.00)
Glucose, Bld: 95 mg/dL (ref 70–99)
Potassium: 3.4 mEq/L — ABNORMAL LOW (ref 3.5–5.1)
Sodium: 138 mEq/L (ref 135–145)

## 2021-04-18 LAB — HEMOGLOBIN A1C: Hgb A1c MFr Bld: 5.7 % (ref 4.6–6.5)

## 2021-04-18 LAB — TSH: TSH: 1.53 u[IU]/mL (ref 0.35–5.50)

## 2021-04-18 LAB — HEPATIC FUNCTION PANEL
ALT: 30 U/L (ref 0–35)
AST: 20 U/L (ref 0–37)
Albumin: 4.7 g/dL (ref 3.5–5.2)
Alkaline Phosphatase: 36 U/L — ABNORMAL LOW (ref 39–117)
Bilirubin, Direct: 0.1 mg/dL (ref 0.0–0.3)
Total Bilirubin: 0.6 mg/dL (ref 0.2–1.2)
Total Protein: 7.4 g/dL (ref 6.0–8.3)

## 2021-04-20 ENCOUNTER — Telehealth: Payer: Self-pay

## 2021-04-20 DIAGNOSIS — E876 Hypokalemia: Secondary | ICD-10-CM

## 2021-04-20 NOTE — Telephone Encounter (Signed)
Labs ordered for potassium for future labs

## 2021-04-25 ENCOUNTER — Other Ambulatory Visit: Payer: Self-pay | Admitting: Internal Medicine

## 2021-04-26 ENCOUNTER — Other Ambulatory Visit: Payer: Self-pay | Admitting: Internal Medicine

## 2021-05-04 ENCOUNTER — Other Ambulatory Visit: Payer: Self-pay

## 2021-05-04 ENCOUNTER — Other Ambulatory Visit (INDEPENDENT_AMBULATORY_CARE_PROVIDER_SITE_OTHER): Payer: Managed Care, Other (non HMO)

## 2021-05-04 DIAGNOSIS — E876 Hypokalemia: Secondary | ICD-10-CM

## 2021-05-04 LAB — POTASSIUM: Potassium: 3.8 mEq/L (ref 3.5–5.1)

## 2021-05-26 ENCOUNTER — Other Ambulatory Visit: Payer: Self-pay | Admitting: Internal Medicine

## 2021-05-30 ENCOUNTER — Other Ambulatory Visit: Payer: Self-pay | Admitting: Internal Medicine

## 2021-05-31 NOTE — Telephone Encounter (Signed)
Received refill request for crestor.  In reviewing, the concern was that it had been discontinued.  Per Catie's last note - "continue crestor".  If there is a question about if she is taking, please call her and confirm if taking.  If so, ok to refill.

## 2021-05-31 NOTE — Telephone Encounter (Signed)
Looks like this medication has been discontinued by [employer] on 08/16/2020.

## 2021-07-06 ENCOUNTER — Other Ambulatory Visit: Payer: Self-pay

## 2021-07-06 ENCOUNTER — Telehealth: Payer: Self-pay | Admitting: Pharmacist

## 2021-07-06 ENCOUNTER — Ambulatory Visit: Payer: Managed Care, Other (non HMO) | Admitting: Internal Medicine

## 2021-07-06 ENCOUNTER — Ambulatory Visit (INDEPENDENT_AMBULATORY_CARE_PROVIDER_SITE_OTHER): Payer: Managed Care, Other (non HMO)

## 2021-07-06 VITALS — BP 106/70 | HR 82 | Temp 97.8°F | Resp 16 | Ht 67.0 in | Wt 170.0 lb

## 2021-07-06 DIAGNOSIS — Z1231 Encounter for screening mammogram for malignant neoplasm of breast: Secondary | ICD-10-CM

## 2021-07-06 DIAGNOSIS — Q799 Congenital malformation of musculoskeletal system, unspecified: Secondary | ICD-10-CM

## 2021-07-06 DIAGNOSIS — I10 Essential (primary) hypertension: Secondary | ICD-10-CM

## 2021-07-06 DIAGNOSIS — D649 Anemia, unspecified: Secondary | ICD-10-CM

## 2021-07-06 DIAGNOSIS — F320 Major depressive disorder, single episode, mild: Secondary | ICD-10-CM | POA: Diagnosis not present

## 2021-07-06 DIAGNOSIS — E039 Hypothyroidism, unspecified: Secondary | ICD-10-CM

## 2021-07-06 DIAGNOSIS — Z6829 Body mass index (BMI) 29.0-29.9, adult: Secondary | ICD-10-CM

## 2021-07-06 DIAGNOSIS — R739 Hyperglycemia, unspecified: Secondary | ICD-10-CM

## 2021-07-06 DIAGNOSIS — F32A Depression, unspecified: Secondary | ICD-10-CM

## 2021-07-06 DIAGNOSIS — E7800 Pure hypercholesterolemia, unspecified: Secondary | ICD-10-CM

## 2021-07-06 MED ORDER — SEMAGLUTIDE-WEIGHT MANAGEMENT 2.4 MG/0.75ML ~~LOC~~ SOAJ: 2.4000 mg | SUBCUTANEOUS | 3 refills | Status: DC

## 2021-07-06 NOTE — Telephone Encounter (Signed)
PA approved through 01/03/2022

## 2021-07-06 NOTE — Addendum Note (Signed)
Addended by: Lourena Simmonds on: 07/06/2021 04:41 PM   Modules accepted: Orders

## 2021-07-06 NOTE — Telephone Encounter (Signed)
PA for Wegovy 2.4 mg submitted via Cover My Meds ( [id number])

## 2021-07-06 NOTE — Progress Notes (Signed)
Patient ID: [name] Nielsen, female   DOB: 06-May-1976, 45 y.o.   MRN: [id number]   Subjective:    Patient ID: [name] Nielsen, female    DOB: 1975-11-27, 45 y.o.   MRN: [id number]  This visit occurred during the SARS-CoV-2 public health emergency.  Safety protocols were in place, including screening questions prior to the visit, additional usage of staff PPE, and extensive cleaning of exam room while observing appropriate contact time as indicated for disinfecting solutions.   Patient here for a scheduled follow up.   Chief Complaint  Patient presents with   Hypothyroidism   .   HPI She continues to lose weight.  Doing well with wegovy.  Stays active.  No chest pain or sob reported.  Would like tsh rechecked given weight loss, etc.  Occasionally will notice some light headedness when stands up.  Given weight loss, discussed adjusting/decreasing her blood pressure medication.  No headache reported.  No vomiting.  Bowels appear to be stable.  Increased stress - family stress.  Has noticed a bony protuberance - right knee. No pain.    Past Medical History:  Diagnosis Date   Anemia    hx of    Anxiety    COVID-19 virus infection 11/2019   Depression    Frequent headaches    hx of   Heart murmur    Hx: UTI (urinary tract infection)    child, s/p urinary procecure   Hypercholesterolemia    Hypertension    Hypothyroidism    Past Surgical History:  Procedure Laterality Date   CHOLECYSTECTOMY  2012   COLONOSCOPY     > 10 yrs ago- normal    LAPAROSCOPIC GASTRIC SLEEVE RESECTION N/A 07/05/2014   Procedure: LAPAROSCOPIC GASTRIC SLEEVE RESECTION with upper endoscopy;  Surgeon: Kaylyn Lim, MD;  Location: [facility];  Service: General;  Laterality: N/A;   TUBAL LIGATION  1999   WISDOM TOOTH EXTRACTION     age 25    Family History  Problem Relation Age of Onset   Cancer Mother        brain   Diabetes Father    Hypertension Father    Kidney disease Father    Stroke Father    Cancer  Father        prostate   Prostate cancer Father    Breast cancer Neg Hx    Colon cancer Neg Hx    Colon polyps Neg Hx    Esophageal cancer Neg Hx    Rectal cancer Neg Hx    Stomach cancer Neg Hx    Social History   Socioeconomic History   Marital status: Single    Spouse name: Not on file   Number of children: Not on file   Years of education: Not on file   Highest education level: Not on file  Occupational History   Not on file  Tobacco Use   Smoking status: Former   Smokeless tobacco: Never  Substance and Sexual Activity   Alcohol use: No    Alcohol/week: 0.0 standard drinks   Drug use: No   Sexual activity: Not on file  Other Topics Concern   Not on file  Social History Narrative   Not on file   Social Determinants of Health   Financial Resource Strain: Not on file  Food Insecurity: Not on file  Transportation Needs: Not on file  Physical Activity: Sufficiently Active   Days of Exercise per Week: 6 days   Minutes of Exercise per Session:  50 min  Stress: No Stress Concern Present   Feeling of Stress : Only a little  Social Connections: Not on file     Review of Systems  Constitutional:  Negative for appetite change and unexpected weight change.       Continued weight loss on wegovy.   HENT:  Negative for congestion and sinus pressure.   Respiratory:  Negative for cough, chest tightness and shortness of breath.   Cardiovascular:  Negative for chest pain and palpitations.  Gastrointestinal:  Negative for abdominal pain, diarrhea and vomiting.  Genitourinary:  Negative for difficulty urinating and dysuria.  Musculoskeletal:  Negative for joint swelling and myalgias.  Skin:  Negative for color change and rash.  Neurological:  Negative for dizziness and headaches.       Occasional light headedness as outlined.   Psychiatric/Behavioral:  Negative for agitation and dysphoric mood.       Objective:     BP 106/70   Pulse 82   Temp 97.8 F (36.6 C)   Resp  16   Ht _0  (1.702 m)   Wt 170 lb (77.1 kg)   SpO2 98%   BMI 26.63 kg/m  Wt Readings from Last 3 Encounters:  07/11/21 170 lb (77.1 kg)  07/06/21 170 lb (77.1 kg)  03/06/21 177 lb 9.6 oz (80.6 kg)    Physical Exam Vitals reviewed.  Constitutional:      General: She is not in acute distress.    Appearance: Normal appearance.  HENT:     Head: Normocephalic and atraumatic.     Right Ear: External ear normal.     Left Ear: External ear normal.  Eyes:     General: No scleral icterus.       Right eye: No discharge.        Left eye: No discharge.     Conjunctiva/sclera: Conjunctivae normal.  Neck:     Thyroid: No thyromegaly.  Cardiovascular:     Rate and Rhythm: Normal rate and regular rhythm.  Pulmonary:     Effort: No respiratory distress.     Breath sounds: Normal breath sounds. No wheezing.  Abdominal:     General: Bowel sounds are normal.     Palpations: Abdomen is soft.     Tenderness: There is no abdominal tenderness.  Musculoskeletal:        General: No swelling or tenderness.     Cervical back: Neck supple. No tenderness.  Lymphadenopathy:     Cervical: No cervical adenopathy.  Skin:    Findings: No erythema or rash.  Neurological:     Mental Status: She is alert.  Psychiatric:        Mood and Affect: Mood normal.        Behavior: Behavior normal.     Outpatient Encounter Medications as of 07/06/2021  Medication Sig   acetaminophen (TYLENOL) 500 MG tablet Take 1,000 mg by mouth every 6 (six) hours as needed for mild pain or moderate pain.   ALPRAZolam (XANAX) 0.25 MG tablet Take 1 tablet (0.25 mg total) by mouth daily as needed for anxiety.   buPROPion (WELLBUTRIN XL) 150 MG 24 hr tablet Take 1 tablet by mouth once daily   cholecalciferol (VITAMIN D3) 25 MCG (1000 UNIT) tablet Take 2,000 Units by mouth daily.   Cyanocobalamin (B-12 PO) Take by mouth.   EUTHYROX 88 MCG tablet TAKE 1 TABLET BY MOUTH ONCE DAILY BEFORE BREAKFAST   lisinopril (ZESTRIL) 10 MG  tablet Take 1 tablet by  mouth once daily   rosuvastatin (CRESTOR) 10 MG tablet Take 1 tablet (10 mg total) by mouth daily.   [DISCONTINUED] hydrochlorothiazide (MICROZIDE) 12.5 MG capsule Take 1 capsule by mouth once daily   [DISCONTINUED] Semaglutide-Weight Management 2.4 MG/0.75ML SOAJ Inject 2.4 mg into the skin once a week.   No facility-administered encounter medications on file as of 07/06/2021.     Lab Results  Component Value Date   WBC 6.0 04/17/2021   HGB 12.2 04/17/2021   HCT 36.4 04/17/2021   PLT 285.0 04/17/2021   GLUCOSE 79 07/06/2021   CHOL 179 04/17/2021   TRIG 126.0 04/17/2021   HDL 69.30 04/17/2021   LDLDIRECT 178.3 11/11/2013   LDLCALC 85 04/17/2021   ALT 30 04/17/2021   AST 20 04/17/2021   NA 140 07/06/2021   K 4.4 07/06/2021   CL 103 07/06/2021   CREATININE 0.99 07/06/2021   BUN 9 07/06/2021   CO2 22 07/06/2021   TSH 0.890 07/06/2021   HGBA1C 5.7 04/17/2021    US BREAST LTD UNI RIGHT INC AXILLA  Result Date: 03/15/2021 CLINICAL DATA:  Palpable RIGHT breast area, clinically appreciated EXAM: DIGITAL DIAGNOSTIC UNILATERAL RIGHT MAMMOGRAM WITH TOMOSYNTHESIS AND CAD; ULTRASOUND RIGHT BREAST LIMITED TECHNIQUE: Right digital diagnostic mammography and breast tomosynthesis was performed. The images were evaluated with computer-aided detection.; Targeted ultrasound examination of the right breast was performed COMPARISON:  Previous exam(s). ACR Breast Density Category b: There are scattered areas of fibroglandular density. FINDINGS: Spot compression tomosynthesis views were obtained over the palpable area of concern in the RIGHT breast. No suspicious mammographic finding is identified in this area. No suspicious mass, microcalcification, or other finding is identified in the RIGHT breast. On physical exam, no suspicious mass is appreciated. Targeted RIGHT breast ultrasound was performed in the palpable area of concern at the inner breast. No suspicious solid or cystic  mass is identified. IMPRESSION: No mammographic or sonographic evidence of malignancy at the site of palpable concern in the RIGHT breast. Any further workup of the patient's symptoms should be based on the clinical assessment. Recommend routine annual screening mammogram, due August 2022. RECOMMENDATION: Annual screening mammogram, due August 2022 I have discussed the findings and recommendations with the patient. If applicable, a reminder letter will be sent to the patient regarding the next appointment. BI-RADS CATEGORY  1: Negative. Electronically Signed   By: Valentino Saxon MD   On: 03/15/2021 11:09  MM DIAG BREAST TOMO UNI RIGHT  Result Date: 03/15/2021 CLINICAL DATA:  Palpable RIGHT breast area, clinically appreciated EXAM: DIGITAL DIAGNOSTIC UNILATERAL RIGHT MAMMOGRAM WITH TOMOSYNTHESIS AND CAD; ULTRASOUND RIGHT BREAST LIMITED TECHNIQUE: Right digital diagnostic mammography and breast tomosynthesis was performed. The images were evaluated with computer-aided detection.; Targeted ultrasound examination of the right breast was performed COMPARISON:  Previous exam(s). ACR Breast Density Category b: There are scattered areas of fibroglandular density. FINDINGS: Spot compression tomosynthesis views were obtained over the palpable area of concern in the RIGHT breast. No suspicious mammographic finding is identified in this area. No suspicious mass, microcalcification, or other finding is identified in the RIGHT breast. On physical exam, no suspicious mass is appreciated. Targeted RIGHT breast ultrasound was performed in the palpable area of concern at the inner breast. No suspicious solid or cystic mass is identified. IMPRESSION: No mammographic or sonographic evidence of malignancy at the site of palpable concern in the RIGHT breast. Any further workup of the patient's symptoms should be based on the clinical assessment. Recommend routine annual screening mammogram, due August  2022. RECOMMENDATION: Annual  screening mammogram, due August 2022 I have discussed the findings and recommendations with the patient. If applicable, a reminder letter will be sent to the patient regarding the next appointment. BI-RADS CATEGORY  1: Negative. Electronically Signed   By: Valentino Saxon MD   On: 03/15/2021 11:09      Assessment & Plan:   Problem List Items Addressed This Visit     Anemia    Follow cbc.  History of gastric surgery.        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  Check xray.        Relevant Orders   DG Knee 1-2 Views Right (Completed)   Essential hypertension, benign    Has been on lisinopril and hctz.  Occasional light headedness as outlined.  Stop hctz.  Follow pressures.  Continue lisinopril.        Relevant Orders   Basic metabolic panel (Completed)   Hypercholesterolemia    On crestor. Low cholesterol diet and exercise.  Follow lipid panel and liver function tests.        Hyperglycemia    Has lost weight.  Follow met b and a1c.        Hypothyroidism    Has lost weight.  On thyroid replacement.  Recheck tsh today.       Relevant Orders   TSH (Completed)   Mild depression    Continues on wellbutrin.  Follow.  Stable.       Other Visit Diagnoses     Visit for screening mammogram    -  Primary   Relevant Orders   MM 3D SCREEN BREAST BILATERAL        Einar Pheasant, MD

## 2021-07-06 NOTE — Patient Instructions (Signed)
Stop hctz

## 2021-07-07 LAB — TSH: TSH: 0.89 u[IU]/mL (ref 0.450–4.500)

## 2021-07-07 LAB — BASIC METABOLIC PANEL
BUN/Creatinine Ratio: 9 (ref 9–23)
BUN: 9 mg/dL (ref 6–24)
CO2: 22 mmol/L (ref 20–29)
Calcium: 9.6 mg/dL (ref 8.7–10.2)
Chloride: 103 mmol/L (ref 96–106)
Creatinine, Ser: 0.99 mg/dL (ref 0.57–1.00)
Glucose: 79 mg/dL (ref 70–99)
Potassium: 4.4 mmol/L (ref 3.5–5.2)
Sodium: 140 mmol/L (ref 134–144)
eGFR: 71 mL/min/{1.73_m2} (ref >59)

## 2021-07-10 ENCOUNTER — Encounter: Payer: Self-pay | Admitting: Internal Medicine

## 2021-07-11 ENCOUNTER — Telehealth: Payer: Managed Care, Other (non HMO) | Admitting: Physician Assistant

## 2021-07-11 ENCOUNTER — Telehealth: Payer: Managed Care, Other (non HMO) | Admitting: Internal Medicine

## 2021-07-11 ENCOUNTER — Telehealth: Payer: Self-pay | Admitting: Internal Medicine

## 2021-07-11 ENCOUNTER — Other Ambulatory Visit: Payer: Self-pay

## 2021-07-11 ENCOUNTER — Other Ambulatory Visit (INDEPENDENT_AMBULATORY_CARE_PROVIDER_SITE_OTHER): Payer: Managed Care, Other (non HMO)

## 2021-07-11 DIAGNOSIS — N3000 Acute cystitis without hematuria: Secondary | ICD-10-CM

## 2021-07-11 DIAGNOSIS — I10 Essential (primary) hypertension: Secondary | ICD-10-CM

## 2021-07-11 DIAGNOSIS — R300 Dysuria: Secondary | ICD-10-CM

## 2021-07-11 LAB — URINALYSIS, ROUTINE W REFLEX MICROSCOPIC
Bilirubin Urine: NEGATIVE
Ketones, ur: NEGATIVE
Leukocytes,Ua: NEGATIVE
Nitrite: NEGATIVE
Specific Gravity, Urine: 1.01 (ref 1.000–1.030)
Urine Glucose: NEGATIVE
Urobilinogen, UA: 0.2 (ref 0.0–1.0)
pH: 6 (ref 5.0–8.0)

## 2021-07-11 MED ORDER — NITROFURANTOIN MONOHYD MACRO 100 MG PO CAPS: 100.0000 mg | ORAL_CAPSULE | Freq: Two times a day (BID) | ORAL | 0 refills | Status: DC

## 2021-07-11 NOTE — Telephone Encounter (Signed)
Patient informed, Due to the high volume of calls and your symptoms we have to forward your call to our Triage Nurse to expedient your call. Please hold for the transfer.  Patient transferred to [facility] at Access Nurse. Due to thinking she may have a UTI and is requesting to have a urine sample.No openings in office or virtual.

## 2021-07-11 NOTE — Telephone Encounter (Signed)
Placed call to pt to offer appt with another provider in office. Pt stated she could not wait til Friday and will go to the [facility].

## 2021-07-11 NOTE — Telephone Encounter (Signed)
Was able to schedule appointment with another physician today.

## 2021-07-11 NOTE — Telephone Encounter (Signed)
I can work her in this pm.

## 2021-07-11 NOTE — Telephone Encounter (Signed)
Patient has been scheduled with Dr Lorin Picket for VV and coming this AM to leave urine sample. Should be here around 10:30. Urine has been ordered

## 2021-07-11 NOTE — Progress Notes (Signed)
Patient ID: [name] Nielsen, female   DOB: 1976-07-15, 45 y.o.   MRN: [id number]   Subjective:    Patient ID: [name] Nielsen, female    DOB: 06-02-1976, 45 y.o.   MRN: [id number]  This visit occurred during the SARS-CoV-2 public health emergency.  Safety protocols were in place, including screening questions prior to the visit, additional usage of staff PPE, and extensive cleaning of exam room while observing appropriate contact time as indicated for disinfecting solutions.   Patient here for  Chief Complaint  Patient presents with   Urinary Tract Infection    Dysuria, urgency, frequency, nausea. Sx started 2 days ago   .   HPI    Past Medical History:  Diagnosis Date   Anemia    hx of    Anxiety    COVID-19 virus infection 11/2019   Depression    Frequent headaches    hx of   Heart murmur    Hx: UTI (urinary tract infection)    child, s/p urinary procecure   Hypercholesterolemia    Hypertension    Hypothyroidism    Past Surgical History:  Procedure Laterality Date   CHOLECYSTECTOMY  2012   COLONOSCOPY     > 10 yrs ago- normal    LAPAROSCOPIC GASTRIC SLEEVE RESECTION N/A 07/05/2014   Procedure: LAPAROSCOPIC GASTRIC SLEEVE RESECTION with upper endoscopy;  Surgeon: Wenda Low, MD;  Location: [facility];  Service: General;  Laterality: N/A;   TUBAL LIGATION  1999   WISDOM TOOTH EXTRACTION     age 23    Family History  Problem Relation Age of Onset   Cancer Mother        brain   Diabetes Father    Hypertension Father    Kidney disease Father    Stroke Father    Cancer Father        prostate   Prostate cancer Father    Breast cancer Neg Hx    Colon cancer Neg Hx    Colon polyps Neg Hx    Esophageal cancer Neg Hx    Rectal cancer Neg Hx    Stomach cancer Neg Hx    Social History   Socioeconomic History   Marital status: Single    Spouse name: Not on file   Number of children: Not on file   Years of education: Not on file   Highest education level: Not on  file  Occupational History   Not on file  Tobacco Use   Smoking status: Former   Smokeless tobacco: Never  Substance and Sexual Activity   Alcohol use: No    Alcohol/week: 0.0 standard drinks   Drug use: No   Sexual activity: Not on file  Other Topics Concern   Not on file  Social History Narrative   Not on file   Social Determinants of Health   Financial Resource Strain: Not on file  Food Insecurity: Not on file  Transportation Needs: Not on file  Physical Activity: Sufficiently Active   Days of Exercise per Week: 6 days   Minutes of Exercise per Session: 50 min  Stress: No Stress Concern Present   Feeling of Stress : Only a little  Social Connections: Not on file     Review of Systems     Objective:     Ht 5\' 7"  (1.702 m)   Wt 170 lb (77.1 kg)   BMI 26.63 kg/m  Wt Readings from Last 3 Encounters:  07/11/21 170 lb (77.1 kg)  07/06/21 170 lb (77.1 kg)  03/06/21 177 lb 9.6 oz (80.6 kg)    Physical Exam   Outpatient Encounter Medications as of 07/11/2021  Medication Sig   acetaminophen (TYLENOL) 500 MG tablet Take 1,000 mg by mouth every 6 (six) hours as needed for mild pain or moderate pain.   ALPRAZolam (XANAX) 0.25 MG tablet Take 1 tablet (0.25 mg total) by mouth daily as needed for anxiety.   buPROPion (WELLBUTRIN XL) 150 MG 24 hr tablet Take 1 tablet by mouth once daily   cholecalciferol (VITAMIN D3) 25 MCG (1000 UNIT) tablet Take 2,000 Units by mouth daily.   Cyanocobalamin (B-12 PO) Take by mouth.   EUTHYROX 88 MCG tablet TAKE 1 TABLET BY MOUTH ONCE DAILY BEFORE BREAKFAST   lisinopril (ZESTRIL) 10 MG tablet Take 1 tablet by mouth once daily   rosuvastatin (CRESTOR) 10 MG tablet Take 1 tablet (10 mg total) by mouth daily.   Semaglutide-Weight Management 2.4 MG/0.75ML SOAJ Inject 2.4 mg into the skin once a week.   No facility-administered encounter medications on file as of 07/11/2021.     Lab Results  Component Value Date   WBC 6.0 04/17/2021    HGB 12.2 04/17/2021   HCT 36.4 04/17/2021   PLT 285.0 04/17/2021   GLUCOSE 79 07/06/2021   CHOL 179 04/17/2021   TRIG 126.0 04/17/2021   HDL 69.30 04/17/2021   LDLDIRECT 178.3 11/11/2013   LDLCALC 85 04/17/2021   ALT 30 04/17/2021   AST 20 04/17/2021   NA 140 07/06/2021   K 4.4 07/06/2021   CL 103 07/06/2021   CREATININE 0.99 07/06/2021   BUN 9 07/06/2021   CO2 22 07/06/2021   TSH 0.890 07/06/2021   HGBA1C 5.7 04/17/2021    US BREAST LTD UNI RIGHT INC AXILLA  Result Date: 03/15/2021 CLINICAL DATA:  Palpable RIGHT breast area, clinically appreciated EXAM: DIGITAL DIAGNOSTIC UNILATERAL RIGHT MAMMOGRAM WITH TOMOSYNTHESIS AND CAD; ULTRASOUND RIGHT BREAST LIMITED TECHNIQUE: Right digital diagnostic mammography and breast tomosynthesis was performed. The images were evaluated with computer-aided detection.; Targeted ultrasound examination of the right breast was performed COMPARISON:  Previous exam(s). ACR Breast Density Category b: There are scattered areas of fibroglandular density. FINDINGS: Spot compression tomosynthesis views were obtained over the palpable area of concern in the RIGHT breast. No suspicious mammographic finding is identified in this area. No suspicious mass, microcalcification, or other finding is identified in the RIGHT breast. On physical exam, no suspicious mass is appreciated. Targeted RIGHT breast ultrasound was performed in the palpable area of concern at the inner breast. No suspicious solid or cystic mass is identified. IMPRESSION: No mammographic or sonographic evidence of malignancy at the site of palpable concern in the RIGHT breast. Any further workup of the patient's symptoms should be based on the clinical assessment. Recommend routine annual screening mammogram, due August 2022. RECOMMENDATION: Annual screening mammogram, due August 2022 I have discussed the findings and recommendations with the patient. If applicable, a reminder letter will be sent to the patient  regarding the next appointment. BI-RADS CATEGORY  1: Negative. Electronically Signed   By: Meda Klinefelter MD   On: 03/15/2021 11:09  MM DIAG BREAST TOMO UNI RIGHT  Result Date: 03/15/2021 CLINICAL DATA:  Palpable RIGHT breast area, clinically appreciated EXAM: DIGITAL DIAGNOSTIC UNILATERAL RIGHT MAMMOGRAM WITH TOMOSYNTHESIS AND CAD; ULTRASOUND RIGHT BREAST LIMITED TECHNIQUE: Right digital diagnostic mammography and breast tomosynthesis was performed. The images were evaluated with computer-aided detection.; Targeted ultrasound examination of the right breast was performed COMPARISON:  Previous exam(s). ACR Breast Density Category b: There are scattered areas of fibroglandular density. FINDINGS: Spot compression tomosynthesis views were obtained over the palpable area of concern in the RIGHT breast. No suspicious mammographic finding is identified in this area. No suspicious mass, microcalcification, or other finding is identified in the RIGHT breast. On physical exam, no suspicious mass is appreciated. Targeted RIGHT breast ultrasound was performed in the palpable area of concern at the inner breast. No suspicious solid or cystic mass is identified. IMPRESSION: No mammographic or sonographic evidence of malignancy at the site of palpable concern in the RIGHT breast. Any further workup of the patient's symptoms should be based on the clinical assessment. Recommend routine annual screening mammogram, due August 2022. RECOMMENDATION: Annual screening mammogram, due August 2022 I have discussed the findings and recommendations with the patient. If applicable, a reminder letter will be sent to the patient regarding the next appointment. BI-RADS CATEGORY  1: Negative. Electronically Signed   By: Meda Klinefelter MD   On: 03/15/2021 11:09      Assessment & Plan:   Problem List Items Addressed This Visit   None    Dale [place], MD

## 2021-07-11 NOTE — Telephone Encounter (Signed)
Providing access nurse documentation.      

## 2021-07-13 LAB — URINE CULTURE
MICRO NUMBER:: 12464446
SPECIMEN QUALITY:: ADEQUATE

## 2021-07-15 ENCOUNTER — Encounter: Payer: Self-pay | Admitting: Internal Medicine

## 2021-07-15 ENCOUNTER — Other Ambulatory Visit: Payer: Self-pay | Admitting: Internal Medicine

## 2021-07-15 DIAGNOSIS — R319 Hematuria, unspecified: Secondary | ICD-10-CM

## 2021-07-15 NOTE — Assessment & Plan Note (Signed)
Has been on lisinopril and hctz.  Occasional light headedness as outlined.  Stop hctz.  Follow pressures.  Continue lisinopril.

## 2021-07-15 NOTE — Assessment & Plan Note (Signed)
Has lost weight.  On thyroid replacement.  Recheck tsh today.

## 2021-07-15 NOTE — Progress Notes (Signed)
Order placed for f/u urinalysis 

## 2021-07-15 NOTE — Assessment & Plan Note (Signed)
Symptoms and urinalysis c/w UTI.  Treat with macrobid.  Stay hydrated.  Await urine culture.  Follow.

## 2021-07-15 NOTE — Progress Notes (Signed)
Patient ID: [name] Nielsen, female   DOB: 1975-10-23, 45 y.o.   MRN: [id number]   Virtual Visit via video Note  This visit type was conducted due to national recommendations for restrictions regarding the COVID-19 pandemic (e.g. social distancing).  This format is felt to be most appropriate for this patient at this time.  All issues noted in this document were discussed and addressed.  No physical exam was performed (except for noted visual exam findings with Video Visits).   I connected with [name] Nielsen by a video enabled telemedicine application and verified that I am speaking with the correct person using two identifiers. Location patient: home Location provider: work  Persons participating in the virtual visit: patient, provider  The limitations, risks, security and privacy concerns of performing an evaluation and management service by video and the availability of in person appointments have been discussed. It has also been discussed with the patient that there may be a patient responsible charge related to this service. The patient expressed understanding and agreed to proceed.   Reason for visit: work in appt.   HPI: Work in with concerns regarding UTI.  States symptoms started a couple of days ago.  Noticed discomfort with end urination.  Increased frequency.  Some nausea.  No vomiting.  No abdominal pain.  No vomiting.  No hematuria.  No vaginal symptoms.  Eating and drinking.    ROS: See pertinent positives and negatives per HPI.  Past Medical History:  Diagnosis Date   Anemia    hx of    Anxiety    COVID-19 virus infection 11/2019   Depression    Frequent headaches    hx of   Heart murmur    Hx: UTI (urinary tract infection)    child, s/p urinary procecure   Hypercholesterolemia    Hypertension    Hypothyroidism     Past Surgical History:  Procedure Laterality Date   CHOLECYSTECTOMY  2012   COLONOSCOPY     > 10 yrs ago- normal    LAPAROSCOPIC GASTRIC SLEEVE  RESECTION N/A 07/05/2014   Procedure: LAPAROSCOPIC GASTRIC SLEEVE RESECTION with upper endoscopy;  Surgeon: Wenda Low, MD;  Location: [facility];  Service: General;  Laterality: N/A;   TUBAL LIGATION  1999   WISDOM TOOTH EXTRACTION     age 31     Family History  Problem Relation Age of Onset   Cancer Mother        brain   Diabetes Father    Hypertension Father    Kidney disease Father    Stroke Father    Cancer Father        prostate   Prostate cancer Father    Breast cancer Neg Hx    Colon cancer Neg Hx    Colon polyps Neg Hx    Esophageal cancer Neg Hx    Rectal cancer Neg Hx    Stomach cancer Neg Hx     SOCIAL HX: reviewed.    Current Outpatient Medications:    nitrofurantoin, macrocrystal-monohydrate, (MACROBID) 100 MG capsule, Take 1 capsule (100 mg total) by mouth 2 (two) times daily., Disp: 10 capsule, Rfl: 0   acetaminophen (TYLENOL) 500 MG tablet, Take 1,000 mg by mouth every 6 (six) hours as needed for mild pain or moderate pain., Disp: , Rfl:    ALPRAZolam (XANAX) 0.25 MG tablet, Take 1 tablet (0.25 mg total) by mouth daily as needed for anxiety., Disp: 30 tablet, Rfl: 0   buPROPion (WELLBUTRIN XL) 150 MG  24 hr tablet, Take 1 tablet by mouth once daily, Disp: 90 tablet, Rfl: 0   cholecalciferol (VITAMIN D3) 25 MCG (1000 UNIT) tablet, Take 2,000 Units by mouth daily., Disp: , Rfl:    Cyanocobalamin (B-12 PO), Take by mouth., Disp: , Rfl:    EUTHYROX 88 MCG tablet, TAKE 1 TABLET BY MOUTH ONCE DAILY BEFORE BREAKFAST, Disp: 90 tablet, Rfl: 0   lisinopril (ZESTRIL) 10 MG tablet, Take 1 tablet by mouth once daily, Disp: 90 tablet, Rfl: 0   rosuvastatin (CRESTOR) 10 MG tablet, Take 1 tablet (10 mg total) by mouth daily., Disp: 90 tablet, Rfl: 3   Semaglutide-Weight Management 2.4 MG/0.75ML SOAJ, Inject 2.4 mg into the skin once a week., Disp: 9 mL, Rfl: 3  EXAM:  GENERAL: alert, oriented, appears well and in no acute distress  HEENT: atraumatic, conjunttiva clear, no  obvious abnormalities on inspection of external nose and ears  NECK: normal movements of the head and neck  LUNGS: on inspection no signs of respiratory distress, breathing rate appears normal, no obvious gross SOB, gasping or wheezing  CV: no obvious cyanosis  PSYCH/NEURO: pleasant and cooperative, no obvious depression or anxiety, speech and thought processing grossly intact  ASSESSMENT AND PLAN:  Discussed the following assessment and plan:  Problem List Items Addressed This Visit     Essential hypertension, benign    Continue lisinopril.  Off hctz.  Follow pressures.       UTI (urinary tract infection)    Symptoms and urinalysis c/w UTI.  Treat with macrobid.  Stay hydrated.  Await urine culture.  Follow.       Relevant Medications   nitrofurantoin, macrocrystal-monohydrate, (MACROBID) 100 MG capsule    Return if symptoms worsen or fail to improve.   I discussed the assessment and treatment plan with the patient. The patient was provided an opportunity to ask questions and all were answered. The patient agreed with the plan and demonstrated an understanding of the instructions.   The patient was advised to call back or seek an in-person evaluation if the symptoms worsen or if the condition fails to improve as anticipated.    Dale [place], MD

## 2021-07-15 NOTE — Assessment & Plan Note (Signed)
On crestor.  Low cholesterol diet and exercise.  Follow lipid panel and liver function tests.   

## 2021-07-15 NOTE — Assessment & Plan Note (Signed)
Bony protuberance on right knee as outlined.  Check xray.

## 2021-07-15 NOTE — Assessment & Plan Note (Signed)
Continue lisinopril.  Off hctz.  Follow pressures.

## 2021-07-15 NOTE — Assessment & Plan Note (Signed)
Has lost weight.  Follow met b and a1c.   

## 2021-07-15 NOTE — Assessment & Plan Note (Signed)
Continues on wellbutrin.  Follow.  Stable.  

## 2021-07-15 NOTE — Assessment & Plan Note (Signed)
Follow cbc.  History of gastric surgery.  

## 2021-08-01 ENCOUNTER — Other Ambulatory Visit: Payer: Managed Care, Other (non HMO)

## 2021-08-12 ENCOUNTER — Other Ambulatory Visit: Payer: Self-pay | Admitting: Internal Medicine

## 2021-08-20 ENCOUNTER — Encounter: Payer: Self-pay | Admitting: Internal Medicine

## 2021-08-20 ENCOUNTER — Telehealth: Payer: Managed Care, Other (non HMO) | Admitting: Internal Medicine

## 2021-08-20 ENCOUNTER — Other Ambulatory Visit: Payer: Self-pay

## 2021-08-20 DIAGNOSIS — I10 Essential (primary) hypertension: Secondary | ICD-10-CM

## 2021-08-20 DIAGNOSIS — F339 Major depressive disorder, recurrent, unspecified: Secondary | ICD-10-CM | POA: Diagnosis not present

## 2021-08-20 MED ORDER — BUPROPION HCL ER (XL) 300 MG PO TB24: 300.0000 mg | ORAL_TABLET | Freq: Every day | ORAL | 2 refills | Status: DC

## 2021-08-20 NOTE — Telephone Encounter (Signed)
Please see if she will do a 4:00 appt today to discuss.

## 2021-08-20 NOTE — Progress Notes (Deleted)
Patient ID: [name] Nielsen, female   DOB: March 23, 1976, 45 y.o.   MRN: [id number]   Subjective:    Patient ID: [name] Nielsen, female    DOB: 1976-03-17, 45 y.o.   MRN: [id number]  This visit occurred during the SARS-CoV-2 public health emergency.  Safety protocols were in place, including screening questions prior to the visit, additional usage of staff PPE, and extensive cleaning of exam room while observing appropriate contact time as indicated for disinfecting solutions.   Patient here for  Chief Complaint  Patient presents with   Acute Visit    depression   .   HPI    Past Medical History:  Diagnosis Date   Anemia    hx of    Anxiety    COVID-19 virus infection 11/2019   Depression    Frequent headaches    hx of   Heart murmur    Hx: UTI (urinary tract infection)    child, s/p urinary procecure   Hypercholesterolemia    Hypertension    Hypothyroidism    Past Surgical History:  Procedure Laterality Date   CHOLECYSTECTOMY  2012   COLONOSCOPY     > 10 yrs ago- normal    LAPAROSCOPIC GASTRIC SLEEVE RESECTION N/A 07/05/2014   Procedure: LAPAROSCOPIC GASTRIC SLEEVE RESECTION with upper endoscopy;  Surgeon: Wenda Low, MD;  Location: [facility];  Service: General;  Laterality: N/A;   TUBAL LIGATION  1999   WISDOM TOOTH EXTRACTION     age 4    Family History  Problem Relation Age of Onset   Cancer Mother        brain   Diabetes Father    Hypertension Father    Kidney disease Father    Stroke Father    Cancer Father        prostate   Prostate cancer Father    Breast cancer Neg Hx    Colon cancer Neg Hx    Colon polyps Neg Hx    Esophageal cancer Neg Hx    Rectal cancer Neg Hx    Stomach cancer Neg Hx    Social History   Socioeconomic History   Marital status: Single    Spouse name: Not on file   Number of children: Not on file   Years of education: Not on file   Highest education level: Not on file  Occupational History   Not on file  Tobacco Use    Smoking status: Former   Smokeless tobacco: Never  Substance and Sexual Activity   Alcohol use: No    Alcohol/week: 0.0 standard drinks   Drug use: No   Sexual activity: Not on file  Other Topics Concern   Not on file  Social History Narrative   Not on file   Social Determinants of Health   Financial Resource Strain: Not on file  Food Insecurity: Not on file  Transportation Needs: Not on file  Physical Activity: Sufficiently Active   Days of Exercise per Week: 6 days   Minutes of Exercise per Session: 50 min  Stress: No Stress Concern Present   Feeling of Stress : Only a little  Social Connections: Not on file     Review of Systems     Objective:     Ht 5\' 7"  (1.702 m)   Wt 170 lb (77.1 kg)   LMP 08/07/2021 (Approximate)   BMI 26.63 kg/m  Wt Readings from Last 3 Encounters:  08/20/21 170 lb (77.1 kg)  07/11/21 170 lb (77.1  kg)  07/06/21 170 lb (77.1 kg)    Physical Exam   Outpatient Encounter Medications as of 08/20/2021  Medication Sig   acetaminophen (TYLENOL) 500 MG tablet Take 1,000 mg by mouth every 6 (six) hours as needed for mild pain or moderate pain.   ALPRAZolam (XANAX) 0.25 MG tablet Take 1 tablet (0.25 mg total) by mouth daily as needed for anxiety.   cholecalciferol (VITAMIN D3) 25 MCG (1000 UNIT) tablet Take 2,000 Units by mouth daily.   Cyanocobalamin (B-12 PO) Take by mouth.   levothyroxine (SYNTHROID) 88 MCG tablet TAKE 1 TABLET BY MOUTH ONCE DAILY BEFORE BREAKFAST   lisinopril (ZESTRIL) 10 MG tablet Take 1 tablet by mouth once daily   rosuvastatin (CRESTOR) 10 MG tablet Take 1 tablet (10 mg total) by mouth daily.   Semaglutide-Weight Management 2.4 MG/0.75ML SOAJ Inject 2.4 mg into the skin once a week.   buPROPion (WELLBUTRIN XL) 150 MG 24 hr tablet Take 1 tablet by mouth once daily (Patient not taking: Reported on 08/20/2021)   nitrofurantoin, macrocrystal-monohydrate, (MACROBID) 100 MG capsule Take 1 capsule (100 mg total) by mouth 2 (two)  times daily. (Patient not taking: Reported on 08/20/2021)   No facility-administered encounter medications on file as of 08/20/2021.     Lab Results  Component Value Date   WBC 6.0 04/17/2021   HGB 12.2 04/17/2021   HCT 36.4 04/17/2021   PLT 285.0 04/17/2021   GLUCOSE 79 07/06/2021   CHOL 179 04/17/2021   TRIG 126.0 04/17/2021   HDL 69.30 04/17/2021   LDLDIRECT 178.3 11/11/2013   LDLCALC 85 04/17/2021   ALT 30 04/17/2021   AST 20 04/17/2021   NA 140 07/06/2021   K 4.4 07/06/2021   CL 103 07/06/2021   CREATININE 0.99 07/06/2021   BUN 9 07/06/2021   CO2 22 07/06/2021   TSH 0.890 07/06/2021   HGBA1C 5.7 04/17/2021    US BREAST LTD UNI RIGHT INC AXILLA  Result Date: 03/15/2021 CLINICAL DATA:  Palpable RIGHT breast area, clinically appreciated EXAM: DIGITAL DIAGNOSTIC UNILATERAL RIGHT MAMMOGRAM WITH TOMOSYNTHESIS AND CAD; ULTRASOUND RIGHT BREAST LIMITED TECHNIQUE: Right digital diagnostic mammography and breast tomosynthesis was performed. The images were evaluated with computer-aided detection.; Targeted ultrasound examination of the right breast was performed COMPARISON:  Previous exam(s). ACR Breast Density Category b: There are scattered areas of fibroglandular density. FINDINGS: Spot compression tomosynthesis views were obtained over the palpable area of concern in the RIGHT breast. No suspicious mammographic finding is identified in this area. No suspicious mass, microcalcification, or other finding is identified in the RIGHT breast. On physical exam, no suspicious mass is appreciated. Targeted RIGHT breast ultrasound was performed in the palpable area of concern at the inner breast. No suspicious solid or cystic mass is identified. IMPRESSION: No mammographic or sonographic evidence of malignancy at the site of palpable concern in the RIGHT breast. Any further workup of the patient's symptoms should be based on the clinical assessment. Recommend routine annual screening mammogram, due  August 2022. RECOMMENDATION: Annual screening mammogram, due August 2022 I have discussed the findings and recommendations with the patient. If applicable, a reminder letter will be sent to the patient regarding the next appointment. BI-RADS CATEGORY  1: Negative. Electronically Signed   By: Meda Klinefelter MD   On: 03/15/2021 11:09  MM DIAG BREAST TOMO UNI RIGHT  Result Date: 03/15/2021 CLINICAL DATA:  Palpable RIGHT breast area, clinically appreciated EXAM: DIGITAL DIAGNOSTIC UNILATERAL RIGHT MAMMOGRAM WITH TOMOSYNTHESIS AND CAD; ULTRASOUND RIGHT BREAST LIMITED  TECHNIQUE: Right digital diagnostic mammography and breast tomosynthesis was performed. The images were evaluated with computer-aided detection.; Targeted ultrasound examination of the right breast was performed COMPARISON:  Previous exam(s). ACR Breast Density Category b: There are scattered areas of fibroglandular density. FINDINGS: Spot compression tomosynthesis views were obtained over the palpable area of concern in the RIGHT breast. No suspicious mammographic finding is identified in this area. No suspicious mass, microcalcification, or other finding is identified in the RIGHT breast. On physical exam, no suspicious mass is appreciated. Targeted RIGHT breast ultrasound was performed in the palpable area of concern at the inner breast. No suspicious solid or cystic mass is identified. IMPRESSION: No mammographic or sonographic evidence of malignancy at the site of palpable concern in the RIGHT breast. Any further workup of the patient's symptoms should be based on the clinical assessment. Recommend routine annual screening mammogram, due August 2022. RECOMMENDATION: Annual screening mammogram, due August 2022 I have discussed the findings and recommendations with the patient. If applicable, a reminder letter will be sent to the patient regarding the next appointment. BI-RADS CATEGORY  1: Negative. Electronically Signed   By: Meda Klinefelter MD    On: 03/15/2021 11:09      Assessment & Plan:   Problem List Items Addressed This Visit   None    Dale [place], MD

## 2021-08-20 NOTE — Telephone Encounter (Signed)
Need to know what is going on.  Is she doing ok?  Confirm currently taking 150mg  q day.  What problems is she having?

## 2021-08-26 ENCOUNTER — Encounter: Payer: Self-pay | Admitting: Internal Medicine

## 2021-08-26 DIAGNOSIS — F339 Major depressive disorder, recurrent, unspecified: Secondary | ICD-10-CM | POA: Insufficient documentation

## 2021-08-26 NOTE — Assessment & Plan Note (Signed)
Discussed with her today.  No SI.  On wellbutrin.  Seeing a therapist.  Increase wellbutrin to 300mg  q day.  Follow.  Call with update.

## 2021-08-26 NOTE — Progress Notes (Signed)
Patient ID: [name] Nielsen, female   DOB: 01-03-1976, 45 y.o.   MRN: [id number]   Virtual Visit via virtual Note  This visit type was conducted due to national recommendations for restrictions regarding the COVID-19 pandemic (e.g. social distancing).  This format is felt to be most appropriate for this patient at this time.  All issues noted in this document were discussed and addressed.  No physical exam was performed (except for noted visual exam findings with Video Visits).   I connected with [name] Nielsen by a video enabled telemedicine application and verified that I am speaking with the correct person using two identifiers. Location patient: home Location provider: work Persons participating in the virtual visit: patient, provider  The limitations, risks, security and privacy concerns of performing an evaluation and management service by video and the availability of in person appointments have been discussed.  It has also been discussed with the patient that there may be a patient responsible charge related to this service. The patient expressed understanding and agreed to proceed.   Reason for visit: work in appt.   HPI: Increased stress.  Increased problems with depression.  Going through divorce.  Appears to be handling this well.  Has good support.  Feelings worse with time change.  Seeing a therapist.  Thomes Lolling to get up.  She is sleeping.  Walking.  No suicidal ideations.  On wellbutrin.  Discussed increasing the dose.     ROS: See pertinent positives and negatives per HPI.  Past Medical History:  Diagnosis Date   Anemia    hx of    Anxiety    COVID-19 virus infection 11/2019   Depression    Frequent headaches    hx of   Heart murmur    Hx: UTI (urinary tract infection)    child, s/p urinary procecure   Hypercholesterolemia    Hypertension    Hypothyroidism     Past Surgical History:  Procedure Laterality Date   CHOLECYSTECTOMY  2012   COLONOSCOPY     > 10 yrs  ago- normal    LAPAROSCOPIC GASTRIC SLEEVE RESECTION N/A 07/05/2014   Procedure: LAPAROSCOPIC GASTRIC SLEEVE RESECTION with upper endoscopy;  Surgeon: Wenda Low, MD;  Location: [facility];  Service: General;  Laterality: N/A;   TUBAL LIGATION  1999   WISDOM TOOTH EXTRACTION     age 38     Family History  Problem Relation Age of Onset   Cancer Mother        brain   Diabetes Father    Hypertension Father    Kidney disease Father    Stroke Father    Cancer Father        prostate   Prostate cancer Father    Breast cancer Neg Hx    Colon cancer Neg Hx    Colon polyps Neg Hx    Esophageal cancer Neg Hx    Rectal cancer Neg Hx    Stomach cancer Neg Hx     SOCIAL HX: reviewed.    Current Outpatient Medications:    acetaminophen (TYLENOL) 500 MG tablet, Take 1,000 mg by mouth every 6 (six) hours as needed for mild pain or moderate pain., Disp: , Rfl:    ALPRAZolam (XANAX) 0.25 MG tablet, Take 1 tablet (0.25 mg total) by mouth daily as needed for anxiety., Disp: 30 tablet, Rfl: 0   buPROPion (WELLBUTRIN XL) 300 MG 24 hr tablet, Take 1 tablet (300 mg total) by mouth daily., Disp: 30 tablet, Rfl: 2  cholecalciferol (VITAMIN D3) 25 MCG (1000 UNIT) tablet, Take 2,000 Units by mouth daily., Disp: , Rfl:    Cyanocobalamin (B-12 PO), Take by mouth., Disp: , Rfl:    levothyroxine (SYNTHROID) 88 MCG tablet, TAKE 1 TABLET BY MOUTH ONCE DAILY BEFORE BREAKFAST, Disp: 90 tablet, Rfl: 0   lisinopril (ZESTRIL) 10 MG tablet, Take 1 tablet by mouth once daily, Disp: 90 tablet, Rfl: 0   rosuvastatin (CRESTOR) 10 MG tablet, Take 1 tablet (10 mg total) by mouth daily., Disp: 90 tablet, Rfl: 3   Semaglutide-Weight Management 2.4 MG/0.75ML SOAJ, Inject 2.4 mg into the skin once a week., Disp: 9 mL, Rfl: 3  EXAM:  GENERAL: alert, oriented, appears well and in no acute distress  HEENT: atraumatic, conjunttiva clear, no obvious abnormalities on inspection of external nose and ears  NECK: normal movements  of the head and neck  LUNGS: on inspection no signs of respiratory distress, breathing rate appears normal, no obvious gross SOB, gasping or wheezing  CV: no obvious cyanosis  PSYCH/NEURO: pleasant and cooperative, no obvious depression or anxiety, speech and thought processing grossly intact  ASSESSMENT AND PLAN:  Discussed the following assessment and plan:  Problem List Items Addressed This Visit     Depression, recurrent (HCC)    Discussed with her today.  No SI.  On wellbutrin.  Seeing a therapist.  Increase wellbutrin to 300mg  q day.  Follow.  Call with update.        Relevant Medications   buPROPion (WELLBUTRIN XL) 300 MG 24 hr tablet   Essential hypertension, benign    Continue lisinopril.  Off hctz.  Follow pressures.        Return if symptoms worsen or fail to improve, for keep scheduled.   I discussed the assessment and treatment plan with the patient. The patient was provided an opportunity to ask questions and all were answered. The patient agreed with the plan and demonstrated an understanding of the instructions.   The patient was advised to call back or seek an in-person evaluation if the symptoms worsen or if the condition fails to improve as anticipated.    , MD

## 2021-08-26 NOTE — Assessment & Plan Note (Signed)
Continue lisinopril.  Off hctz.  Follow pressures.

## 2021-09-15 ENCOUNTER — Other Ambulatory Visit: Payer: Self-pay | Admitting: Internal Medicine

## 2021-09-15 DIAGNOSIS — E7800 Pure hypercholesterolemia, unspecified: Secondary | ICD-10-CM

## 2021-10-14 ENCOUNTER — Other Ambulatory Visit: Payer: Self-pay | Admitting: Internal Medicine

## 2021-10-14 DIAGNOSIS — E7800 Pure hypercholesterolemia, unspecified: Secondary | ICD-10-CM

## 2021-10-26 ENCOUNTER — Ambulatory Visit (INDEPENDENT_AMBULATORY_CARE_PROVIDER_SITE_OTHER): Payer: Managed Care, Other (non HMO) | Admitting: Internal Medicine

## 2021-10-26 ENCOUNTER — Other Ambulatory Visit: Payer: Self-pay

## 2021-10-26 VITALS — BP 118/70 | HR 76 | Temp 97.9°F | Resp 16 | Ht 67.0 in | Wt 165.8 lb

## 2021-10-26 DIAGNOSIS — E039 Hypothyroidism, unspecified: Secondary | ICD-10-CM | POA: Diagnosis not present

## 2021-10-26 DIAGNOSIS — F339 Major depressive disorder, recurrent, unspecified: Secondary | ICD-10-CM

## 2021-10-26 DIAGNOSIS — E7800 Pure hypercholesterolemia, unspecified: Secondary | ICD-10-CM | POA: Diagnosis not present

## 2021-10-26 DIAGNOSIS — D649 Anemia, unspecified: Secondary | ICD-10-CM

## 2021-10-26 DIAGNOSIS — Z9884 Bariatric surgery status: Secondary | ICD-10-CM

## 2021-10-26 DIAGNOSIS — I10 Essential (primary) hypertension: Secondary | ICD-10-CM

## 2021-10-26 DIAGNOSIS — Z0000 Encounter for general adult medical examination without abnormal findings: Secondary | ICD-10-CM | POA: Diagnosis not present

## 2021-10-26 DIAGNOSIS — R739 Hyperglycemia, unspecified: Secondary | ICD-10-CM | POA: Diagnosis not present

## 2021-10-26 NOTE — Progress Notes (Signed)
Patient ID: [name] Nielsen, female   DOB: 07-02-1976, 46 y.o.   MRN: [id number]   Subjective:    Patient ID: [name] Nielsen, female    DOB: 1976-06-23, 46 y.o.   MRN: [id number]  This visit occurred during the SARS-CoV-2 public health emergency.  Safety protocols were in place, including screening questions prior to the visit, additional usage of staff PPE, and extensive cleaning of exam room while observing appropriate contact time as indicated for disinfecting solutions.   Patient here for her physical exam.   Chief Complaint  Patient presents with   Annual Exam   .   HPI Reports she is doing relatively well.  Has been dealing with increased stress and depression.  Wellbutrin increased to 394m last visit.  Discussed.  Overall appears to be handling things relatively well.  Has support.  Continuing to lose weight.  No chest pain or sob reported.  Has noticed some light headedness when she bends over and then comes up.  No persistent dizziness.  No acid reflux reported.  No abdominal pain or bowel change reported.  Due f/u mammogram.     Past Medical History:  Diagnosis Date   Anemia    hx of    Anxiety    COVID-19 virus infection 11/2019   Depression    Frequent headaches    hx of   Heart murmur    Hx: UTI (urinary tract infection)    child, s/p urinary procecure   Hypercholesterolemia    Hypertension    Hypothyroidism    Past Surgical History:  Procedure Laterality Date   CHOLECYSTECTOMY  2012   COLONOSCOPY     > 10 yrs ago- normal    LAPAROSCOPIC GASTRIC SLEEVE RESECTION N/A 07/05/2014   Procedure: LAPAROSCOPIC GASTRIC SLEEVE RESECTION with upper endoscopy;  Surgeon: MKaylyn Lim MD;  Location: [facility];  Service: General;  Laterality: N/A;   TUBAL LIGATION  1999   WISDOM TOOTH EXTRACTION     age 46   Family History  Problem Relation Age of Onset   Cancer Mother        brain   Diabetes Father    Hypertension Father    Kidney disease Father    Stroke Father     Cancer Father        prostate   Prostate cancer Father    Breast cancer Neg Hx    Colon cancer Neg Hx    Colon polyps Neg Hx    Esophageal cancer Neg Hx    Rectal cancer Neg Hx    Stomach cancer Neg Hx    Social History   Socioeconomic History   Marital status: Single    Spouse name: Not on file   Number of children: Not on file   Years of education: Not on file   Highest education level: Not on file  Occupational History   Not on file  Tobacco Use   Smoking status: Former   Smokeless tobacco: Never  Substance and Sexual Activity   Alcohol use: No    Alcohol/week: 0.0 standard drinks   Drug use: No   Sexual activity: Not on file  Other Topics Concern   Not on file  Social History Narrative   Not on file   Social Determinants of Health   Financial Resource Strain: Not on file  Food Insecurity: Not on file  Transportation Needs: Not on file  Physical Activity: Sufficiently Active   Days of Exercise per Week: 6 days  Minutes of Exercise per Session: 50 min  Stress: No Stress Concern Present   Feeling of Stress : Only a little  Social Connections: Not on file     Review of Systems  Constitutional:  Negative for appetite change and unexpected weight change.  HENT:  Negative for congestion, sinus pressure and sore throat.   Eyes:  Negative for pain and visual disturbance.  Respiratory:  Negative for cough, chest tightness and shortness of breath.   Cardiovascular:  Negative for chest pain, palpitations and leg swelling.  Gastrointestinal:  Negative for abdominal pain, diarrhea, nausea and vomiting.  Genitourinary:  Negative for difficulty urinating and dysuria.  Musculoskeletal:  Negative for joint swelling and myalgias.  Skin:  Negative for color change and rash.  Neurological:  Negative for dizziness and headaches.       Occasional light headedness as outlined.    Hematological:  Negative for adenopathy. Does not bruise/bleed easily.  Psychiatric/Behavioral:   Negative for agitation and dysphoric mood.       Objective:     BP 118/70    Pulse 76    Temp 97.9 F (36.6 C)    Resp 16    Ht '5\' 7"'  (1.702 m)    Wt 165 lb 12.8 oz (75.2 kg)    SpO2 98%    BMI 25.97 kg/m  Wt Readings from Last 3 Encounters:  10/26/21 165 lb 12.8 oz (75.2 kg)  08/20/21 170 lb (77.1 kg)  07/11/21 170 lb (77.1 kg)    Physical Exam Vitals reviewed.  Constitutional:      General: She is not in acute distress.    Appearance: Normal appearance. She is well-developed.  HENT:     Head: Normocephalic and atraumatic.     Right Ear: External ear normal.     Left Ear: External ear normal.  Eyes:     General: No scleral icterus.       Right eye: No discharge.        Left eye: No discharge.     Conjunctiva/sclera: Conjunctivae normal.  Neck:     Thyroid: No thyromegaly.  Cardiovascular:     Rate and Rhythm: Normal rate and regular rhythm.  Pulmonary:     Effort: No tachypnea, accessory muscle usage or respiratory distress.     Breath sounds: Normal breath sounds. No decreased breath sounds or wheezing.  Chest:  Breasts:    Right: No inverted nipple, mass, nipple discharge or tenderness (no axillary adenopathy).     Left: No inverted nipple, mass, nipple discharge or tenderness (no axilarry adenopathy).  Abdominal:     General: Bowel sounds are normal.     Palpations: Abdomen is soft.     Tenderness: There is no abdominal tenderness.  Musculoskeletal:        General: No swelling or tenderness.     Cervical back: Neck supple. No tenderness.  Lymphadenopathy:     Cervical: No cervical adenopathy.  Skin:    Findings: No erythema or rash.  Neurological:     Mental Status: She is alert and oriented to person, place, and time.  Psychiatric:        Mood and Affect: Mood normal.        Behavior: Behavior normal.     Outpatient Encounter Medications as of 10/26/2021  Medication Sig   acetaminophen (TYLENOL) 500 MG tablet Take 1,000 mg by mouth every 6 (six) hours  as needed for mild pain or moderate pain.   ALPRAZolam (XANAX) 0.25 MG  tablet Take 1 tablet (0.25 mg total) by mouth daily as needed for anxiety.   buPROPion (WELLBUTRIN XL) 300 MG 24 hr tablet Take 1 tablet (300 mg total) by mouth daily.   cholecalciferol (VITAMIN D3) 25 MCG (1000 UNIT) tablet Take 2,000 Units by mouth daily.   Cyanocobalamin (B-12 PO) Take by mouth.   levothyroxine (SYNTHROID) 88 MCG tablet TAKE 1 TABLET BY MOUTH ONCE DAILY BEFORE BREAKFAST   lisinopril (ZESTRIL) 10 MG tablet Take 1 tablet by mouth once daily   rosuvastatin (CRESTOR) 10 MG tablet Take 1 tablet by mouth once daily   Semaglutide-Weight Management 2.4 MG/0.75ML SOAJ Inject 2.4 mg into the skin once a week.   No facility-administered encounter medications on file as of 10/26/2021.     Lab Results  Component Value Date   WBC 6.0 04/17/2021   HGB 12.2 04/17/2021   HCT 36.4 04/17/2021   PLT 285.0 04/17/2021   GLUCOSE 79 10/26/2021   CHOL 174 10/26/2021   TRIG 58 10/26/2021   HDL 79 10/26/2021   LDLDIRECT 178.3 11/11/2013   LDLCALC 84 10/26/2021   ALT 25 10/26/2021   AST 19 10/26/2021   NA 140 10/26/2021   K 4.3 10/26/2021   CL 104 10/26/2021   CREATININE 0.98 10/26/2021   BUN 5 (L) 10/26/2021   CO2 24 10/26/2021   TSH 2.540 10/26/2021   HGBA1C 5.8 (H) 10/26/2021    US BREAST LTD UNI RIGHT INC AXILLA  Result Date: 03/15/2021 CLINICAL DATA:  Palpable RIGHT breast area, clinically appreciated EXAM: DIGITAL DIAGNOSTIC UNILATERAL RIGHT MAMMOGRAM WITH TOMOSYNTHESIS AND CAD; ULTRASOUND RIGHT BREAST LIMITED TECHNIQUE: Right digital diagnostic mammography and breast tomosynthesis was performed. The images were evaluated with computer-aided detection.; Targeted ultrasound examination of the right breast was performed COMPARISON:  Previous exam(s). ACR Breast Density Category b: There are scattered areas of fibroglandular density. FINDINGS: Spot compression tomosynthesis views were obtained over the palpable  area of concern in the RIGHT breast. No suspicious mammographic finding is identified in this area. No suspicious mass, microcalcification, or other finding is identified in the RIGHT breast. On physical exam, no suspicious mass is appreciated. Targeted RIGHT breast ultrasound was performed in the palpable area of concern at the inner breast. No suspicious solid or cystic mass is identified. IMPRESSION: No mammographic or sonographic evidence of malignancy at the site of palpable concern in the RIGHT breast. Any further workup of the patient's symptoms should be based on the clinical assessment. Recommend routine annual screening mammogram, due August 2022. RECOMMENDATION: Annual screening mammogram, due August 2022 I have discussed the findings and recommendations with the patient. If applicable, a reminder letter will be sent to the patient regarding the next appointment. BI-RADS CATEGORY  1: Negative. Electronically Signed   By: Valentino Saxon MD   On: 03/15/2021 11:09  MM DIAG BREAST TOMO UNI RIGHT  Result Date: 03/15/2021 CLINICAL DATA:  Palpable RIGHT breast area, clinically appreciated EXAM: DIGITAL DIAGNOSTIC UNILATERAL RIGHT MAMMOGRAM WITH TOMOSYNTHESIS AND CAD; ULTRASOUND RIGHT BREAST LIMITED TECHNIQUE: Right digital diagnostic mammography and breast tomosynthesis was performed. The images were evaluated with computer-aided detection.; Targeted ultrasound examination of the right breast was performed COMPARISON:  Previous exam(s). ACR Breast Density Category b: There are scattered areas of fibroglandular density. FINDINGS: Spot compression tomosynthesis views were obtained over the palpable area of concern in the RIGHT breast. No suspicious mammographic finding is identified in this area. No suspicious mass, microcalcification, or other finding is identified in the RIGHT breast. On physical exam,  no suspicious mass is appreciated. Targeted RIGHT breast ultrasound was performed in the palpable area  of concern at the inner breast. No suspicious solid or cystic mass is identified. IMPRESSION: No mammographic or sonographic evidence of malignancy at the site of palpable concern in the RIGHT breast. Any further workup of the patient's symptoms should be based on the clinical assessment. Recommend routine annual screening mammogram, due August 2022. RECOMMENDATION: Annual screening mammogram, due August 2022 I have discussed the findings and recommendations with the patient. If applicable, a reminder letter will be sent to the patient regarding the next appointment. BI-RADS CATEGORY  1: Negative. Electronically Signed   By: Valentino Saxon MD   On: 03/15/2021 11:09      Assessment & Plan:   Problem List Items Addressed This Visit     Anemia    Follow cbc.  History of gastric surgery.        Depression, recurrent ([place])    On wellbutrin 369m.  Has been seeing a therapist.  Discussed.  Does not feel needs any further intervention at this time.  Follow.        Essential hypertension, benign    On lisinopril 144mq day.  Has lost weight.  Occasional light headedness as outlined.  Decrease lisinopril to 14m23m day.  Follow pressures.  Send in readings.  Follow metabolic panel.       Health care maintenance    Physical today 10/26/21.  F/u mammogram 03/15/2021 - [facility] I.  Was due f/u in 05/2021.  Schedule.  PAP 06/2020 benign changes/repair- negative with negative HPV.  Colonoscopy 12/2020 - normal.  No polyps.  Recommended f/u in 10 years.        Hypercholesterolemia    On crestor. Low cholesterol diet and exercise.  Follow lipid panel and liver function tests.        Relevant Orders   Basic metabolic panel (Completed)   Lipid panel (Completed)   Hepatic function panel (Completed)   Hyperglycemia    Has lost weight.  Follow met b and a1c.        Relevant Orders   Hemoglobin A1c (Completed)   Hypothyroidism    Has lost weight.  On thyroid replacement.  Follow tsh.       Relevant  Orders   TSH (Completed)   Status post laparoscopic sleeve gastrectomy    Has been on wegovy.  Tolerating.  Continuing to lose weight.  Follow.        Other Visit Diagnoses     Routine general medical examination at a health care facility    -  Primary        ChaEinar PheasantD

## 2021-10-26 NOTE — Patient Instructions (Signed)
Decrease lisinopril to 5mg  q day

## 2021-10-27 LAB — BASIC METABOLIC PANEL WITH GFR
BUN/Creatinine Ratio: 5 — ABNORMAL LOW (ref 9–23)
BUN: 5 mg/dL — ABNORMAL LOW (ref 6–24)
CO2: 24 mmol/L (ref 20–29)
Calcium: 9.6 mg/dL (ref 8.7–10.2)
Chloride: 104 mmol/L (ref 96–106)
Creatinine, Ser: 0.98 mg/dL (ref 0.57–1.00)
Glucose: 79 mg/dL (ref 70–99)
Potassium: 4.3 mmol/L (ref 3.5–5.2)
Sodium: 140 mmol/L (ref 134–144)
eGFR: 71 mL/min/1.73

## 2021-10-27 LAB — HEPATIC FUNCTION PANEL
ALT: 25 IU/L (ref 0–32)
AST: 19 IU/L (ref 0–40)
Albumin: 4.6 g/dL (ref 3.8–4.8)
Alkaline Phosphatase: 40 IU/L — ABNORMAL LOW (ref 44–121)
Bilirubin Total: 0.4 mg/dL (ref 0.0–1.2)
Bilirubin, Direct: 0.14 mg/dL (ref 0.00–0.40)
Total Protein: 7 g/dL (ref 6.0–8.5)

## 2021-10-27 LAB — TSH: TSH: 2.54 u[IU]/mL (ref 0.450–4.500)

## 2021-10-27 LAB — LIPID PANEL
Chol/HDL Ratio: 2.2 ratio (ref 0.0–4.4)
Cholesterol, Total: 174 mg/dL (ref 100–199)
HDL: 79 mg/dL (ref >39)
LDL Chol Calc (NIH): 84 mg/dL (ref 0–99)
Triglycerides: 58 mg/dL (ref 0–149)
VLDL Cholesterol Cal: 11 mg/dL (ref 5–40)

## 2021-10-27 LAB — HEMOGLOBIN A1C
Est. average glucose Bld gHb Est-mCnc: 120 mg/dL
Hgb A1c MFr Bld: 5.8 % — ABNORMAL HIGH (ref 4.8–5.6)

## 2021-10-29 ENCOUNTER — Encounter: Payer: Self-pay | Admitting: Internal Medicine

## 2021-10-29 NOTE — Assessment & Plan Note (Signed)
Has lost weight.  Follow met b and a1c.   

## 2021-10-29 NOTE — Assessment & Plan Note (Signed)
Has lost weight.  On thyroid replacement.  Follow tsh.  ?

## 2021-10-29 NOTE — Assessment & Plan Note (Signed)
Follow cbc.  History of gastric surgery.  

## 2021-10-29 NOTE — Assessment & Plan Note (Signed)
On wellbutrin 300mg.  Has been seeing a therapist.  Discussed.  Does not feel needs any further intervention at this time.  Follow.   

## 2021-10-29 NOTE — Assessment & Plan Note (Addendum)
Physical today 10/26/21.  F/u mammogram 03/15/2021 - [facility] I.  Was due f/u in 05/2021.  Schedule.  PAP 06/2020 benign changes/repair- negative with negative HPV.  Colonoscopy 12/2020 - normal.  No polyps.  Recommended f/u in 10 years.

## 2021-10-29 NOTE — Assessment & Plan Note (Signed)
On crestor.  Low cholesterol diet and exercise.  Follow lipid panel and liver function tests.   

## 2021-10-29 NOTE — Assessment & Plan Note (Signed)
On lisinopril 10mg  q day.  Has lost weight.  Occasional light headedness as outlined.  Decrease lisinopril to 5mg  q day.  Follow pressures.  Send in readings.  Follow metabolic panel.

## 2021-10-29 NOTE — Assessment & Plan Note (Signed)
Has been on wegovy.  Tolerating.  Continuing to lose weight.  Follow.

## 2021-10-31 ENCOUNTER — Encounter: Payer: Self-pay | Admitting: Internal Medicine

## 2021-10-31 DIAGNOSIS — E611 Iron deficiency: Secondary | ICD-10-CM

## 2021-11-08 NOTE — Telephone Encounter (Signed)
Labs ordered.  Please schedule non fasting lab appt.

## 2021-11-08 NOTE — Telephone Encounter (Signed)
Pt requesting to have hgb and iron drawn with her last labs but was too late. I am going to place orders for her to have redrawn but wanted to confirm with you which iron to order.

## 2021-11-08 NOTE — Telephone Encounter (Signed)
LM to call back. Please schedule nonfasting lab any time.

## 2021-11-17 ENCOUNTER — Other Ambulatory Visit: Payer: Self-pay | Admitting: Internal Medicine

## 2021-11-25 ENCOUNTER — Other Ambulatory Visit: Payer: Self-pay | Admitting: Internal Medicine

## 2021-12-23 ENCOUNTER — Other Ambulatory Visit: Payer: Self-pay | Admitting: Internal Medicine

## 2022-01-16 ENCOUNTER — Telehealth: Payer: Self-pay

## 2022-01-16 NOTE — Telephone Encounter (Signed)
[name] Nielsen (Key: [id number]) ?Rx #: [id number] ?Wegovy 2.4MG /0.75ML auto-injectors ?  ?Form ?[facility] Electronic Prior Authorization Form [phone] NCPDP) ? ?PA initiated ?

## 2022-01-16 NOTE — Telephone Encounter (Signed)
Your prior authorization for Reginal Lutes has been approved! ? ?Message from plan: Request Reference Number: [id number]. WEGOVY INJ 2.4MG  is approved through 07/18/2022. Your patient may now fill this prescription and it will be covered. ? ? ?

## 2022-01-27 ENCOUNTER — Other Ambulatory Visit: Payer: Self-pay | Admitting: Internal Medicine

## 2022-01-30 ENCOUNTER — Ambulatory Visit: Payer: Managed Care, Other (non HMO) | Admitting: Internal Medicine

## 2022-01-30 ENCOUNTER — Encounter: Payer: Self-pay | Admitting: Internal Medicine

## 2022-01-30 DIAGNOSIS — E039 Hypothyroidism, unspecified: Secondary | ICD-10-CM | POA: Diagnosis not present

## 2022-01-30 DIAGNOSIS — D649 Anemia, unspecified: Secondary | ICD-10-CM | POA: Diagnosis not present

## 2022-01-30 DIAGNOSIS — E7800 Pure hypercholesterolemia, unspecified: Secondary | ICD-10-CM | POA: Diagnosis not present

## 2022-01-30 DIAGNOSIS — Z9884 Bariatric surgery status: Secondary | ICD-10-CM

## 2022-01-30 DIAGNOSIS — R739 Hyperglycemia, unspecified: Secondary | ICD-10-CM | POA: Diagnosis not present

## 2022-01-30 DIAGNOSIS — F339 Major depressive disorder, recurrent, unspecified: Secondary | ICD-10-CM

## 2022-01-30 DIAGNOSIS — I10 Essential (primary) hypertension: Secondary | ICD-10-CM | POA: Diagnosis not present

## 2022-01-30 DIAGNOSIS — M79641 Pain in right hand: Secondary | ICD-10-CM

## 2022-01-30 LAB — CBC WITH DIFFERENTIAL/PLATELET
Basophils Absolute: 0.1 10*3/uL (ref 0.0–0.1)
Basophils Relative: 1.1 % (ref 0.0–3.0)
Eosinophils Absolute: 0 10*3/uL (ref 0.0–0.7)
Eosinophils Relative: 1 % (ref 0.0–5.0)
HCT: 31.6 % — ABNORMAL LOW (ref 36.0–46.0)
Hemoglobin: 10 g/dL — ABNORMAL LOW (ref 12.0–15.0)
Lymphocytes Relative: 25.7 % (ref 12.0–46.0)
Lymphs Abs: 1.2 10*3/uL (ref 0.7–4.0)
MCHC: 31.7 g/dL (ref 30.0–36.0)
MCV: 68.2 fl — ABNORMAL LOW (ref 78.0–100.0)
Monocytes Absolute: 0.4 10*3/uL (ref 0.1–1.0)
Monocytes Relative: 8.4 % (ref 3.0–12.0)
Neutro Abs: 3.1 10*3/uL (ref 1.4–7.7)
Neutrophils Relative %: 63.8 % (ref 43.0–77.0)
Platelets: 251 10*3/uL (ref 150.0–400.0)
RBC: 4.64 Mil/uL (ref 3.87–5.11)
RDW: 15.3 % (ref 11.5–15.5)
WBC: 4.8 10*3/uL (ref 4.0–10.5)

## 2022-01-30 LAB — LIPID PANEL
Cholesterol: 159 mg/dL (ref 0–200)
HDL: 71.9 mg/dL (ref >39.00)
LDL Cholesterol: 72 mg/dL (ref 0–99)
NonHDL: 86.98
Total CHOL/HDL Ratio: 2
Triglycerides: 76 mg/dL (ref 0.0–149.0)
VLDL: 15.2 mg/dL (ref 0.0–40.0)

## 2022-01-30 LAB — BASIC METABOLIC PANEL
BUN: 9 mg/dL (ref 6–23)
CO2: 27 mEq/L (ref 19–32)
Calcium: 9 mg/dL (ref 8.4–10.5)
Chloride: 106 mEq/L (ref 96–112)
Creatinine, Ser: 0.93 mg/dL (ref 0.40–1.20)
GFR: 72.81 mL/min (ref >60.00)
Glucose, Bld: 69 mg/dL — ABNORMAL LOW (ref 70–99)
Potassium: 4 mEq/L (ref 3.5–5.1)
Sodium: 139 mEq/L (ref 135–145)

## 2022-01-30 LAB — HEPATIC FUNCTION PANEL
ALT: 33 U/L (ref 0–35)
AST: 24 U/L (ref 0–37)
Albumin: 4.4 g/dL (ref 3.5–5.2)
Alkaline Phosphatase: 36 U/L — ABNORMAL LOW (ref 39–117)
Bilirubin, Direct: 0.1 mg/dL (ref 0.0–0.3)
Total Bilirubin: 0.5 mg/dL (ref 0.2–1.2)
Total Protein: 7.1 g/dL (ref 6.0–8.3)

## 2022-01-30 LAB — FERRITIN: Ferritin: 3 ng/mL — ABNORMAL LOW (ref 10.0–291.0)

## 2022-01-30 LAB — TSH: TSH: 2.68 u[IU]/mL (ref 0.35–5.50)

## 2022-01-30 LAB — HEMOGLOBIN A1C: Hgb A1c MFr Bld: 5.7 % (ref 4.6–6.5)

## 2022-01-30 NOTE — Progress Notes (Signed)
Patient ID: [name] Nielsen, female   DOB: November 24, 1975, 46 y.o.   MRN: [id number] ? ? ?Subjective:  ? ? Patient ID: [name] Nielsen, female    DOB: 29-Oct-1975, 46 y.o.   MRN: [id number] ? ?This visit occurred during the SARS-CoV-2 public health emergency.  Safety protocols were in place, including screening questions prior to the visit, additional usage of staff PPE, and extensive cleaning of exam room while observing appropriate contact time as indicated for disinfecting solutions.  ? ?Patient here for a scheduled follow up.  ? ?Chief Complaint  ?Patient presents with  ? Follow-up  ?  Follow up for HTN, weight loss and depression.  ? Hypertension  ? Depression  ? Weight Loss  ? .  ? ?HPI ?Overall she feels she is doing well.  Stays active.  Is continuing to watch her diet and has kept her weight off.  No chest pain or sob reported.  Is concerned regarding her hgb/iron levels.  Is eating ice. Cold.  No abdominal pain or bowels change reported.  Is having persistent issues with her right hand.  Discussed further evaluation - ortho.  Two periods in April.  Will keep menstrual diary.   ? ? ?Past Medical History:  ?Diagnosis Date  ? Anemia   ? hx of   ? Anxiety   ? COVID-19 virus infection 11/2019  ? Depression   ? Frequent headaches   ? hx of  ? Heart murmur   ? Hx: UTI (urinary tract infection)   ? child, s/p urinary procecure  ? Hypercholesterolemia   ? Hypertension   ? Hypothyroidism   ? ?Past Surgical History:  ?Procedure Laterality Date  ? CHOLECYSTECTOMY  2012  ? COLONOSCOPY    ? > 10 yrs ago- normal   ? LAPAROSCOPIC GASTRIC SLEEVE RESECTION N/A 07/05/2014  ? Procedure: LAPAROSCOPIC GASTRIC SLEEVE RESECTION with upper endoscopy;  Surgeon: Wenda Low, MD;  Location: [facility];  Service: General;  Laterality: N/A;  ? TUBAL LIGATION  1999  ? WISDOM TOOTH EXTRACTION    ? age 28   ? ?Family History  ?Problem Relation Age of Onset  ? Cancer Mother   ?     brain  ? Diabetes Father   ? Hypertension Father   ? Kidney disease  Father   ? Stroke Father   ? Cancer Father   ?     prostate  ? Prostate cancer Father   ? Breast cancer Neg Hx   ? Colon cancer Neg Hx   ? Colon polyps Neg Hx   ? Esophageal cancer Neg Hx   ? Rectal cancer Neg Hx   ? Stomach cancer Neg Hx   ? ?Social History  ? ?Socioeconomic History  ? Marital status: Single  ?  Spouse name: Not on file  ? Number of children: Not on file  ? Years of education: Not on file  ? Highest education level: Not on file  ?Occupational History  ? Not on file  ?Tobacco Use  ? Smoking status: Former  ? Smokeless tobacco: Never  ?Substance and Sexual Activity  ? Alcohol use: No  ?  Alcohol/week: 0.0 standard drinks  ? Drug use: No  ? Sexual activity: Not on file  ?Other Topics Concern  ? Not on file  ?Social History Narrative  ? Not on file  ? ?Social Determinants of Health  ? ?Financial Resource Strain: Not on file  ?Food Insecurity: Not on file  ?Transportation Needs: Not on file  ?Physical Activity:  Not on file  ?Stress: Not on file  ?Social Connections: Not on file  ? ? ? ?Review of Systems  ?Constitutional:  Negative for appetite change and unexpected weight change.  ?HENT:  Negative for congestion and sinus pressure.   ?Respiratory:  Negative for cough, chest tightness and shortness of breath.   ?Cardiovascular:  Negative for chest pain, palpitations and leg swelling.  ?Gastrointestinal:  Negative for abdominal pain, diarrhea, nausea and vomiting.  ?Genitourinary:  Negative for difficulty urinating and dysuria.  ?Musculoskeletal:  Negative for joint swelling and myalgias.  ?     Right hand discomfort as outlined.  ?Skin:  Negative for color change and rash.  ?Neurological:  Negative for dizziness, light-headedness and headaches.  ?Psychiatric/Behavioral:  Negative for agitation and dysphoric mood.   ? ?   ?Objective:  ?  ? ?BP 108/60 (BP Location: Left Arm, Patient Position: Sitting, Cuff Size: Small)   Pulse 73   Temp 97.6 ?F (36.4 ?C) (Temporal)   Resp 13   Ht 5\' 7"  (1.702 m)   Wt  165 lb 3.2 oz (74.9 kg)   SpO2 99%   BMI 25.87 kg/m?  ?Wt Readings from Last 3 Encounters:  ?01/30/22 165 lb 3.2 oz (74.9 kg)  ?10/26/21 165 lb 12.8 oz (75.2 kg)  ?08/20/21 170 lb (77.1 kg)  ? ? ?Physical Exam ?Vitals reviewed.  ?Constitutional:   ?   General: She is not in acute distress. ?   Appearance: Normal appearance.  ?HENT:  ?   Head: Normocephalic and atraumatic.  ?   Right Ear: External ear normal.  ?   Left Ear: External ear normal.  ?Eyes:  ?   General: No scleral icterus.    ?   Right eye: No discharge.     ?   Left eye: No discharge.  ?   Conjunctiva/sclera: Conjunctivae normal.  ?Neck:  ?   Thyroid: No thyromegaly.  ?Cardiovascular:  ?   Rate and Rhythm: Normal rate and regular rhythm.  ?Pulmonary:  ?   Effort: No respiratory distress.  ?   Breath sounds: Normal breath sounds. No wheezing.  ?Abdominal:  ?   General: Bowel sounds are normal.  ?   Palpations: Abdomen is soft.  ?   Tenderness: There is no abdominal tenderness.  ?Musculoskeletal:     ?   General: No swelling or tenderness.  ?   Cervical back: Neck supple. No tenderness.  ?Lymphadenopathy:  ?   Cervical: No cervical adenopathy.  ?Skin: ?   Findings: No erythema or rash.  ?Neurological:  ?   Mental Status: She is alert.  ?Psychiatric:     ?   Mood and Affect: Mood normal.     ?   Behavior: Behavior normal.  ? ? ? ?Outpatient Encounter Medications as of 01/30/2022  ?Medication Sig  ? acetaminophen (TYLENOL) 500 MG tablet Take 1,000 mg by mouth every 6 (six) hours as needed for mild pain or moderate pain.  ? ALPRAZolam (XANAX) 0.25 MG tablet Take 1 tablet (0.25 mg total) by mouth daily as needed for anxiety.  ? buPROPion (WELLBUTRIN XL) 300 MG 24 hr tablet Take 1 tablet by mouth once daily  ? cholecalciferol (VITAMIN D3) 25 MCG (1000 UNIT) tablet Take 2,000 Units by mouth daily.  ? Cyanocobalamin (B-12 PO) Take by mouth.  ? levothyroxine (SYNTHROID) 88 MCG tablet TAKE 1 TABLET BY MOUTH ONCE DAILY BEFORE BREAKFAST  ? rosuvastatin (CRESTOR) 10  MG tablet Take 1 tablet by mouth once  daily  ? Semaglutide-Weight Management 2.4 MG/0.75ML SOAJ Inject 2.4 mg into the skin once a week.  ? [DISCONTINUED] lisinopril (ZESTRIL) 10 MG tablet Take 1 tablet by mouth once daily  ? ?No facility-administered encounter medications on file as of 01/30/2022.  ?  ? ?Lab Results  ?Component Value Date  ? WBC 4.8 01/30/2022  ? HGB 10.0 (L) 01/30/2022  ? HCT 31.6 (L) 01/30/2022  ? PLT 251.0 01/30/2022  ? GLUCOSE 69 (L) 01/30/2022  ? CHOL 159 01/30/2022  ? TRIG 76.0 01/30/2022  ? HDL 71.90 01/30/2022  ? LDLDIRECT 178.3 11/11/2013  ? LDLCALC 72 01/30/2022  ? ALT 33 01/30/2022  ? AST 24 01/30/2022  ? NA 139 01/30/2022  ? K 4.0 01/30/2022  ? CL 106 01/30/2022  ? CREATININE 0.93 01/30/2022  ? BUN 9 01/30/2022  ? CO2 27 01/30/2022  ? TSH 2.68 01/30/2022  ? HGBA1C 5.7 01/30/2022  ? ? ?US BREAST LTD UNI RIGHT INC AXILLA ? ?Result Date: 03/15/2021 ?CLINICAL DATA:  Palpable RIGHT breast area, clinically appreciated EXAM: DIGITAL DIAGNOSTIC UNILATERAL RIGHT MAMMOGRAM WITH TOMOSYNTHESIS AND CAD; ULTRASOUND RIGHT BREAST LIMITED TECHNIQUE: Right digital diagnostic mammography and breast tomosynthesis was performed. The images were evaluated with computer-aided detection.; Targeted ultrasound examination of the right breast was performed COMPARISON:  Previous exam(s). ACR Breast Density Category b: There are scattered areas of fibroglandular density. FINDINGS: Spot compression tomosynthesis views were obtained over the palpable area of concern in the RIGHT breast. No suspicious mammographic finding is identified in this area. No suspicious mass, microcalcification, or other finding is identified in the RIGHT breast. On physical exam, no suspicious mass is appreciated. Targeted RIGHT breast ultrasound was performed in the palpable area of concern at the inner breast. No suspicious solid or cystic mass is identified. IMPRESSION: No mammographic or sonographic evidence of malignancy at the site of  palpable concern in the RIGHT breast. Any further workup of the patient's symptoms should be based on the clinical assessment. Recommend routine annual screening mammogram, due August 2022. RECOMMENDATION: Annual

## 2022-01-31 ENCOUNTER — Other Ambulatory Visit: Payer: Self-pay

## 2022-01-31 DIAGNOSIS — E611 Iron deficiency: Secondary | ICD-10-CM

## 2022-01-31 DIAGNOSIS — D649 Anemia, unspecified: Secondary | ICD-10-CM

## 2022-02-10 ENCOUNTER — Encounter: Payer: Self-pay | Admitting: Internal Medicine

## 2022-02-10 DIAGNOSIS — M79641 Pain in right hand: Secondary | ICD-10-CM | POA: Insufficient documentation

## 2022-02-10 NOTE — Assessment & Plan Note (Signed)
Has lost weight.  Follow met b and a1c.   

## 2022-02-10 NOTE — Assessment & Plan Note (Signed)
Persistent.  Request referral to ortho.  ?

## 2022-02-10 NOTE — Assessment & Plan Note (Signed)
Currently on lisinopril 5mg  q day now.  Given pressures doing well, will stop lisinopril and follow pressures.  Any change or problems with increasing blood pressure - notify me.   ?

## 2022-02-10 NOTE — Assessment & Plan Note (Signed)
History of gastric surgery.  Eating ice.  Periods as outlined.  Check cbc and iron studies.   ?

## 2022-02-10 NOTE — Assessment & Plan Note (Signed)
On crestor.  Low cholesterol diet and exercise.  Follow lipid panel and liver function tests.   

## 2022-02-10 NOTE — Assessment & Plan Note (Signed)
Has been on wegovy.  Tolerating.  Has lost weight.  Check cbc and iron studies.   ?

## 2022-02-10 NOTE — Assessment & Plan Note (Signed)
Has lost weight.  On thyroid replacement.  Follow tsh.  ?

## 2022-02-10 NOTE — Assessment & Plan Note (Signed)
On wellbutrin 300mg.  Has been seeing a therapist.  Discussed.  Does not feel needs any further intervention at this time.  Follow.   

## 2022-02-18 ENCOUNTER — Other Ambulatory Visit (INDEPENDENT_AMBULATORY_CARE_PROVIDER_SITE_OTHER): Payer: Managed Care, Other (non HMO)

## 2022-02-18 DIAGNOSIS — R319 Hematuria, unspecified: Secondary | ICD-10-CM | POA: Diagnosis not present

## 2022-02-18 DIAGNOSIS — E611 Iron deficiency: Secondary | ICD-10-CM

## 2022-02-18 DIAGNOSIS — D649 Anemia, unspecified: Secondary | ICD-10-CM

## 2022-02-18 NOTE — Addendum Note (Signed)
Addended by: Leeanne Rio on: 02/18/2022 11:32 AM ? ? Modules accepted: Orders ? ?

## 2022-02-18 NOTE — Addendum Note (Signed)
Addended by: Marcee Jacobs S on: 02/18/2022 11:32 AM ? ? Modules accepted: Orders ? ?

## 2022-02-19 ENCOUNTER — Other Ambulatory Visit: Payer: Self-pay | Admitting: Internal Medicine

## 2022-02-19 ENCOUNTER — Other Ambulatory Visit: Payer: Self-pay | Admitting: Family

## 2022-02-19 LAB — CBC WITH DIFFERENTIAL/PLATELET
Basophils Absolute: 0.1 10*3/uL (ref 0.0–0.2)
Basos: 1 %
EOS (ABSOLUTE): 0.1 10*3/uL (ref 0.0–0.4)
Eos: 1 %
Hematocrit: 36.3 % (ref 34.0–46.6)
Hemoglobin: 11.1 g/dL (ref 11.1–15.9)
Immature Grans (Abs): 0 10*3/uL (ref 0.0–0.1)
Immature Granulocytes: 0 %
Lymphocytes Absolute: 1.8 10*3/uL (ref 0.7–3.1)
Lymphs: 22 %
MCH: 22.2 pg — ABNORMAL LOW (ref 26.6–33.0)
MCHC: 30.6 g/dL — ABNORMAL LOW (ref 31.5–35.7)
MCV: 73 fL — ABNORMAL LOW (ref 79–97)
Monocytes Absolute: 0.7 10*3/uL (ref 0.1–0.9)
Monocytes: 8 %
Neutrophils Absolute: 5.6 10*3/uL (ref 1.4–7.0)
Neutrophils: 68 %
Platelets: 334 10*3/uL (ref 150–450)
RBC: 4.99 x10E6/uL (ref 3.77–5.28)
RDW: 17 % — ABNORMAL HIGH (ref 11.7–15.4)
WBC: 8.2 10*3/uL (ref 3.4–10.8)

## 2022-02-19 LAB — URINALYSIS, ROUTINE W REFLEX MICROSCOPIC
Bilirubin, UA: NEGATIVE
Glucose, UA: NEGATIVE
Ketones, UA: NEGATIVE
Leukocytes,UA: NEGATIVE
Nitrite, UA: NEGATIVE
Protein,UA: NEGATIVE
RBC, UA: NEGATIVE
Specific Gravity, UA: 1.005 (ref 1.005–1.030)
Urobilinogen, Ur: 0.2 mg/dL (ref 0.2–1.0)
pH, UA: 6.5 (ref 5.0–7.5)

## 2022-02-19 LAB — FERRITIN: Ferritin: 13 ng/mL — ABNORMAL LOW (ref 15–150)

## 2022-02-20 ENCOUNTER — Other Ambulatory Visit: Payer: Self-pay

## 2022-02-20 ENCOUNTER — Other Ambulatory Visit: Payer: Self-pay | Admitting: Internal Medicine

## 2022-02-20 DIAGNOSIS — D649 Anemia, unspecified: Secondary | ICD-10-CM

## 2022-02-20 MED ORDER — INTEGRA 62.5-62.5-40-3 MG PO CAPS
ORAL_CAPSULE | ORAL | 2 refills | Status: DC
Start: 2022-02-20 — End: 2022-05-27

## 2022-02-20 NOTE — Progress Notes (Signed)
Rx ok'd for [employer].  ?

## 2022-02-23 ENCOUNTER — Other Ambulatory Visit: Payer: Self-pay | Admitting: Internal Medicine

## 2022-03-18 ENCOUNTER — Ambulatory Visit
Admission: RE | Admit: 2022-03-18 | Discharge: 2022-03-18 | Disposition: A | Discharge status: Home / Self Care | Payer: Managed Care, Other (non HMO) | Source: Ambulatory Visit | Attending: Internal Medicine | Admitting: Internal Medicine

## 2022-03-18 DIAGNOSIS — Z1231 Encounter for screening mammogram for malignant neoplasm of breast: Secondary | ICD-10-CM | POA: Diagnosis present

## 2022-03-19 ENCOUNTER — Other Ambulatory Visit: Payer: Self-pay | Admitting: Internal Medicine

## 2022-03-28 ENCOUNTER — Other Ambulatory Visit: Payer: Self-pay | Admitting: Internal Medicine

## 2022-04-17 ENCOUNTER — Other Ambulatory Visit: Payer: Self-pay | Admitting: Internal Medicine

## 2022-04-17 ENCOUNTER — Other Ambulatory Visit (INDEPENDENT_AMBULATORY_CARE_PROVIDER_SITE_OTHER): Payer: Managed Care, Other (non HMO)

## 2022-04-17 DIAGNOSIS — D649 Anemia, unspecified: Secondary | ICD-10-CM

## 2022-04-17 LAB — CBC WITH DIFFERENTIAL/PLATELET
Basophils Absolute: 0.1 10*3/uL (ref 0.0–0.1)
Basophils Relative: 0.9 % (ref 0.0–3.0)
Eosinophils Absolute: 0 10*3/uL (ref 0.0–0.7)
Eosinophils Relative: 0.8 % (ref 0.0–5.0)
HCT: 37.1 % (ref 36.0–46.0)
Hemoglobin: 11.9 g/dL — ABNORMAL LOW (ref 12.0–15.0)
Lymphocytes Relative: 24.8 % (ref 12.0–46.0)
Lymphs Abs: 1.4 10*3/uL (ref 0.7–4.0)
MCHC: 32 g/dL (ref 30.0–36.0)
MCV: 73.4 fl — ABNORMAL LOW (ref 78.0–100.0)
Monocytes Absolute: 0.5 10*3/uL (ref 0.1–1.0)
Monocytes Relative: 9 % (ref 3.0–12.0)
Neutro Abs: 3.5 10*3/uL (ref 1.4–7.7)
Neutrophils Relative %: 64.5 % (ref 43.0–77.0)
Platelets: 233 10*3/uL (ref 150.0–400.0)
RBC: 5.06 Mil/uL (ref 3.87–5.11)
RDW: 19.7 % — ABNORMAL HIGH (ref 11.5–15.5)
WBC: 5.5 10*3/uL (ref 4.0–10.5)

## 2022-04-17 LAB — IBC + FERRITIN
Ferritin: 18.9 ng/mL (ref 10.0–291.0)
Iron: 93 ug/dL (ref 42–145)
Saturation Ratios: 25.5 % (ref 20.0–50.0)
TIBC: 365.4 ug/dL (ref 250.0–450.0)
Transferrin: 261 mg/dL (ref 212.0–360.0)

## 2022-04-20 ENCOUNTER — Other Ambulatory Visit: Payer: Self-pay | Admitting: Internal Medicine

## 2022-04-20 DIAGNOSIS — E7800 Pure hypercholesterolemia, unspecified: Secondary | ICD-10-CM

## 2022-05-22 ENCOUNTER — Encounter: Payer: Self-pay | Admitting: Internal Medicine

## 2022-05-23 ENCOUNTER — Other Ambulatory Visit: Payer: Self-pay | Admitting: Internal Medicine

## 2022-05-23 ENCOUNTER — Other Ambulatory Visit: Payer: Self-pay

## 2022-05-23 DIAGNOSIS — R300 Dysuria: Secondary | ICD-10-CM

## 2022-05-23 NOTE — Telephone Encounter (Signed)
I called patient & she is leaving urine tomorrow VV with Claris Che on Monday.

## 2022-05-24 ENCOUNTER — Other Ambulatory Visit (INDEPENDENT_AMBULATORY_CARE_PROVIDER_SITE_OTHER): Payer: Managed Care, Other (non HMO)

## 2022-05-24 DIAGNOSIS — R300 Dysuria: Secondary | ICD-10-CM | POA: Diagnosis not present

## 2022-05-24 LAB — URINALYSIS, ROUTINE W REFLEX MICROSCOPIC
Bilirubin Urine: NEGATIVE
Hgb urine dipstick: NEGATIVE
Ketones, ur: NEGATIVE
Nitrite: NEGATIVE
Specific Gravity, Urine: 1.025 (ref 1.000–1.030)
Total Protein, Urine: NEGATIVE
Urine Glucose: NEGATIVE
Urobilinogen, UA: 0.2 (ref 0.0–1.0)
pH: 6 (ref 5.0–8.0)

## 2022-05-26 ENCOUNTER — Other Ambulatory Visit: Payer: Self-pay | Admitting: Internal Medicine

## 2022-05-26 ENCOUNTER — Other Ambulatory Visit: Payer: Self-pay | Admitting: Family

## 2022-05-26 DIAGNOSIS — E7800 Pure hypercholesterolemia, unspecified: Secondary | ICD-10-CM

## 2022-05-27 ENCOUNTER — Telehealth (INDEPENDENT_AMBULATORY_CARE_PROVIDER_SITE_OTHER): Payer: Managed Care, Other (non HMO) | Admitting: Family

## 2022-05-27 ENCOUNTER — Encounter: Payer: Self-pay | Admitting: Family

## 2022-05-27 VITALS — Ht 67.0 in | Wt 165.0 lb

## 2022-05-27 DIAGNOSIS — R8290 Unspecified abnormal findings in urine: Secondary | ICD-10-CM | POA: Diagnosis not present

## 2022-05-27 LAB — URINE CULTURE
MICRO NUMBER:: 13800093
SPECIMEN QUALITY:: ADEQUATE

## 2022-05-27 MED ORDER — NITROFURANTOIN MONOHYD MACRO 100 MG PO CAPS: 100.0000 mg | ORAL_CAPSULE | Freq: Two times a day (BID) | ORAL | 0 refills | Status: DC

## 2022-05-27 NOTE — Patient Instructions (Signed)
I have sent in macrobid ( antibiotic) due to strain of e coli on urine culture.   Ensure to take probiotics while on antibiotics and also for 2 weeks after completion. This can either be by eating yogurt daily or taking a probiotic supplement over the counter such as Culturelle.It is important to re-colonize the gut with good bacteria and also to prevent any diarrheal infections associated with antibiotic use.   Please continue drink plenty water and let me know how you are doing, and most certainly if symptoms do not completely resolve

## 2022-05-27 NOTE — Progress Notes (Signed)
Virtual Visit via Video Note  I connected with@  on 05/27/22 at 12:00 PM EDT by a video enabled telemedicine application and verified that I am speaking with the correct person using two identifiers.  Location patient: home Location provider:work  Persons participating in the virtual visit: patient, provider  I discussed the limitations of evaluation and management by telemedicine and the availability of in person appointments. The patient expressed understanding and agreed to proceed.   HPI: Acute visit for cloudy urine, urine odor 6 days, symptoms improved.  Urine culture is pending.  Urinalysis obtained 3 days ago negative for glucose ketones, blood.  White blood cells bacteria are present No abdominal pain,  fever, flank pain.  She is drinking a lot of water.   History of depression, hypothyroidism, hypertension Documented UTI 10 months ago E. Coli   ROS: See pertinent positives and negatives per HPI.    EXAM:  VITALS per patient if applicable: Ht 5\' 7"  (1.702 m)   Wt 165 lb (74.8 kg)   BMI 25.84 kg/m  BP Readings from Last 3 Encounters:  01/30/22 108/60  10/26/21 118/70  07/06/21 106/70   Wt Readings from Last 3 Encounters:  05/27/22 165 lb (74.8 kg)  01/30/22 165 lb 3.2 oz (74.9 kg)  10/26/21 165 lb 12.8 oz (75.2 kg)    GENERAL: alert, oriented, appears well and in no acute distress  HEENT: atraumatic, conjunttiva clear, no obvious abnormalities on inspection of external nose and ears  NECK: normal movements of the head and neck  LUNGS: on inspection no signs of respiratory distress, breathing rate appears normal, no obvious gross SOB, gasping or wheezing  CV: no obvious cyanosis  MS: moves all visible extremities without noticeable abnormality  PSYCH/NEURO: pleasant and cooperative, no obvious depression or anxiety, speech and thought processing grossly intact  ASSESSMENT AND PLAN:  Discussed the following assessment and plan:  Problem List Items  Addressed This Visit       Other   Abnormal urine odor - Primary    Reviewed urinalysis with patient. After visit, Urine culture resulted > 100,000 CFU, e coli. Will start macrobid based on sensitivities.       Relevant Medications   nitrofurantoin, macrocrystal-monohydrate, (MACROBID) 100 MG capsule    -we discussed possible serious and likely etiologies, options for evaluation and workup, limitations of telemedicine visit vs in person visit, treatment, treatment risks and precautions. Pt prefers to treat via telemedicine empirically rather then risking or undertaking an in person visit at this moment.  .   I discussed the assessment and treatment plan with the patient. The patient was provided an opportunity to ask questions and all were answered. The patient agreed with the plan and demonstrated an understanding of the instructions.   The patient was advised to call back or seek an in-person evaluation if the symptoms worsen or if the condition fails to improve as anticipated.   10/28/21, FNP

## 2022-05-27 NOTE — Assessment & Plan Note (Addendum)
Reviewed urinalysis with patient. After visit, Urine culture resulted > 100,000 CFU, e coli. Will start macrobid based on sensitivities.

## 2022-05-28 ENCOUNTER — Telehealth: Payer: Self-pay

## 2022-05-28 ENCOUNTER — Telehealth: Payer: Self-pay | Admitting: Family

## 2022-05-28 NOTE — Telephone Encounter (Signed)
Reviewing chart for some reason I cannot see my result note.  I sent a result note to call patient and inform her that she does have a UTI.  Have sent in Macrobid and antibiotic.  She will need to start probiotics.  Please ensure pt is aware

## 2022-05-28 NOTE — Telephone Encounter (Signed)
LVM to call back to go over results 

## 2022-06-03 ENCOUNTER — Encounter: Payer: Self-pay | Admitting: Internal Medicine

## 2022-06-03 ENCOUNTER — Ambulatory Visit: Payer: Managed Care, Other (non HMO) | Admitting: Internal Medicine

## 2022-06-03 VITALS — BP 122/82 | HR 62 | Temp 98.2°F | Ht 67.0 in | Wt 163.0 lb

## 2022-06-03 DIAGNOSIS — E039 Hypothyroidism, unspecified: Secondary | ICD-10-CM | POA: Diagnosis not present

## 2022-06-03 DIAGNOSIS — M79641 Pain in right hand: Secondary | ICD-10-CM

## 2022-06-03 DIAGNOSIS — I10 Essential (primary) hypertension: Secondary | ICD-10-CM

## 2022-06-03 DIAGNOSIS — N926 Irregular menstruation, unspecified: Secondary | ICD-10-CM

## 2022-06-03 DIAGNOSIS — F32A Depression, unspecified: Secondary | ICD-10-CM

## 2022-06-03 DIAGNOSIS — D649 Anemia, unspecified: Secondary | ICD-10-CM

## 2022-06-03 DIAGNOSIS — E7800 Pure hypercholesterolemia, unspecified: Secondary | ICD-10-CM | POA: Diagnosis not present

## 2022-06-03 DIAGNOSIS — R739 Hyperglycemia, unspecified: Secondary | ICD-10-CM

## 2022-06-03 DIAGNOSIS — Z9884 Bariatric surgery status: Secondary | ICD-10-CM

## 2022-06-03 DIAGNOSIS — F339 Major depressive disorder, recurrent, unspecified: Secondary | ICD-10-CM

## 2022-06-03 MED ORDER — NAPROXEN 500 MG PO TABS: 500.0000 mg | ORAL_TABLET | Freq: Two times a day (BID) | ORAL | 0 refills | Status: DC

## 2022-06-03 MED ORDER — MAGNESIUM OXIDE -MG SUPPLEMENT 400 (240 MG) MG PO TABS: 400.0000 mg | ORAL_TABLET | Freq: Every day | ORAL | 2 refills | Status: DC

## 2022-06-03 NOTE — Progress Notes (Signed)
Patient ID: [name] Nielsen, female   DOB: 04-13-1976, 46 y.o.   MRN: [id number]   Subjective:    Patient ID: [name] Nielsen, female    DOB: 1976/03/24, 46 y.o.   MRN: [id number]   Patient here for  Chief Complaint  Patient presents with   Follow-up   .   HPI Here to follow up regarding her blood pressure and increased stress/depression.  Last visit, stopped lisinopril.  Blood pressure doing well.  No chest pain.  Breathing stable.  Has noticed that over the past year - periods irregular - varying periods.  Increased PMS symptoms.  Stomach swelling and increased cramps.  Some increased anxiety.  On wellbutrin.  Working well for her depression.  Saw Dr Peggye Ley for right hand pain.    Past Medical History:  Diagnosis Date   Anemia    hx of    Anxiety    COVID-19 virus infection 11/2019   Depression    Frequent headaches    hx of   Heart murmur    Hx: UTI (urinary tract infection)    child, s/p urinary procecure   Hypercholesterolemia    Hypertension    Hypothyroidism    Past Surgical History:  Procedure Laterality Date   CHOLECYSTECTOMY  2012   COLONOSCOPY     > 10 yrs ago- normal    LAPAROSCOPIC GASTRIC SLEEVE RESECTION N/A 07/05/2014   Procedure: LAPAROSCOPIC GASTRIC SLEEVE RESECTION with upper endoscopy;  Surgeon: Kaylyn Lim, MD;  Location: [facility];  Service: General;  Laterality: N/A;   TUBAL LIGATION  1999   WISDOM TOOTH EXTRACTION     age 53    Family History  Problem Relation Age of Onset   Cancer Mother        brain   Diabetes Father    Hypertension Father    Kidney disease Father    Stroke Father    Cancer Father        prostate   Prostate cancer Father    Breast cancer Neg Hx    Colon cancer Neg Hx    Colon polyps Neg Hx    Esophageal cancer Neg Hx    Rectal cancer Neg Hx    Stomach cancer Neg Hx    Social History   Socioeconomic History   Marital status: Single    Spouse name: Not on file   Number of children: Not on file   Years of education:  Not on file   Highest education level: Not on file  Occupational History   Not on file  Tobacco Use   Smoking status: Former   Smokeless tobacco: Never  Substance and Sexual Activity   Alcohol use: No    Alcohol/week: 0.0 standard drinks of alcohol   Drug use: No   Sexual activity: Not on file  Other Topics Concern   Not on file  Social History Narrative   Not on file   Social Determinants of Health   Financial Resource Strain: Low Risk  (05/26/2020)   Overall Financial Resource Strain (CARDIA)    Difficulty of Paying Living Expenses: Not hard at all  Food Insecurity: Not on file  Transportation Needs: Not on file  Physical Activity: Sufficiently Active (11/14/2020)   Exercise Vital Sign    Days of Exercise per Week: 6 days    Minutes of Exercise per Session: 50 min  Stress: No Stress Concern Present (11/14/2020)   [employer] [place]    Feeling of  Stress : Only a little  Social Connections: Not on file     Review of Systems  Constitutional:  Negative for appetite change and unexpected weight change.  HENT:  Negative for congestion.   Respiratory:  Negative for cough, chest tightness and shortness of breath.   Cardiovascular:  Negative for chest pain, palpitations and leg swelling.  Gastrointestinal:  Negative for abdominal pain, diarrhea, nausea and vomiting.  Genitourinary:  Negative for difficulty urinating and dysuria.  Musculoskeletal:  Negative for joint swelling and myalgias.  Skin:  Negative for color change and rash.  Neurological:  Negative for dizziness, light-headedness and headaches.  Psychiatric/Behavioral:  Negative for agitation and dysphoric mood.        Objective:     BP 122/82 (BP Location: Left Arm, Patient Position: Sitting, Cuff Size: Normal)   Pulse 62   Temp 98.2 F (36.8 C) (Oral)   Ht '5\' 7"'  (1.702 m)   Wt 163 lb (73.9 kg)   SpO2 99%   BMI 25.53 kg/m  Wt Readings from Last 3  Encounters:  06/03/22 163 lb (73.9 kg)  05/27/22 165 lb (74.8 kg)  01/30/22 165 lb 3.2 oz (74.9 kg)    Physical Exam Vitals reviewed.  Constitutional:      General: She is not in acute distress.    Appearance: Normal appearance.  HENT:     Head: Normocephalic and atraumatic.     Right Ear: External ear normal.     Left Ear: External ear normal.  Eyes:     General: No scleral icterus.       Right eye: No discharge.        Left eye: No discharge.     Conjunctiva/sclera: Conjunctivae normal.  Neck:     Thyroid: No thyromegaly.  Cardiovascular:     Rate and Rhythm: Normal rate and regular rhythm.  Pulmonary:     Effort: No respiratory distress.     Breath sounds: Normal breath sounds. No wheezing.  Abdominal:     General: Bowel sounds are normal.     Palpations: Abdomen is soft.     Tenderness: There is no abdominal tenderness.  Musculoskeletal:        General: No swelling or tenderness.     Cervical back: Neck supple. No tenderness.  Lymphadenopathy:     Cervical: No cervical adenopathy.  Skin:    Findings: No erythema or rash.  Neurological:     Mental Status: She is alert.  Psychiatric:        Mood and Affect: Mood normal.        Behavior: Behavior normal.      Outpatient Encounter Medications as of 06/03/2022  Medication Sig   acetaminophen (TYLENOL) 500 MG tablet Take 1,000 mg by mouth every 6 (six) hours as needed for mild pain or moderate pain.   ALPRAZolam (XANAX) 0.25 MG tablet Take 1 tablet (0.25 mg total) by mouth daily as needed for anxiety.   buPROPion (WELLBUTRIN XL) 300 MG 24 hr tablet Take 1 tablet by mouth once daily   cholecalciferol (VITAMIN D3) 25 MCG (1000 UNIT) tablet Take 2,000 Units by mouth daily.   Cyanocobalamin (B-12 PO) Take by mouth.   Fe Fum-FePoly-Vit C-Vit B3 (INTEGRA) 62.5-62.5-40-3 MG CAPS Take 1 capsule by mouth once daily   levothyroxine (SYNTHROID) 88 MCG tablet TAKE 1 TABLET BY MOUTH ONCE DAILY BEFORE BREAKFAST   magnesium  oxide (MAG-OX) 400 (240 Mg) MG tablet Take 1 tablet (400 mg total) by mouth daily.  naproxen (NAPROSYN) 500 MG tablet Take 1 tablet (500 mg total) by mouth 2 (two) times daily with a meal.   rosuvastatin (CRESTOR) 10 MG tablet Take 1 tablet by mouth once daily   Semaglutide-Weight Management 2.4 MG/0.75ML SOAJ Inject 2.4 mg into the skin once a week.   [DISCONTINUED] nitrofurantoin, macrocrystal-monohydrate, (MACROBID) 100 MG capsule Take 1 capsule (100 mg total) by mouth 2 (two) times daily. Take with food. (Patient not taking: Reported on 06/03/2022)   No facility-administered encounter medications on file as of 06/03/2022.     Lab Results  Component Value Date   WBC 5.5 04/17/2022   HGB 11.9 (L) 04/17/2022   HCT 37.1 04/17/2022   PLT 233.0 04/17/2022   GLUCOSE 69 (L) 01/30/2022   CHOL 159 01/30/2022   TRIG 76.0 01/30/2022   HDL 71.90 01/30/2022   LDLDIRECT 178.3 11/11/2013   LDLCALC 72 01/30/2022   ALT 33 01/30/2022   AST 24 01/30/2022   NA 139 01/30/2022   K 4.0 01/30/2022   CL 106 01/30/2022   CREATININE 0.93 01/30/2022   BUN 9 01/30/2022   CO2 27 01/30/2022   TSH 2.68 01/30/2022   HGBA1C 5.7 01/30/2022    MM 3D SCREEN BREAST BILATERAL  Result Date: 03/19/2022 CLINICAL DATA:  Screening. EXAM: DIGITAL SCREENING BILATERAL MAMMOGRAM WITH TOMOSYNTHESIS AND CAD TECHNIQUE: Bilateral screening digital craniocaudal and mediolateral oblique mammograms were obtained. Bilateral screening digital breast tomosynthesis was performed. The images were evaluated with computer-aided detection. COMPARISON:  Previous exam(s). ACR Breast Density Category b: There are scattered areas of fibroglandular density. FINDINGS: There are no findings suspicious for malignancy. IMPRESSION: No mammographic evidence of malignancy. A result letter of this screening mammogram will be mailed directly to the patient. RECOMMENDATION: Screening mammogram in one year. (Code:SM-B-01Y) BI-RADS CATEGORY  1: Negative.  Electronically Signed   By: Kristopher Oppenheim M.D.   On: 03/19/2022 08:14       Assessment & Plan:   Problem List Items Addressed This Visit     Anemia    History of gastric surgery.  Periods as outlined.  Follow cbc.       Depression, recurrent ([place])    On wellbutrin 373m.  Has been seeing a therapist.  Discussed.  Does not feel needs any further intervention at this time.  Follow.        Essential hypertension, benign - Primary    Off medication.  Blood pressure doing well.        Hypercholesterolemia    On crestor. Low cholesterol diet and exercise.  Follow lipid panel and liver function tests.        Hyperglycemia    Has lost weight.  Follow met b and a1c.        Hypothyroidism    On thyroid replacement.  Follow tsh.       Irregular periods    Varying periods as outlined.  Discussed.  Increased PMS symptoms. On wellbutrin.  Discuss with gyn regarding further treatment.       Mild depression    Continue wellbutrin.  Follow.       Right hand pain    Evaluation - Dr SPeggye Ley       Status post laparoscopic sleeve gastrectomy    Has been on wegovy.  Tolerating.  Has lost weight.  Follow         CEinar Pheasant MD

## 2022-06-10 ENCOUNTER — Encounter: Payer: Self-pay | Admitting: Internal Medicine

## 2022-06-10 DIAGNOSIS — N926 Irregular menstruation, unspecified: Secondary | ICD-10-CM | POA: Insufficient documentation

## 2022-06-10 NOTE — Assessment & Plan Note (Signed)
Has lost weight.  Follow met b and a1c.   

## 2022-06-10 NOTE — Assessment & Plan Note (Signed)
Off medication.  Blood pressure doing well.

## 2022-06-10 NOTE — Assessment & Plan Note (Signed)
On wellbutrin 300mg.  Has been seeing a therapist.  Discussed.  Does not feel needs any further intervention at this time.  Follow.   

## 2022-06-10 NOTE — Assessment & Plan Note (Signed)
Continue wellbutrin.  Follow.  

## 2022-06-10 NOTE — Assessment & Plan Note (Signed)
Evaluation - Dr Stephenie Acres.

## 2022-06-10 NOTE — Assessment & Plan Note (Signed)
History of gastric surgery.  Periods as outlined.  Follow cbc.  

## 2022-06-10 NOTE — Assessment & Plan Note (Signed)
Varying periods as outlined.  Discussed.  Increased PMS symptoms. On wellbutrin.  Discuss with gyn regarding further treatment.

## 2022-06-10 NOTE — Assessment & Plan Note (Signed)
On crestor.  Low cholesterol diet and exercise.  Follow lipid panel and liver function tests.   

## 2022-06-10 NOTE — Assessment & Plan Note (Signed)
Has been on wegovy.  Tolerating.  Has lost weight.  Follow

## 2022-06-10 NOTE — Assessment & Plan Note (Signed)
On thyroid replacement.  Follow tsh.  

## 2022-06-12 ENCOUNTER — Other Ambulatory Visit: Payer: Self-pay

## 2022-06-12 DIAGNOSIS — E7800 Pure hypercholesterolemia, unspecified: Secondary | ICD-10-CM

## 2022-06-12 MED ORDER — ROSUVASTATIN CALCIUM 10 MG PO TABS: 10.0000 mg | ORAL_TABLET | Freq: Every day | ORAL | 1 refills | Status: DC

## 2022-06-26 ENCOUNTER — Telehealth: Payer: Self-pay | Admitting: Internal Medicine

## 2022-06-26 DIAGNOSIS — N926 Irregular menstruation, unspecified: Secondary | ICD-10-CM

## 2022-06-26 DIAGNOSIS — N943 Premenstrual tension syndrome: Secondary | ICD-10-CM

## 2022-06-26 NOTE — Telephone Encounter (Signed)
My chart message sent regarding irregular periods.

## 2022-06-26 NOTE — Addendum Note (Signed)
Addended by: Alisa Graff on: 06/26/2022 09:56 PM   Modules accepted: Orders

## 2022-06-26 NOTE — Telephone Encounter (Signed)
Order placed for gyn referral.  

## 2022-06-26 NOTE — Addendum Note (Signed)
Addended by: Alisa Graff on: 06/26/2022 09:59 PM   Modules accepted: Orders

## 2022-06-30 ENCOUNTER — Other Ambulatory Visit: Payer: Self-pay | Admitting: Family

## 2022-07-01 ENCOUNTER — Other Ambulatory Visit: Payer: Self-pay

## 2022-07-01 MED ORDER — INTEGRA 62.5-62.5-40-3 MG PO CAPS: ORAL_CAPSULE | ORAL | 0 refills | Status: DC

## 2022-07-03 ENCOUNTER — Other Ambulatory Visit: Payer: Self-pay

## 2022-07-18 ENCOUNTER — Other Ambulatory Visit: Payer: Self-pay | Admitting: Internal Medicine

## 2022-07-27 ENCOUNTER — Other Ambulatory Visit: Payer: Self-pay | Admitting: Internal Medicine

## 2022-07-30 ENCOUNTER — Other Ambulatory Visit: Payer: Self-pay | Admitting: Internal Medicine

## 2022-07-30 DIAGNOSIS — Z6829 Body mass index (BMI) 29.0-29.9, adult: Secondary | ICD-10-CM

## 2022-08-06 ENCOUNTER — Encounter: Payer: Self-pay | Admitting: Internal Medicine

## 2022-08-08 ENCOUNTER — Other Ambulatory Visit: Payer: Self-pay

## 2022-08-08 ENCOUNTER — Telehealth: Payer: Self-pay | Admitting: Internal Medicine

## 2022-08-08 ENCOUNTER — Telehealth: Payer: Self-pay

## 2022-08-08 DIAGNOSIS — E7800 Pure hypercholesterolemia, unspecified: Secondary | ICD-10-CM

## 2022-08-08 DIAGNOSIS — D649 Anemia, unspecified: Secondary | ICD-10-CM

## 2022-08-08 DIAGNOSIS — E039 Hypothyroidism, unspecified: Secondary | ICD-10-CM

## 2022-08-08 DIAGNOSIS — I10 Essential (primary) hypertension: Secondary | ICD-10-CM

## 2022-08-08 DIAGNOSIS — R739 Hyperglycemia, unspecified: Secondary | ICD-10-CM

## 2022-08-08 NOTE — Telephone Encounter (Signed)
[name] Nielsen ([place]: [place]) Rx #: [id number] [phone] 2.4MG /0.75ML auto-injectors   Form [facility] Electronic Prior Authorization Form (2017 NCPDP) Created 6 days ago Sent to Plan 1 hour ago Plan Response 1 hour ago Submit Clinical Questions 1 hour ago Determination Favorable 1 hour ago Your prior authorization for [name] Nielsen has been approved! MORE INFO Personalized support and financial assistance may be available through the [employer] program. For more information, and to see program requirements, click on the More Info button to the right.  Message from plan: Request Reference Number: [id number]. WEGOVY INJ 2.4MG  is approved through 02/06/2023. Your patient may now fill this prescription and it will be covered.

## 2022-08-08 NOTE — Telephone Encounter (Signed)
[name] Nielsen (Key: [place]) Rx #: [id number] [phone] 2.4MG /0.75ML auto-injectors   Form [facility] Electronic Prior Authorization Form (2017 NCPDP) Created 6 days ago Sent to Plan 2 minutes ago Plan Response 1 minute ago Submit Clinical Questions less than a minute ago Determination Wait for Determination Please wait for [facility] 2017 NCPDP to return a determination.

## 2022-08-08 NOTE — Telephone Encounter (Signed)
Patient has a lab appt 08/12/2022, there are no orders in. 

## 2022-08-08 NOTE — Telephone Encounter (Signed)
Orders placed.

## 2022-08-12 ENCOUNTER — Encounter: Payer: Self-pay | Admitting: Internal Medicine

## 2022-08-12 ENCOUNTER — Ambulatory Visit: Payer: Managed Care, Other (non HMO) | Admitting: Internal Medicine

## 2022-08-12 ENCOUNTER — Other Ambulatory Visit: Payer: Managed Care, Other (non HMO)

## 2022-08-12 VITALS — BP 136/84 | HR 63 | Temp 98.1°F | Resp 17 | Ht 67.0 in | Wt 166.5 lb

## 2022-08-12 DIAGNOSIS — D649 Anemia, unspecified: Secondary | ICD-10-CM

## 2022-08-12 DIAGNOSIS — I10 Essential (primary) hypertension: Secondary | ICD-10-CM | POA: Diagnosis not present

## 2022-08-12 DIAGNOSIS — R739 Hyperglycemia, unspecified: Secondary | ICD-10-CM

## 2022-08-12 DIAGNOSIS — R7989 Other specified abnormal findings of blood chemistry: Secondary | ICD-10-CM | POA: Diagnosis not present

## 2022-08-12 DIAGNOSIS — N926 Irregular menstruation, unspecified: Secondary | ICD-10-CM

## 2022-08-12 DIAGNOSIS — E7800 Pure hypercholesterolemia, unspecified: Secondary | ICD-10-CM

## 2022-08-12 DIAGNOSIS — E039 Hypothyroidism, unspecified: Secondary | ICD-10-CM

## 2022-08-12 DIAGNOSIS — F339 Major depressive disorder, recurrent, unspecified: Secondary | ICD-10-CM | POA: Diagnosis not present

## 2022-08-12 DIAGNOSIS — J3489 Other specified disorders of nose and nasal sinuses: Secondary | ICD-10-CM

## 2022-08-12 NOTE — Addendum Note (Signed)
Addended by: Desta Bujak S on: 08/12/2022 02:05 PM   Modules accepted: Orders  

## 2022-08-12 NOTE — Addendum Note (Signed)
Addended by: Hermon Zea S on: 08/12/2022 02:05 PM   Modules accepted: Orders  

## 2022-08-12 NOTE — Addendum Note (Signed)
Addended by: Neta Ehlers on: 08/12/2022 02:05 PM   Modules accepted: Orders

## 2022-08-12 NOTE — Addendum Note (Signed)
Addended by: Shawntell Dixson S on: 08/12/2022 02:05 PM   Modules accepted: Orders  

## 2022-08-12 NOTE — Addendum Note (Signed)
Addended by: Neta Ehlers on: 08/12/2022 02:06 PM   Modules accepted: Orders

## 2022-08-12 NOTE — Patient Instructions (Signed)
Saline nasal spray - flush nose two times per day ? ?Nasacort nasal spray - 2 sprays each nostril one time per day.  Do this in the evening.   ?

## 2022-08-12 NOTE — Addendum Note (Signed)
Addended by: Ariellah Faust S on: 08/12/2022 02:05 PM   Modules accepted: Orders  

## 2022-08-12 NOTE — Progress Notes (Signed)
Patient ID: [name] Nielsen, female   DOB: 22-Oct-1975, 46 y.o.   MRN: [id number]   Subjective:    Patient ID: [name] Nielsen, female    DOB: [birth year], 46 y.o.   MRN: [id number]   Patient here for  Chief Complaint  Patient presents with   Follow-up   .   HPI Here to follow up regarding increased stress, hypertension and hypercholesterolemia.  Of lisinopril.  Doing well off.  No dizziness reported.  Tries to stay active.  No chest pain or sob reported.  No abdominal pain or bowel change reported.  On wellbutrin.  Feels handling things relatively well.  Periods irregular - no MP in October.  Periods 06/2022 and no period in August.  Some drainage with scratchy throat.  Just started.  No chest congestion or increased cough.    Past Medical History:  Diagnosis Date   Anemia    hx of    Anxiety    COVID-19 virus infection 11/2019   Depression    Frequent headaches    hx of   Heart murmur    Hx: UTI (urinary tract infection)    child, s/p urinary procecure   Hypercholesterolemia    Hypertension    Hypothyroidism    Past Surgical History:  Procedure Laterality Date   CHOLECYSTECTOMY  2012   COLONOSCOPY     > 10 yrs ago- normal    LAPAROSCOPIC GASTRIC SLEEVE RESECTION N/A 07/05/2014   Procedure: LAPAROSCOPIC GASTRIC SLEEVE RESECTION with upper endoscopy;  Surgeon: Kaylyn Lim, MD;  Location: [facility];  Service: General;  Laterality: N/A;   TUBAL LIGATION  1999   WISDOM TOOTH EXTRACTION     age 72    Family History  Problem Relation Age of Onset   Cancer Mother        brain   Diabetes Father    Hypertension Father    Kidney disease Father    Stroke Father    Cancer Father        prostate   Prostate cancer Father    Breast cancer Neg Hx    Colon cancer Neg Hx    Colon polyps Neg Hx    Esophageal cancer Neg Hx    Rectal cancer Neg Hx    Stomach cancer Neg Hx    Social History   Socioeconomic History   Marital status: Single    Spouse name: Not on file   Number of  children: Not on file   Years of education: Not on file   Highest education level: Not on file  Occupational History   Not on file  Tobacco Use   Smoking status: Former   Smokeless tobacco: Never  Substance and Sexual Activity   Alcohol use: No    Alcohol/week: 0.0 standard drinks of alcohol   Drug use: No   Sexual activity: Not on file  Other Topics Concern   Not on file  Social History Narrative   Not on file   Social Determinants of Health   Financial Resource Strain: Low Risk  (05/26/2020)   Overall Financial Resource Strain (CARDIA)    Difficulty of Paying Living Expenses: Not hard at all  Food Insecurity: Not on file  Transportation Needs: Not on file  Physical Activity: Sufficiently Active (11/14/2020)   Exercise Vital Sign    Days of Exercise per Week: 6 days    Minutes of Exercise per Session: 50 min  Stress: No Stress Concern Present (11/14/2020)   [place] -  Occupational Stress Questionnaire    Feeling of Stress : Only a little  Social Connections: Not on file     Review of Systems  Constitutional:  Negative for appetite change and unexpected weight change.  HENT:  Positive for postnasal drip.        Scratchy throat.   Respiratory:  Negative for cough, chest tightness and shortness of breath.   Cardiovascular:  Negative for chest pain, palpitations and leg swelling.  Gastrointestinal:  Negative for abdominal pain, diarrhea, nausea and vomiting.  Genitourinary:  Negative for difficulty urinating and dysuria.  Musculoskeletal:  Negative for joint swelling and myalgias.  Skin:  Negative for color change and rash.  Neurological:  Negative for dizziness, light-headedness and headaches.  Psychiatric/Behavioral:  Negative for agitation and dysphoric mood.        Objective:     BP 136/84 (BP Location: Left Arm, Patient Position: Sitting, Cuff Size: Small)   Pulse 63   Temp 98.1 F (36.7 C) (Temporal)   Resp 17   Ht _0  (1.702  m)   Wt 166 lb 7.4 oz (75.5 kg)   SpO2 98%   BMI 26.07 kg/m  Wt Readings from Last 3 Encounters:  08/12/22 166 lb 7.4 oz (75.5 kg)  06/03/22 163 lb (73.9 kg)  05/27/22 165 lb (74.8 kg)    Physical Exam Vitals reviewed.  Constitutional:      General: She is not in acute distress.    Appearance: Normal appearance.  HENT:     Head: Normocephalic and atraumatic.     Right Ear: External ear normal.     Left Ear: External ear normal.  Eyes:     General: No scleral icterus.       Right eye: No discharge.        Left eye: No discharge.     Conjunctiva/sclera: Conjunctivae normal.  Neck:     Thyroid: No thyromegaly.  Cardiovascular:     Rate and Rhythm: Normal rate and regular rhythm.  Pulmonary:     Effort: No respiratory distress.     Breath sounds: Normal breath sounds. No wheezing.  Abdominal:     General: Bowel sounds are normal.     Palpations: Abdomen is soft.     Tenderness: There is no abdominal tenderness.  Musculoskeletal:        General: No swelling or tenderness.     Cervical back: Neck supple. No tenderness.  Lymphadenopathy:     Cervical: No cervical adenopathy.  Skin:    Findings: No erythema or rash.  Neurological:     Mental Status: She is alert.  Psychiatric:        Mood and Affect: Mood normal.        Behavior: Behavior normal.      Outpatient Encounter Medications as of 08/12/2022  Medication Sig   acetaminophen (TYLENOL) 500 MG tablet Take 1,000 mg by mouth every 6 (six) hours as needed for mild pain or moderate pain.   ALPRAZolam (XANAX) 0.25 MG tablet Take 1 tablet (0.25 mg total) by mouth daily as needed for anxiety.   buPROPion (WELLBUTRIN XL) 300 MG 24 hr tablet Take 1 tablet by mouth once daily   cholecalciferol (VITAMIN D3) 25 MCG (1000 UNIT) tablet Take 2,000 Units by mouth daily.   Cyanocobalamin (B-12 PO) Take by mouth.   Fe Fum-FePoly-Vit C-Vit B3 (INTEGRA) 62.5-62.5-40-3 MG CAPS Take 1 capsule by mouth once daily   levothyroxine  (SYNTHROID) 88 MCG tablet TAKE 1 TABLET BY  MOUTH ONCE DAILY BEFORE BREAKFAST   magnesium oxide (MAG-OX) 400 (240 Mg) MG tablet Take 1 tablet (400 mg total) by mouth daily.   naproxen (NAPROSYN) 500 MG tablet Take 1 tablet (500 mg total) by mouth 2 (two) times daily with a meal.   rosuvastatin (CRESTOR) 10 MG tablet Take 1 tablet (10 mg total) by mouth daily.   WEGOVY 2.4 MG/0.75ML SOAJ INJECT 2.4 MG INTO THE SKIN ONCE A WEEK.   No facility-administered encounter medications on file as of 08/12/2022.     Lab Results  Component Value Date   WBC 5.5 08/12/2022   HGB 12.9 08/12/2022   HCT 40.2 08/12/2022   PLT 248 08/12/2022   GLUCOSE 76 08/12/2022   CHOL 183 08/12/2022   TRIG 95 08/12/2022   HDL 87 08/12/2022   LDLDIRECT 178.3 11/11/2013   LDLCALC 79 08/12/2022   ALT 94 (H) 08/12/2022   AST 49 (H) 08/12/2022   NA 142 08/12/2022   K 3.9 08/12/2022   CL 103 08/12/2022   CREATININE 1.08 (H) 08/12/2022   BUN 10 08/12/2022   CO2 23 08/12/2022   TSH 2.500 08/12/2022   HGBA1C 5.2 08/12/2022    MM 3D SCREEN BREAST BILATERAL  Result Date: 03/19/2022 CLINICAL DATA:  Screening. EXAM: DIGITAL SCREENING BILATERAL MAMMOGRAM WITH TOMOSYNTHESIS AND CAD TECHNIQUE: Bilateral screening digital craniocaudal and mediolateral oblique mammograms were obtained. Bilateral screening digital breast tomosynthesis was performed. The images were evaluated with computer-aided detection. COMPARISON:  Previous exam(s). ACR Breast Density Category b: There are scattered areas of fibroglandular density. FINDINGS: There are no findings suspicious for malignancy. IMPRESSION: No mammographic evidence of malignancy. A result letter of this screening mammogram will be mailed directly to the patient. RECOMMENDATION: Screening mammogram in one year. (Code:SM-B-01Y) BI-RADS CATEGORY  1: Negative. Electronically Signed   By: Kristopher Oppenheim M.D.   On: 03/19/2022 08:14       Assessment & Plan:   Problem List Items Addressed  This Visit     Abnormal liver function test    Follow liver function.       Anemia    History of gastric surgery.  Periods as outlined.  Follow cbc.       Depression, recurrent ([place])    On wellbutrin 32m.  Has been seeing a therapist.  Discussed.  Does not feel needs any further intervention at this time.  Follow.        Essential hypertension, benign - Primary    Off medication.  Blood pressure has been doing well.  Spot check pressures.  Hold on medication.  Call with update.       Hypercholesterolemia    On crestor. Low cholesterol diet and exercise.  Follow lipid panel and liver function tests.        Hyperglycemia    Has lost weight.  Follow met b and a1c.        Hypothyroidism    On thyroid replacement.  Follow tsh.       Irregular periods    Period as outlined.  Keep menstrual diary.        Sinus drainage    Saline nasal spray and steroid nasal spray as directed.  Follow.          CEinar Pheasant MD

## 2022-08-13 ENCOUNTER — Other Ambulatory Visit: Payer: Self-pay | Admitting: Internal Medicine

## 2022-08-13 ENCOUNTER — Other Ambulatory Visit: Payer: Self-pay

## 2022-08-13 ENCOUNTER — Telehealth: Payer: Self-pay

## 2022-08-13 ENCOUNTER — Encounter: Payer: Self-pay | Admitting: Internal Medicine

## 2022-08-13 DIAGNOSIS — R7989 Other specified abnormal findings of blood chemistry: Secondary | ICD-10-CM

## 2022-08-13 DIAGNOSIS — R748 Abnormal levels of other serum enzymes: Secondary | ICD-10-CM

## 2022-08-13 DIAGNOSIS — R944 Abnormal results of kidney function studies: Secondary | ICD-10-CM

## 2022-08-13 LAB — BASIC METABOLIC PANEL
BUN/Creatinine Ratio: 9 (ref 9–23)
BUN: 10 mg/dL (ref 6–24)
CO2: 23 mmol/L (ref 20–29)
Calcium: 9.6 mg/dL (ref 8.7–10.2)
Chloride: 103 mmol/L (ref 96–106)
Creatinine, Ser: 1.08 mg/dL — ABNORMAL HIGH (ref 0.57–1.00)
Glucose: 76 mg/dL (ref 70–99)
Potassium: 3.9 mmol/L (ref 3.5–5.2)
Sodium: 142 mmol/L (ref 134–144)
eGFR: 63 mL/min/{1.73_m2} (ref >59)

## 2022-08-13 LAB — CBC WITH DIFFERENTIAL/PLATELET
Basophils Absolute: 0.1 10*3/uL (ref 0.0–0.2)
Basos: 1 %
EOS (ABSOLUTE): 0.1 10*3/uL (ref 0.0–0.4)
Eos: 2 %
Hematocrit: 40.2 % (ref 34.0–46.6)
Hemoglobin: 12.9 g/dL (ref 11.1–15.9)
Immature Grans (Abs): 0 10*3/uL (ref 0.0–0.1)
Immature Granulocytes: 0 %
Lymphocytes Absolute: 1.4 10*3/uL (ref 0.7–3.1)
Lymphs: 26 %
MCH: 25.1 pg — ABNORMAL LOW (ref 26.6–33.0)
MCHC: 32.1 g/dL (ref 31.5–35.7)
MCV: 78 fL — ABNORMAL LOW (ref 79–97)
Monocytes Absolute: 0.5 10*3/uL (ref 0.1–0.9)
Monocytes: 9 %
Neutrophils Absolute: 3.4 10*3/uL (ref 1.4–7.0)
Neutrophils: 62 %
Platelets: 248 10*3/uL (ref 150–450)
RBC: 5.13 x10E6/uL (ref 3.77–5.28)
RDW: 13.3 % (ref 11.7–15.4)
WBC: 5.5 10*3/uL (ref 3.4–10.8)

## 2022-08-13 LAB — TSH: TSH: 2.5 u[IU]/mL (ref 0.450–4.500)

## 2022-08-13 LAB — IRON,TIBC AND FERRITIN PANEL
Ferritin: 48 ng/mL (ref 15–150)
Iron Saturation: 24 % (ref 15–55)
Iron: 74 ug/dL (ref 27–159)
Total Iron Binding Capacity: 308 ug/dL (ref 250–450)
UIBC: 234 ug/dL (ref 131–425)

## 2022-08-13 LAB — HEPATIC FUNCTION PANEL
ALT: 94 IU/L — ABNORMAL HIGH (ref 0–32)
AST: 49 IU/L — ABNORMAL HIGH (ref 0–40)
Albumin: 4.7 g/dL (ref 3.9–4.9)
Alkaline Phosphatase: 48 IU/L (ref 44–121)
Bilirubin Total: 0.4 mg/dL (ref 0.0–1.2)
Bilirubin, Direct: 0.11 mg/dL (ref 0.00–0.40)
Total Protein: 7.4 g/dL (ref 6.0–8.5)

## 2022-08-13 LAB — LIPID PANEL
Chol/HDL Ratio: 2.1 ratio (ref 0.0–4.4)
Cholesterol, Total: 183 mg/dL (ref 100–199)
HDL: 87 mg/dL (ref >39)
LDL Chol Calc (NIH): 79 mg/dL (ref 0–99)
Triglycerides: 95 mg/dL (ref 0–149)
VLDL Cholesterol Cal: 17 mg/dL (ref 5–40)

## 2022-08-13 LAB — HEMOGLOBIN A1C
Est. average glucose Bld gHb Est-mCnc: 103 mg/dL
Hgb A1c MFr Bld: 5.2 % (ref 4.8–5.6)

## 2022-08-13 NOTE — Telephone Encounter (Signed)
See result note.  

## 2022-08-13 NOTE — Progress Notes (Signed)
Order placed for abdominal ultrasound.   

## 2022-08-13 NOTE — Telephone Encounter (Signed)
Pt was returning Judson Roch, [employer] call.  Judson Roch was unavailable told pt I would send her the msg.

## 2022-08-13 NOTE — Telephone Encounter (Signed)
LMTCB for lab results & future labs ordered.

## 2022-08-14 ENCOUNTER — Other Ambulatory Visit: Payer: Self-pay | Admitting: Internal Medicine

## 2022-08-14 DIAGNOSIS — R7989 Other specified abnormal findings of blood chemistry: Secondary | ICD-10-CM

## 2022-08-15 ENCOUNTER — Ambulatory Visit
Admission: RE | Admit: 2022-08-15 | Discharge: 2022-08-15 | Disposition: A | Discharge status: Home / Self Care | Payer: Managed Care, Other (non HMO) | Source: Ambulatory Visit | Attending: Internal Medicine | Admitting: Internal Medicine

## 2022-08-15 DIAGNOSIS — R7989 Other specified abnormal findings of blood chemistry: Secondary | ICD-10-CM | POA: Insufficient documentation

## 2022-08-18 ENCOUNTER — Encounter: Payer: Self-pay | Admitting: Internal Medicine

## 2022-08-18 DIAGNOSIS — J3489 Other specified disorders of nose and nasal sinuses: Secondary | ICD-10-CM | POA: Insufficient documentation

## 2022-08-18 NOTE — Assessment & Plan Note (Signed)
On thyroid replacement.  Follow tsh.  

## 2022-08-18 NOTE — Assessment & Plan Note (Signed)
Has lost weight.  Follow met b and a1c.   

## 2022-08-18 NOTE — Assessment & Plan Note (Signed)
On crestor.  Low cholesterol diet and exercise.  Follow lipid panel and liver function tests.   

## 2022-08-18 NOTE — Assessment & Plan Note (Signed)
Follow liver function.  ?

## 2022-08-18 NOTE — Assessment & Plan Note (Signed)
History of gastric surgery.  Periods as outlined.  Follow cbc.

## 2022-08-18 NOTE — Assessment & Plan Note (Signed)
On wellbutrin 300mg .  Has been seeing a therapist.  Discussed.  Does not feel needs any further intervention at this time.  Follow.

## 2022-08-18 NOTE — Assessment & Plan Note (Signed)
Off medication.  Blood pressure has been doing well.  Spot check pressures.  Hold on medication.  Call with update.

## 2022-08-18 NOTE — Assessment & Plan Note (Signed)
Saline nasal spray and steroid nasal spray as directed.  Follow.   

## 2022-08-18 NOTE — Assessment & Plan Note (Signed)
Period as outlined.  Keep menstrual diary.

## 2022-08-19 ENCOUNTER — Other Ambulatory Visit (INDEPENDENT_AMBULATORY_CARE_PROVIDER_SITE_OTHER): Payer: Managed Care, Other (non HMO)

## 2022-08-19 DIAGNOSIS — R748 Abnormal levels of other serum enzymes: Secondary | ICD-10-CM

## 2022-08-19 DIAGNOSIS — R944 Abnormal results of kidney function studies: Secondary | ICD-10-CM

## 2022-08-19 LAB — HEPATIC FUNCTION PANEL
ALT: 50 U/L — ABNORMAL HIGH (ref 0–35)
AST: 26 U/L (ref 0–37)
Albumin: 4.6 g/dL (ref 3.5–5.2)
Alkaline Phosphatase: 38 U/L — ABNORMAL LOW (ref 39–117)
Bilirubin, Direct: 0.1 mg/dL (ref 0.0–0.3)
Total Bilirubin: 0.5 mg/dL (ref 0.2–1.2)
Total Protein: 7.3 g/dL (ref 6.0–8.3)

## 2022-08-19 LAB — BASIC METABOLIC PANEL
BUN: 10 mg/dL (ref 6–23)
CO2: 30 mEq/L (ref 19–32)
Calcium: 9.6 mg/dL (ref 8.4–10.5)
Chloride: 104 mEq/L (ref 96–112)
Creatinine, Ser: 0.99 mg/dL (ref 0.40–1.20)
GFR: 67.28 mL/min (ref >60.00)
Glucose, Bld: 83 mg/dL (ref 70–99)
Potassium: 4.2 mEq/L (ref 3.5–5.1)
Sodium: 140 mEq/L (ref 135–145)

## 2022-08-20 ENCOUNTER — Other Ambulatory Visit: Payer: Self-pay

## 2022-08-20 DIAGNOSIS — R7989 Other specified abnormal findings of blood chemistry: Secondary | ICD-10-CM

## 2022-08-20 DIAGNOSIS — R748 Abnormal levels of other serum enzymes: Secondary | ICD-10-CM

## 2022-08-22 ENCOUNTER — Encounter: Payer: Self-pay | Admitting: Internal Medicine

## 2022-08-22 ENCOUNTER — Other Ambulatory Visit: Payer: Self-pay | Admitting: Internal Medicine

## 2022-08-26 ENCOUNTER — Other Ambulatory Visit: Payer: Self-pay | Admitting: Internal Medicine

## 2022-08-26 DIAGNOSIS — Z6829 Body mass index (BMI) 29.0-29.9, adult: Secondary | ICD-10-CM

## 2022-09-08 ENCOUNTER — Other Ambulatory Visit: Payer: Self-pay | Admitting: Internal Medicine

## 2022-09-16 ENCOUNTER — Other Ambulatory Visit: Payer: Self-pay | Admitting: Internal Medicine

## 2022-09-16 DIAGNOSIS — Z6829 Body mass index (BMI) 29.0-29.9, adult: Secondary | ICD-10-CM

## 2022-09-19 ENCOUNTER — Other Ambulatory Visit: Payer: Managed Care, Other (non HMO)

## 2022-09-19 ENCOUNTER — Other Ambulatory Visit (INDEPENDENT_AMBULATORY_CARE_PROVIDER_SITE_OTHER): Payer: Managed Care, Other (non HMO)

## 2022-09-19 DIAGNOSIS — R748 Abnormal levels of other serum enzymes: Secondary | ICD-10-CM

## 2022-09-19 DIAGNOSIS — R7989 Other specified abnormal findings of blood chemistry: Secondary | ICD-10-CM

## 2022-09-20 LAB — HEPATIC FUNCTION PANEL
ALT: 33 U/L (ref 0–35)
AST: 21 U/L (ref 0–37)
Albumin: 4.5 g/dL (ref 3.5–5.2)
Alkaline Phosphatase: 40 U/L (ref 39–117)
Bilirubin, Direct: 0.1 mg/dL (ref 0.0–0.3)
Total Bilirubin: 0.5 mg/dL (ref 0.2–1.2)
Total Protein: 7.2 g/dL (ref 6.0–8.3)

## 2022-10-19 ENCOUNTER — Other Ambulatory Visit: Payer: Self-pay | Admitting: Internal Medicine

## 2022-10-19 DIAGNOSIS — Z6829 Body mass index (BMI) 29.0-29.9, adult: Secondary | ICD-10-CM

## 2022-10-22 ENCOUNTER — Other Ambulatory Visit: Payer: Self-pay | Admitting: Internal Medicine

## 2022-10-24 NOTE — Telephone Encounter (Signed)
Rx ok'd for wellbutrin #30 with 3 refills.

## 2022-11-13 ENCOUNTER — Encounter: Payer: Managed Care, Other (non HMO) | Admitting: Internal Medicine

## 2022-11-22 ENCOUNTER — Other Ambulatory Visit: Payer: Self-pay | Admitting: Internal Medicine

## 2022-12-09 ENCOUNTER — Other Ambulatory Visit: Payer: Self-pay | Admitting: Internal Medicine

## 2022-12-11 MED ORDER — ALPRAZOLAM 0.25 MG PO TABS: 0.2500 mg | ORAL_TABLET | Freq: Every day | ORAL | 0 refills | Status: AC | PRN

## 2022-12-11 NOTE — Telephone Encounter (Signed)
Patient says she thinks she is ok to wait until her appt on 4/3. She would like to have the medication refilled and then if something changes and feels like she should be seen sooner than her scheduled appt she will reach out.

## 2022-12-11 NOTE — Telephone Encounter (Signed)
I do not mind sending in a few xanax for her to have, but need to confirm if doing ok.  Does she need earlier appt?

## 2022-12-18 ENCOUNTER — Other Ambulatory Visit: Payer: Self-pay | Admitting: Internal Medicine

## 2022-12-18 DIAGNOSIS — E7800 Pure hypercholesterolemia, unspecified: Secondary | ICD-10-CM

## 2023-01-08 ENCOUNTER — Encounter: Payer: Self-pay | Admitting: Internal Medicine

## 2023-01-08 ENCOUNTER — Other Ambulatory Visit (HOSPITAL_COMMUNITY)
Admission: RE | Admit: 2023-01-08 | Discharge: 2023-01-08 | Disposition: A | Discharge status: Home / Self Care | Payer: No Typology Code available for payment source | Source: Ambulatory Visit | Attending: Internal Medicine | Admitting: Internal Medicine

## 2023-01-08 ENCOUNTER — Ambulatory Visit (INDEPENDENT_AMBULATORY_CARE_PROVIDER_SITE_OTHER): Payer: No Typology Code available for payment source | Admitting: Internal Medicine

## 2023-01-08 VITALS — BP 124/70 | HR 62 | Temp 98.0°F | Resp 16 | Ht 67.0 in | Wt 176.0 lb

## 2023-01-08 DIAGNOSIS — Z124 Encounter for screening for malignant neoplasm of cervix: Secondary | ICD-10-CM | POA: Insufficient documentation

## 2023-01-08 DIAGNOSIS — D649 Anemia, unspecified: Secondary | ICD-10-CM | POA: Diagnosis not present

## 2023-01-08 DIAGNOSIS — E7800 Pure hypercholesterolemia, unspecified: Secondary | ICD-10-CM

## 2023-01-08 DIAGNOSIS — F419 Anxiety disorder, unspecified: Secondary | ICD-10-CM

## 2023-01-08 DIAGNOSIS — F32A Depression, unspecified: Secondary | ICD-10-CM

## 2023-01-08 DIAGNOSIS — F339 Major depressive disorder, recurrent, unspecified: Secondary | ICD-10-CM

## 2023-01-08 DIAGNOSIS — Z0000 Encounter for general adult medical examination without abnormal findings: Secondary | ICD-10-CM

## 2023-01-08 DIAGNOSIS — E039 Hypothyroidism, unspecified: Secondary | ICD-10-CM

## 2023-01-08 DIAGNOSIS — I10 Essential (primary) hypertension: Secondary | ICD-10-CM

## 2023-01-08 DIAGNOSIS — R739 Hyperglycemia, unspecified: Secondary | ICD-10-CM

## 2023-01-08 DIAGNOSIS — N926 Irregular menstruation, unspecified: Secondary | ICD-10-CM

## 2023-01-08 DIAGNOSIS — Z6829 Body mass index (BMI) 29.0-29.9, adult: Secondary | ICD-10-CM

## 2023-01-08 LAB — LIPID PANEL
Cholesterol: 170 mg/dL (ref 0–200)
HDL: 77.3 mg/dL (ref >39.00)
LDL Cholesterol: 80 mg/dL (ref 0–99)
NonHDL: 92.2
Total CHOL/HDL Ratio: 2
Triglycerides: 59 mg/dL (ref 0.0–149.0)
VLDL: 11.8 mg/dL (ref 0.0–40.0)

## 2023-01-08 LAB — CBC WITH DIFFERENTIAL/PLATELET
Basophils Absolute: 0 10*3/uL (ref 0.0–0.1)
Basophils Relative: 0.3 % (ref 0.0–3.0)
Eosinophils Absolute: 0.1 10*3/uL (ref 0.0–0.7)
Eosinophils Relative: 2 % (ref 0.0–5.0)
HCT: 36.7 % (ref 36.0–46.0)
Hemoglobin: 12.2 g/dL (ref 12.0–15.0)
Lymphocytes Relative: 42.5 % (ref 12.0–46.0)
Lymphs Abs: 1.5 10*3/uL (ref 0.7–4.0)
MCHC: 33.2 g/dL (ref 30.0–36.0)
MCV: 77.2 fl — ABNORMAL LOW (ref 78.0–100.0)
Monocytes Absolute: 0.4 10*3/uL (ref 0.1–1.0)
Monocytes Relative: 11.1 % (ref 3.0–12.0)
Neutro Abs: 1.6 10*3/uL (ref 1.4–7.7)
Neutrophils Relative %: 44.1 % (ref 43.0–77.0)
Platelets: 260 10*3/uL (ref 150.0–400.0)
RBC: 4.76 Mil/uL (ref 3.87–5.11)
RDW: 13.8 % (ref 11.5–15.5)
WBC: 3.6 10*3/uL — ABNORMAL LOW (ref 4.0–10.5)

## 2023-01-08 LAB — HEPATIC FUNCTION PANEL
ALT: 36 U/L — ABNORMAL HIGH (ref 0–35)
AST: 22 U/L (ref 0–37)
Albumin: 4.4 g/dL (ref 3.5–5.2)
Alkaline Phosphatase: 42 U/L (ref 39–117)
Bilirubin, Direct: 0.1 mg/dL (ref 0.0–0.3)
Total Bilirubin: 0.4 mg/dL (ref 0.2–1.2)
Total Protein: 6.8 g/dL (ref 6.0–8.3)

## 2023-01-08 LAB — BASIC METABOLIC PANEL
BUN: 8 mg/dL (ref 6–23)
CO2: 28 mEq/L (ref 19–32)
Calcium: 9.3 mg/dL (ref 8.4–10.5)
Chloride: 106 mEq/L (ref 96–112)
Creatinine, Ser: 1.05 mg/dL (ref 0.40–1.20)
GFR: 62.53 mL/min (ref >60.00)
Glucose, Bld: 82 mg/dL (ref 70–99)
Potassium: 4.3 mEq/L (ref 3.5–5.1)
Sodium: 139 mEq/L (ref 135–145)

## 2023-01-08 LAB — FERRITIN: Ferritin: 12.1 ng/mL (ref 10.0–291.0)

## 2023-01-08 LAB — HEMOGLOBIN A1C: Hgb A1c MFr Bld: 5.4 % (ref 4.6–6.5)

## 2023-01-08 MED ORDER — INTEGRA 62.5-62.5-40-3 MG PO CAPS: 1.0000 | ORAL_CAPSULE | Freq: Every day | ORAL | 3 refills | Status: DC

## 2023-01-08 MED ORDER — ROSUVASTATIN CALCIUM 10 MG PO TABS: 10.0000 mg | ORAL_TABLET | Freq: Every day | ORAL | 3 refills | Status: DC

## 2023-01-08 MED ORDER — MAGNESIUM OXIDE -MG SUPPLEMENT 400 (240 MG) MG PO TABS: 1.0000 | ORAL_TABLET | Freq: Every day | ORAL | 3 refills | Status: DC

## 2023-01-08 MED ORDER — BUPROPION HCL ER (XL) 300 MG PO TB24: 300.0000 mg | ORAL_TABLET | Freq: Every day | ORAL | 1 refills | Status: DC

## 2023-01-08 MED ORDER — LEVOTHYROXINE SODIUM 88 MCG PO TABS: 88.0000 ug | ORAL_TABLET | Freq: Every day | ORAL | 1 refills | Status: DC

## 2023-01-08 MED ORDER — WEGOVY 2.4 MG/0.75ML ~~LOC~~ SOAJ: SUBCUTANEOUS | 1 refills | Status: DC

## 2023-01-08 NOTE — Assessment & Plan Note (Signed)
Dr Glennon Mac - 09/2022 - monitor for now - periods. LMP last week.

## 2023-01-08 NOTE — Assessment & Plan Note (Signed)
On thyroid replacement.  Follow tsh.  

## 2023-01-08 NOTE — Assessment & Plan Note (Addendum)
Physical today 01/08/23.  Mammogram 03/18/22 - Birads I.  PAP 06/2020 benign changes/repair- negative with negative HPV.  PAP today. Colonoscopy 12/2020 - normal.  No polyps.  Recommended f/u in 10 years.

## 2023-01-08 NOTE — Assessment & Plan Note (Signed)
History of gastric surgery.  Periods as outlined.  Follow cbc. Recheck today.

## 2023-01-08 NOTE — Progress Notes (Signed)
Subjective:    Patient ID: [name] Nielsen, female    DOB: [birth year], 47 y.o.   MRN: [id number]  Patient here for  Chief Complaint  Patient presents with   Annual Exam    HPI Here for a physical exam.  On wellbutrin.  Increased anxiety recently.  No specific trigger.  Overall increased, but has had "two bad" episodes.  Called EMS with one episode.  Everything checked out ok. Has seen seeing a therapist previously. Has not seen recently.  Discussed the need to follow up with her therapist.  Also discussed psychiatry referral to discuss medications.  She is agreeable.  Has been off blood pressure medication.  Blood pressure doing well.  Not exercising as much, but does stay active.  No chest pain or sob reported.  No abdominal pain.  Bowels stable.  Saw Dr Glennon Mac - evaluation - irregular periods.  Elected to monitor.  LMP last week.    Past Medical History:  Diagnosis Date   Anemia    hx of    Anxiety    COVID-19 virus infection 11/2019   Depression    Frequent headaches    hx of   Heart murmur    Hx: UTI (urinary tract infection)    child, s/p urinary procecure   Hypercholesterolemia    Hypertension    Hypothyroidism    Past Surgical History:  Procedure Laterality Date   CHOLECYSTECTOMY  2012   COLONOSCOPY     > 10 yrs ago- normal    LAPAROSCOPIC GASTRIC SLEEVE RESECTION N/A 07/05/2014   Procedure: LAPAROSCOPIC GASTRIC SLEEVE RESECTION with upper endoscopy;  Surgeon: Kaylyn Lim, MD;  Location: [facility];  Service: General;  Laterality: N/A;   TUBAL LIGATION  1999   WISDOM TOOTH EXTRACTION     age 54    Family History  Problem Relation Age of Onset   Cancer Mother        brain   Diabetes Father    Hypertension Father    Kidney disease Father    Stroke Father    Cancer Father        prostate   Prostate cancer Father    Breast cancer Neg Hx    Colon cancer Neg Hx    Colon polyps Neg Hx    Esophageal cancer Neg Hx    Rectal cancer Neg Hx    Stomach cancer Neg Hx     Social History   Socioeconomic History   Marital status: Single    Spouse name: Not on file   Number of children: Not on file   Years of education: Not on file   Highest education level: Not on file  Occupational History   Not on file  Tobacco Use   Smoking status: Former   Smokeless tobacco: Never  Substance and Sexual Activity   Alcohol use: No    Alcohol/week: 0.0 standard drinks of alcohol   Drug use: No   Sexual activity: Not on file  Other Topics Concern   Not on file  Social History Narrative   Not on file   Social Determinants of Health   Financial Resource Strain: Low Risk  (05/26/2020)   Overall Financial Resource Strain (CARDIA)    Difficulty of Paying Living Expenses: Not hard at all  Food Insecurity: Not on file  Transportation Needs: Not on file  Physical Activity: Sufficiently Active (11/14/2020)   Exercise Vital Sign    Days of Exercise per Week: 6 days    Minutes  of Exercise per Session: 50 min  Stress: No Stress Concern Present (11/14/2020)   [place]    Feeling of Stress : Only a little  Social Connections: Not on file     Review of Systems  Constitutional:  Negative for appetite change and unexpected weight change.  HENT:  Negative for congestion, sinus pressure and sore throat.   Eyes:  Negative for pain and visual disturbance.  Respiratory:  Negative for cough, chest tightness and shortness of breath.   Cardiovascular:  Negative for chest pain and palpitations.  Gastrointestinal:  Negative for abdominal pain, diarrhea, nausea and vomiting.  Genitourinary:  Negative for difficulty urinating and dysuria.  Musculoskeletal:  Negative for joint swelling and myalgias.  Skin:  Negative for color change and rash.  Neurological:  Negative for dizziness and headaches.  Hematological:  Negative for adenopathy. Does not bruise/bleed easily.  Psychiatric/Behavioral:  Negative for agitation  and dysphoric mood.        Increased anxiety as outlined.        Objective:     BP 124/70   Pulse 62   Temp 98 F (36.7 C)   Resp 16   Ht 5\' 7"  (1.702 m)   Wt 176 lb (79.8 kg)   SpO2 99%   BMI 27.57 kg/m  Wt Readings from Last 3 Encounters:  01/08/23 176 lb (79.8 kg)  08/12/22 166 lb 7.4 oz (75.5 kg)  06/03/22 163 lb (73.9 kg)    Physical Exam Vitals reviewed.  Constitutional:      General: She is not in acute distress.    Appearance: Normal appearance. She is well-developed.  HENT:     Head: Normocephalic and atraumatic.     Right Ear: External ear normal.     Left Ear: External ear normal.  Eyes:     General: No scleral icterus.       Right eye: No discharge.        Left eye: No discharge.     Conjunctiva/sclera: Conjunctivae normal.  Neck:     Thyroid: No thyromegaly.  Cardiovascular:     Rate and Rhythm: Normal rate and regular rhythm.  Pulmonary:     Effort: No tachypnea, accessory muscle usage or respiratory distress.     Breath sounds: Normal breath sounds. No decreased breath sounds or wheezing.  Chest:  Breasts:    Right: No inverted nipple, mass, nipple discharge or tenderness (no axillary adenopathy).     Left: No inverted nipple, mass, nipple discharge or tenderness (no axilarry adenopathy).  Abdominal:     General: Bowel sounds are normal.     Palpations: Abdomen is soft.     Tenderness: There is no abdominal tenderness.  Genitourinary:    Comments: Normal external genitalia.  Vaginal vault without lesions.  Cervix identified.  Pap smear performed.  Could not appreciate any adnexal masses or tenderness.   Musculoskeletal:        General: No swelling or tenderness.     Cervical back: Neck supple.  Lymphadenopathy:     Cervical: No cervical adenopathy.  Skin:    Findings: No erythema or rash.  Neurological:     Mental Status: She is alert and oriented to person, place, and time.  Psychiatric:        Mood and Affect: Mood normal.         Behavior: Behavior normal.      Outpatient Encounter Medications as of 01/08/2023  Medication Sig  acetaminophen (TYLENOL) 500 MG tablet Take 1,000 mg by mouth every 6 (six) hours as needed for mild pain or moderate pain.   ALPRAZolam (XANAX) 0.25 MG tablet Take 1 tablet (0.25 mg total) by mouth daily as needed for anxiety.   buPROPion (WELLBUTRIN XL) 300 MG 24 hr tablet Take 1 tablet (300 mg total) by mouth daily.   cholecalciferol (VITAMIN D3) 25 MCG (1000 UNIT) tablet Take 2,000 Units by mouth daily.   Cyanocobalamin (B-12 PO) Take by mouth.   Fe Fum-FePoly-Vit C-Vit B3 (INTEGRA) 62.5-62.5-40-3 MG CAPS Take 1 capsule by mouth daily.   levothyroxine (SYNTHROID) 88 MCG tablet Take 1 tablet (88 mcg total) by mouth daily before breakfast.   MAGnesium-Oxide 400 (240 Mg) MG tablet Take 1 tablet (400 mg total) by mouth daily.   naproxen (NAPROSYN) 500 MG tablet Take 1 tablet (500 mg total) by mouth 2 (two) times daily with a meal.   rosuvastatin (CRESTOR) 10 MG tablet Take 1 tablet (10 mg total) by mouth daily.   Semaglutide-Weight Management (WEGOVY) 2.4 MG/0.75ML SOAJ INJECT 2.4MG  SUBCUTANEOUSLY ONCE A WEEK   [DISCONTINUED] buPROPion (WELLBUTRIN XL) 300 MG 24 hr tablet Take 1 tablet by mouth once daily   [DISCONTINUED] Fe Fum-FePoly-Vit C-Vit B3 (INTEGRA) 62.5-62.5-40-3 MG CAPS Take 1 capsule by mouth once daily   [DISCONTINUED] levothyroxine (SYNTHROID) 88 MCG tablet TAKE 1 TABLET BY MOUTH ONCE DAILY BEFORE BREAKFAST   [DISCONTINUED] MAGnesium-Oxide 400 (240 Mg) MG tablet Take 1 tablet by mouth once daily   [DISCONTINUED] rosuvastatin (CRESTOR) 10 MG tablet Take 1 tablet by mouth once daily   [DISCONTINUED] WEGOVY 2.4 MG/0.75ML SOAJ INJECT 2.4MG  SUBCUTANEOUSLY ONCE A WEEK   No facility-administered encounter medications on file as of 01/08/2023.     Lab Results  Component Value Date   WBC 5.5 08/12/2022   HGB 12.9 08/12/2022   HCT 40.2 08/12/2022   PLT 248 08/12/2022   GLUCOSE 83  08/19/2022   CHOL 183 08/12/2022   TRIG 95 08/12/2022   HDL 87 08/12/2022   LDLDIRECT 178.3 11/11/2013   LDLCALC 79 08/12/2022   ALT 33 09/19/2022   AST 21 09/19/2022   NA 140 08/19/2022   K 4.2 08/19/2022   CL 104 08/19/2022   CREATININE 0.99 08/19/2022   BUN 10 08/19/2022   CO2 30 08/19/2022   TSH 2.500 08/12/2022   HGBA1C 5.2 08/12/2022    US Abdomen Complete  Result Date: 08/15/2022 CLINICAL DATA:  47 year old female with abnormal LFTs and creatinine. History of cholecystectomy. EXAM: ABDOMEN ULTRASOUND COMPLETE COMPARISON:  None Available. FINDINGS: Gallbladder: Not visualized compatible with cholecystectomy. Common bile duct: Diameter: 3 mm. There is no evidence of intrahepatic or extrahepatic biliary dilatation. Liver: No focal lesion identified. Within normal limits in parenchymal echogenicity. Portal vein is patent on color Doppler imaging with normal direction of blood flow towards the liver. IVC: No abnormality visualized. Pancreas: Visualized portion unremarkable. Spleen: Size and appearance within normal limits. Right Kidney: Length: 10.4 cm. Echogenicity within normal limits. No mass or hydronephrosis visualized. Left Kidney: Length: 10.3 cm. Echogenicity within normal limits. No mass or hydronephrosis visualized. Abdominal aorta: No aneurysm visualized. Other findings: None. IMPRESSION: 1. Status post cholecystectomy otherwise unremarkable abdominal ultrasound. Normal appearing liver and kidneys. Electronically Signed   By: Margarette Canada M.D.   On: 08/15/2022 16:40       Assessment & Plan:  Routine general medical examination at a health care facility  Hypercholesterolemia Assessment & Plan: On crestor. Low cholesterol diet and exercise.  Follow lipid panel and liver function tests.    Orders: -     Basic metabolic panel -     Hepatic function panel -     Lipid panel -     Rosuvastatin Calcium; Take 1 tablet (10 mg total) by mouth daily.  Dispense: 90 tablet; Refill:  3  Hyperglycemia Assessment & Plan: Low carb diet and exercise. Follow met b and a1c.    Orders: -     Hemoglobin A1c  Anemia, unspecified type Assessment & Plan: History of gastric surgery.  Periods as outlined.  Follow cbc. Recheck today.   Orders: -     CBC with Differential/Platelet -     Ferritin  Health care maintenance Assessment & Plan: Physical today 01/08/23.  Mammogram 03/18/22 - Birads I.  PAP 06/2020 benign changes/repair- negative with negative HPV.  PAP today. Colonoscopy 12/2020 - normal.  No polyps.  Recommended f/u in 10 years.     Screening for cervical cancer -     Cytology - PAP  BMI 29.0-29.9,adult -     Wegovy; INJECT 2.4MG  SUBCUTANEOUSLY ONCE A WEEK  Dispense: 4 mL; Refill: 1  Cervical cancer screening Assessment & Plan: PAP smear today.    Depression, recurrent Assessment & Plan: On wellbutrin 300mg .  Has seen a therapist previously.  Discussed.  Increased anxiety recently.  Discussed seeing her therapist.  Also discussed referral to psychiatry regarding possible medication changes.  Agreeable.  Follow.   Orders: -     Ambulatory referral to Psychiatry  Essential hypertension, benign Assessment & Plan: Off medication.  Blood pressure has been doing well.  Spot check pressures.  Hold on medication.    Hypothyroidism, unspecified type Assessment & Plan: On thyroid replacement.  Follow tsh.    Irregular periods Assessment & Plan: Dr Glennon Mac - 09/2022 - monitor for now - periods. LMP last week.    Anxiety Assessment & Plan: Increased anxiety as outlined.  Discussed.  Will f/u with therapist and referral to psychiatry as outlined.  Continues on wellbutrin.   Orders: -     Ambulatory referral to Psychiatry  Other orders -     buPROPion HCl ER (XL); Take 1 tablet (300 mg total) by mouth daily.  Dispense: 90 tablet; Refill: 1 -     Integra; Take 1 capsule by mouth daily.  Dispense: 90 capsule; Refill: 3 -     Levothyroxine Sodium; Take 1  tablet (88 mcg total) by mouth daily before breakfast.  Dispense: 90 tablet; Refill: 1 -     Magnesium Oxide -Mg Supplement; Take 1 tablet (400 mg total) by mouth daily.  Dispense: 90 tablet; Refill: 3     Einar Pheasant, MD

## 2023-01-08 NOTE — Assessment & Plan Note (Signed)
Increased anxiety as outlined.  Discussed.  Will f/u with therapist and referral to psychiatry as outlined.  Continues on wellbutrin.

## 2023-01-08 NOTE — Assessment & Plan Note (Signed)
PAP smear today. 

## 2023-01-08 NOTE — Assessment & Plan Note (Signed)
On wellbutrin 300mg .  Has seen a therapist previously.  Discussed.  Increased anxiety recently.  Discussed seeing her therapist.  Also discussed referral to psychiatry regarding possible medication changes.  Agreeable.  Follow.

## 2023-01-08 NOTE — Telephone Encounter (Signed)
I had placed an order this am to Dr Nicolasa Ducking.  [name] Nielsen messaged back and she wants to see [name] Nielsen instead. I canceled the first referral and placed a new one for [name] Nielsen.  Just wanted you to be aware.  Thanks

## 2023-01-08 NOTE — Assessment & Plan Note (Signed)
Low carb diet and exercise.  Follow met b and a1c.   

## 2023-01-08 NOTE — Assessment & Plan Note (Signed)
On crestor.  Low cholesterol diet and exercise.  Follow lipid panel and liver function tests.   

## 2023-01-08 NOTE — Assessment & Plan Note (Signed)
Off medication.  Blood pressure has been doing well.  Spot check pressures.  Hold on medication.

## 2023-01-09 LAB — CYTOLOGY - PAP
Comment: NEGATIVE
Diagnosis: NEGATIVE
High risk HPV: NEGATIVE

## 2023-01-10 ENCOUNTER — Other Ambulatory Visit: Payer: Self-pay | Admitting: *Deleted

## 2023-01-10 DIAGNOSIS — R7989 Other specified abnormal findings of blood chemistry: Secondary | ICD-10-CM

## 2023-01-10 DIAGNOSIS — D72829 Elevated white blood cell count, unspecified: Secondary | ICD-10-CM

## 2023-01-16 ENCOUNTER — Encounter: Payer: Self-pay | Admitting: Internal Medicine

## 2023-01-17 ENCOUNTER — Telehealth: Payer: Self-pay

## 2023-01-17 ENCOUNTER — Other Ambulatory Visit (HOSPITAL_COMMUNITY): Payer: Self-pay

## 2023-01-17 NOTE — Telephone Encounter (Signed)
Pharmacy Patient Advocate Encounter  Received notification from [employer] that the request for prior authorization for [facility] 2.4MG  INJ has been denied due to Plan exclusion.      Please be advised we currently do not have a Pharmacist to review denials, therefore you will need to process appeals accordingly as needed. Thanks for your support at this time.   You may fax [phone] to appeal.

## 2023-01-17 NOTE — Telephone Encounter (Signed)
Pharmacy Patient Advocate Encounter   Received notification that prior authorization for Wegovy 2.4MG /0.75ML auto-injectors is required/requested.  Per Test Claim: Plan exclusion   PA submitted on 01/17/23 to (ins) [employer] via CoverMyMeds Key or (Medicaid) confirmation # [id number] Status is pending

## 2023-01-21 NOTE — Telephone Encounter (Signed)
See my chart message

## 2023-02-07 ENCOUNTER — Other Ambulatory Visit (INDEPENDENT_AMBULATORY_CARE_PROVIDER_SITE_OTHER): Payer: No Typology Code available for payment source

## 2023-02-07 DIAGNOSIS — R7989 Other specified abnormal findings of blood chemistry: Secondary | ICD-10-CM

## 2023-02-07 DIAGNOSIS — D72829 Elevated white blood cell count, unspecified: Secondary | ICD-10-CM

## 2023-02-07 LAB — HEPATIC FUNCTION PANEL
ALT: 68 U/L — ABNORMAL HIGH (ref 0–35)
AST: 36 U/L (ref 0–37)
Albumin: 4.2 g/dL (ref 3.5–5.2)
Alkaline Phosphatase: 46 U/L (ref 39–117)
Bilirubin, Direct: 0.1 mg/dL (ref 0.0–0.3)
Total Bilirubin: 0.5 mg/dL (ref 0.2–1.2)
Total Protein: 7 g/dL (ref 6.0–8.3)

## 2023-02-07 LAB — CBC WITH DIFFERENTIAL/PLATELET
Basophils Absolute: 0.1 10*3/uL (ref 0.0–0.1)
Basophils Relative: 1.1 % (ref 0.0–3.0)
Eosinophils Absolute: 0.1 10*3/uL (ref 0.0–0.7)
Eosinophils Relative: 2.2 % (ref 0.0–5.0)
HCT: 37.1 % (ref 36.0–46.0)
Hemoglobin: 12.3 g/dL (ref 12.0–15.0)
Lymphocytes Relative: 32.5 % (ref 12.0–46.0)
Lymphs Abs: 1.8 10*3/uL (ref 0.7–4.0)
MCHC: 33.1 g/dL (ref 30.0–36.0)
MCV: 76.4 fl — ABNORMAL LOW (ref 78.0–100.0)
Monocytes Absolute: 0.5 10*3/uL (ref 0.1–1.0)
Monocytes Relative: 9.2 % (ref 3.0–12.0)
Neutro Abs: 3 10*3/uL (ref 1.4–7.7)
Neutrophils Relative %: 55 % (ref 43.0–77.0)
Platelets: 268 10*3/uL (ref 150.0–400.0)
RBC: 4.86 Mil/uL (ref 3.87–5.11)
RDW: 13.9 % (ref 11.5–15.5)
WBC: 5.5 10*3/uL (ref 4.0–10.5)

## 2023-02-11 ENCOUNTER — Telehealth: Payer: Self-pay

## 2023-02-11 DIAGNOSIS — E7800 Pure hypercholesterolemia, unspecified: Secondary | ICD-10-CM

## 2023-02-11 DIAGNOSIS — D649 Anemia, unspecified: Secondary | ICD-10-CM

## 2023-02-11 NOTE — Telephone Encounter (Signed)
-----   Message from Dale [place], MD sent at 02/11/2023  4:44 AM EDT ----- Notify - one liver test remains elevated.  Remainder of liver panel wnl.  Recent abdominal ultrasound - liver ok.  We will need to follow.  .  Hgb stable.  Need to confirm if taking any iron.  Recent iron stores - within the normal limits of lab range, but is at the low end.  If taking, document how much taking.  If not taking, I would like to start ferrous sulfate 325mg  q day.  Will follow.  Would like to recheck liver panel in one month.

## 2023-02-11 NOTE — Addendum Note (Signed)
Addended by: Rita Ohara D on: 02/11/2023 01:11 PM   Modules accepted: Orders

## 2023-02-11 NOTE — Telephone Encounter (Signed)
Patient called back and note was read from Dr Lorin Picket. Patient is still taking her prescribed iron medication. Lab was scheduled for next month to check patient's iron., Dr Lorin Picket.

## 2023-02-11 NOTE — Telephone Encounter (Signed)
Add cbc and ferritin - thanks.  Continue integra.

## 2023-02-11 NOTE — Telephone Encounter (Signed)
Patient aware. Labs ordered.  

## 2023-02-11 NOTE — Telephone Encounter (Signed)
Just wanted to confirm that we are only checking liver panel in 4 weeks? Do not need to check iron levels again. Patient is taking integra daily

## 2023-02-12 ENCOUNTER — Encounter: Payer: Self-pay | Admitting: Internal Medicine

## 2023-02-13 ENCOUNTER — Other Ambulatory Visit: Payer: Self-pay

## 2023-02-13 NOTE — Telephone Encounter (Signed)
I took off the wellbutrin and added prozac to her med list. Adderall did not populate in for me to add so will add at next visit. Sending to you for you to see records from ADHD testing.

## 2023-03-10 ENCOUNTER — Other Ambulatory Visit: Payer: Self-pay

## 2023-03-10 DIAGNOSIS — E039 Hypothyroidism, unspecified: Secondary | ICD-10-CM

## 2023-03-10 NOTE — Telephone Encounter (Signed)
Ok to add tsh?

## 2023-03-14 ENCOUNTER — Other Ambulatory Visit (INDEPENDENT_AMBULATORY_CARE_PROVIDER_SITE_OTHER): Payer: No Typology Code available for payment source

## 2023-03-14 DIAGNOSIS — E7800 Pure hypercholesterolemia, unspecified: Secondary | ICD-10-CM

## 2023-03-14 DIAGNOSIS — D649 Anemia, unspecified: Secondary | ICD-10-CM

## 2023-03-14 DIAGNOSIS — E039 Hypothyroidism, unspecified: Secondary | ICD-10-CM

## 2023-03-14 LAB — CBC WITH DIFFERENTIAL/PLATELET
Basophils Absolute: 0.1 10*3/uL (ref 0.0–0.1)
Basophils Relative: 0.9 % (ref 0.0–3.0)
Eosinophils Absolute: 0.1 10*3/uL (ref 0.0–0.7)
Eosinophils Relative: 1.8 % (ref 0.0–5.0)
HCT: 38.9 % (ref 36.0–46.0)
Hemoglobin: 12.5 g/dL (ref 12.0–15.0)
Lymphocytes Relative: 30 % (ref 12.0–46.0)
Lymphs Abs: 1.7 10*3/uL (ref 0.7–4.0)
MCHC: 32.1 g/dL (ref 30.0–36.0)
MCV: 76.3 fl — ABNORMAL LOW (ref 78.0–100.0)
Monocytes Absolute: 0.6 10*3/uL (ref 0.1–1.0)
Monocytes Relative: 9.8 % (ref 3.0–12.0)
Neutro Abs: 3.3 10*3/uL (ref 1.4–7.7)
Neutrophils Relative %: 57.5 % (ref 43.0–77.0)
Platelets: 288 10*3/uL (ref 150.0–400.0)
RBC: 5.09 Mil/uL (ref 3.87–5.11)
RDW: 13.8 % (ref 11.5–15.5)
WBC: 5.8 10*3/uL (ref 4.0–10.5)

## 2023-03-14 LAB — HEPATIC FUNCTION PANEL
ALT: 26 U/L (ref 0–35)
AST: 21 U/L (ref 0–37)
Albumin: 4.2 g/dL (ref 3.5–5.2)
Alkaline Phosphatase: 38 U/L — ABNORMAL LOW (ref 39–117)
Bilirubin, Direct: 0.1 mg/dL (ref 0.0–0.3)
Total Bilirubin: 0.5 mg/dL (ref 0.2–1.2)
Total Protein: 7.1 g/dL (ref 6.0–8.3)

## 2023-03-14 LAB — TSH: TSH: 4.12 u[IU]/mL (ref 0.35–5.50)

## 2023-03-14 LAB — FERRITIN: Ferritin: 14 ng/mL (ref 10.0–291.0)

## 2023-03-17 ENCOUNTER — Other Ambulatory Visit: Payer: Self-pay

## 2023-03-17 DIAGNOSIS — E039 Hypothyroidism, unspecified: Secondary | ICD-10-CM

## 2023-03-17 MED ORDER — LEVOTHYROXINE SODIUM 100 MCG PO TABS: 100.0000 ug | ORAL_TABLET | Freq: Every day | ORAL | 1 refills | Status: DC

## 2023-03-17 NOTE — Telephone Encounter (Signed)
Given that her tsh was at the upper limits of the normal range, I can increase her to q day (from 88).  Recheck tsh in 6-8 weeks.  Confirm taking correctly.

## 2023-04-09 ENCOUNTER — Encounter: Payer: Self-pay | Admitting: Internal Medicine

## 2023-04-09 ENCOUNTER — Ambulatory Visit (INDEPENDENT_AMBULATORY_CARE_PROVIDER_SITE_OTHER): Payer: No Typology Code available for payment source | Admitting: Internal Medicine

## 2023-04-09 VITALS — BP 126/74 | HR 81 | Temp 97.9°F | Resp 16 | Ht 67.0 in | Wt 201.0 lb

## 2023-04-09 DIAGNOSIS — F32A Depression, unspecified: Secondary | ICD-10-CM

## 2023-04-09 DIAGNOSIS — Z713 Dietary counseling and surveillance: Secondary | ICD-10-CM

## 2023-04-09 DIAGNOSIS — F339 Major depressive disorder, recurrent, unspecified: Secondary | ICD-10-CM

## 2023-04-09 DIAGNOSIS — E7800 Pure hypercholesterolemia, unspecified: Secondary | ICD-10-CM | POA: Diagnosis not present

## 2023-04-09 DIAGNOSIS — E039 Hypothyroidism, unspecified: Secondary | ICD-10-CM | POA: Diagnosis not present

## 2023-04-09 DIAGNOSIS — Z1231 Encounter for screening mammogram for malignant neoplasm of breast: Secondary | ICD-10-CM

## 2023-04-09 DIAGNOSIS — I10 Essential (primary) hypertension: Secondary | ICD-10-CM

## 2023-04-09 DIAGNOSIS — R739 Hyperglycemia, unspecified: Secondary | ICD-10-CM | POA: Diagnosis not present

## 2023-04-09 MED ORDER — WEGOVY 0.25 MG/0.5ML ~~LOC~~ SOAJ: 0.2500 mg | SUBCUTANEOUS | 2 refills | Status: DC

## 2023-04-09 NOTE — Progress Notes (Signed)
Subjective:    Patient ID: [name] Nielsen, female    DOB: 07-Aug-1976, 47 y.o.   MRN: [id number]  Patient here for  Chief Complaint  Patient presents with   Medical Management of Chronic Issues    HPI Here to follow up regarding increased anxiety.  Has been on wellbutrin.  Recently evaluated by psychiatry.  Wellbutrin changed to prozac.  Feels much better on prozac.  Is concerned regarding weight gain.  More stiff.  Feels she is retaining fluid.  Also more emotional.  Wants to restart wegovy.  Has been off for months.  Tries to stay active.  No chest pain or sob reported.  No cough or congestion.  Blood pressure ok.  Previously reported irregular periods.  Saw gyn.  Elected to monitor symptoms.    Past Medical History:  Diagnosis Date   Anemia    hx of    Anxiety    COVID-19 virus infection 11/2019   Depression    Frequent headaches    hx of   Heart murmur    Hx: UTI (urinary tract infection)    child, s/p urinary procecure   Hypercholesterolemia    Hypertension    Hypothyroidism    Past Surgical History:  Procedure Laterality Date   CHOLECYSTECTOMY  2012   COLONOSCOPY     > 10 yrs ago- normal    LAPAROSCOPIC GASTRIC SLEEVE RESECTION N/A 07/05/2014   Procedure: LAPAROSCOPIC GASTRIC SLEEVE RESECTION with upper endoscopy;  Surgeon: Wenda Low, MD;  Location: [facility];  Service: General;  Laterality: N/A;   TUBAL LIGATION  1999   WISDOM TOOTH EXTRACTION     age 10    Family History  Problem Relation Age of Onset   Cancer Mother        brain   Diabetes Father    Hypertension Father    Kidney disease Father    Stroke Father    Cancer Father        prostate   Prostate cancer Father    Breast cancer Neg Hx    Colon cancer Neg Hx    Colon polyps Neg Hx    Esophageal cancer Neg Hx    Rectal cancer Neg Hx    Stomach cancer Neg Hx    Social History   Socioeconomic History   Marital status: Single    Spouse name: Not on file   Number of children: Not on file    Years of education: Not on file   Highest education level: Master's degree (e.g., MA, MS, MEng, MEd, MSW, MBA)  Occupational History   Not on file  Tobacco Use   Smoking status: Former   Smokeless tobacco: Never  Substance and Sexual Activity   Alcohol use: No    Alcohol/week: 0.0 standard drinks of alcohol   Drug use: No   Sexual activity: Not on file  Other Topics Concern   Not on file  Social History Narrative   Not on file   Social Determinants of Health   Financial Resource Strain: Low Risk  (04/06/2023)   Overall Financial Resource Strain (CARDIA)    Difficulty of Paying Living Expenses: Not very hard  Food Insecurity: No Food Insecurity (04/06/2023)   Hunger Vital Sign    Worried About Running Out of Food in the Last Year: Never true    Ran Out of Food in the Last Year: Never true  Transportation Needs: No Transportation Needs (04/06/2023)   PRAPARE - Transportation    Lack of  Transportation (Medical): No    Lack of Transportation (Non-Medical): No  Physical Activity: Insufficiently Active (04/06/2023)   Exercise Vital Sign    Days of Exercise per Week: 1 day    Minutes of Exercise per Session: 20 min  Stress: No Stress Concern Present (04/06/2023)   [employer] of Occupational Health - Occupational Stress Questionnaire    Feeling of Stress : Not at all  Social Connections: Moderately Isolated (04/06/2023)   Social Connection and Isolation Panel [NHANES]    Frequency of Communication with Friends and Family: More than three times a week    Frequency of Social Gatherings with Friends and Family: Once a week    Attends Religious Services: Never    Database administrator or Organizations: Yes    Attends Engineer, structural: More than 4 times per year    Marital Status: Separated     Review of Systems  Constitutional:  Negative for appetite change.       Concerned regarding weight gain.   HENT:  Negative for congestion and sinus pressure.    Respiratory:  Negative for cough, chest tightness and shortness of breath.   Cardiovascular:  Negative for chest pain, palpitations and leg swelling.  Gastrointestinal:  Negative for abdominal pain, diarrhea, nausea and vomiting.  Genitourinary:  Negative for difficulty urinating and dysuria.  Musculoskeletal:  Negative for joint swelling and myalgias.  Skin:  Negative for color change and rash.  Neurological:  Negative for dizziness and headaches.  Psychiatric/Behavioral:  Negative for agitation and dysphoric mood.        Does report feeling better on prozac as outlined.        Objective:     BP 126/74   Pulse 81   Temp 97.9 F (36.6 C)   Resp 16   Ht 5\' 7"  (1.702 m)   Wt 201 lb (91.2 kg)   SpO2 98%   BMI 31.48 kg/m  Wt Readings from Last 3 Encounters:  04/09/23 201 lb (91.2 kg)  01/08/23 176 lb (79.8 kg)  08/12/22 166 lb 7.4 oz (75.5 kg)    Physical Exam Vitals reviewed.  Constitutional:      General: She is not in acute distress.    Appearance: Normal appearance.  HENT:     Head: Normocephalic and atraumatic.     Right Ear: External ear normal.     Left Ear: External ear normal.  Eyes:     General: No scleral icterus.       Right eye: No discharge.        Left eye: No discharge.     Conjunctiva/sclera: Conjunctivae normal.  Neck:     Thyroid: No thyromegaly.  Cardiovascular:     Rate and Rhythm: Normal rate and regular rhythm.  Pulmonary:     Effort: No respiratory distress.     Breath sounds: Normal breath sounds. No wheezing.  Abdominal:     General: Bowel sounds are normal.     Palpations: Abdomen is soft.     Tenderness: There is no abdominal tenderness.  Musculoskeletal:        General: No swelling or tenderness.     Cervical back: Neck supple. No tenderness.  Lymphadenopathy:     Cervical: No cervical adenopathy.  Skin:    Findings: No erythema or rash.  Neurological:     Mental Status: She is alert.  Psychiatric:        Mood and Affect:  Mood normal.  Behavior: Behavior normal.      Outpatient Encounter Medications as of 04/09/2023  Medication Sig   methylphenidate 36 MG PO CR tablet Take 36 mg by mouth every morning.   Semaglutide-Weight Management (WEGOVY) 0.25 MG/0.5ML SOAJ Inject 0.25 mg into the skin once a week.   acetaminophen (TYLENOL) 500 MG tablet Take 1,000 mg by mouth every 6 (six) hours as needed for mild pain or moderate pain.   ALPRAZolam (XANAX) 0.25 MG tablet Take 1 tablet (0.25 mg total) by mouth daily as needed for anxiety.   cholecalciferol (VITAMIN D3) 25 MCG (1000 UNIT) tablet Take 2,000 Units by mouth daily.   Cyanocobalamin (B-12 PO) Take by mouth.   Fe Fum-FePoly-Vit C-Vit B3 (INTEGRA) 62.5-62.5-40-3 MG CAPS Take 1 capsule by mouth daily.   FLUoxetine (PROZAC) 20 MG capsule Take 20 mg by mouth daily.   levothyroxine (SYNTHROID) 100 MCG tablet Take 1 tablet (100 mcg total) by mouth daily before breakfast.   MAGnesium-Oxide 400 (240 Mg) MG tablet Take 1 tablet (400 mg total) by mouth daily.   rosuvastatin (CRESTOR) 10 MG tablet Take 1 tablet (10 mg total) by mouth daily.   [DISCONTINUED] naproxen (NAPROSYN) 500 MG tablet Take 1 tablet (500 mg total) by mouth 2 (two) times daily with a meal.   [DISCONTINUED] Semaglutide-Weight Management (WEGOVY) 2.4 MG/0.75ML SOAJ INJECT 2.4MG  SUBCUTANEOUSLY ONCE A WEEK   No facility-administered encounter medications on file as of 04/09/2023.     Lab Results  Component Value Date   WBC 5.8 03/14/2023   HGB 12.5 03/14/2023   HCT 38.9 03/14/2023   PLT 288.0 03/14/2023   GLUCOSE 82 01/08/2023   CHOL 170 01/08/2023   TRIG 59.0 01/08/2023   HDL 77.30 01/08/2023   LDLDIRECT 178.3 11/11/2013   LDLCALC 80 01/08/2023   ALT 26 03/14/2023   AST 21 03/14/2023   NA 139 01/08/2023   K 4.3 01/08/2023   CL 106 01/08/2023   CREATININE 1.05 01/08/2023   BUN 8 01/08/2023   CO2 28 01/08/2023   TSH 4.12 03/14/2023   HGBA1C 5.4 01/08/2023    No results found.      Assessment & Plan:  Visit for screening mammogram -     3D Screening Mammogram, Left and Right; Future  Hypothyroidism, unspecified type Assessment & Plan: On thyroid replacement.  Follow tsh.    Mild depression Assessment & Plan: Seeing psychiatry.  Wellbutrin changed to prozac.  Feels better on prozac.  Follow.    Hyperglycemia Assessment & Plan: Low carb diet and exercise. Follow met b and a1c.     Hypercholesterolemia Assessment & Plan: On crestor. Low cholesterol diet and exercise.  Follow lipid panel and liver function tests.     Essential hypertension, benign Assessment & Plan: Off medication.  Blood pressure has been doing well.  Spot check pressures.  Hold on medication.    Depression, recurrent [facility]) Assessment & Plan: Seeing psychiatry.  On prozac now.  Off wellbutrin.  Doing better.  Continue f/u with psychiatry.    Weight loss counseling, encounter for Assessment & Plan: Discussed diet and exercise.  Wants to restart wegovy.  Rx sent in for .25mg .  Follow.    Other orders -     Wegovy; Inject 0.25 mg into the skin once a week.  Dispense: 2 mL; Refill: 2     Dale Ceres, MD

## 2023-04-13 ENCOUNTER — Encounter: Payer: Self-pay | Admitting: Internal Medicine

## 2023-04-13 DIAGNOSIS — Z713 Dietary counseling and surveillance: Secondary | ICD-10-CM | POA: Insufficient documentation

## 2023-04-13 NOTE — Assessment & Plan Note (Signed)
Seeing psychiatry.  On prozac now.  Off wellbutrin.  Doing better.  Continue f/u with psychiatry.

## 2023-04-13 NOTE — Assessment & Plan Note (Signed)
Discussed diet and exercise.  Wants to restart wegovy.  Rx sent in for .25mg .  Follow.

## 2023-04-13 NOTE — Assessment & Plan Note (Signed)
Low carb diet and exercise.  Follow met b and a1c.   

## 2023-04-13 NOTE — Assessment & Plan Note (Signed)
Off medication.  Blood pressure has been doing well.  Spot check pressures.  Hold on medication.  

## 2023-04-13 NOTE — Assessment & Plan Note (Signed)
On crestor.  Low cholesterol diet and exercise.  Follow lipid panel and liver function tests.   

## 2023-04-13 NOTE — Assessment & Plan Note (Signed)
On thyroid replacement.  Follow tsh.  

## 2023-04-13 NOTE — Assessment & Plan Note (Signed)
Seeing psychiatry.  Wellbutrin changed to prozac.  Feels better on prozac.  Follow.

## 2023-04-14 ENCOUNTER — Telehealth: Payer: Self-pay

## 2023-04-14 NOTE — Telephone Encounter (Signed)
PA for wegovy needed.  

## 2023-04-16 ENCOUNTER — Other Ambulatory Visit (HOSPITAL_COMMUNITY): Payer: Self-pay

## 2023-04-18 ENCOUNTER — Telehealth: Payer: Self-pay

## 2023-04-18 ENCOUNTER — Other Ambulatory Visit (HOSPITAL_COMMUNITY): Payer: Self-pay

## 2023-04-18 NOTE — Telephone Encounter (Signed)
Pharmacy Patient Advocate Encounter   Received notification from Patient [employer]s that prior authorization for Wegovy 0.25MG /0.5ML auto-injectors is required/requested.   Insurance verification completed.   The patient is insured through [facility] .    PA started via CoverMyMeds. KEY [id number] . Waiting for clinical questions to populate.

## 2023-04-18 NOTE — Telephone Encounter (Signed)
See mychart msg

## 2023-04-18 NOTE — Telephone Encounter (Signed)
See mychart.  

## 2023-04-18 NOTE — Telephone Encounter (Signed)
PA request has been Submitted. New Encounter created for follow up. For additional info see Pharmacy Prior Auth telephone encounter from 04/18/2023.

## 2023-04-21 NOTE — Telephone Encounter (Signed)
Pharmacy Patient Advocate Encounter  Received notification from [employer] that Prior Authorization for [facility] 0.25mg  injection has been DENIED because  .Marland Kitchen  PA #/Case ID/Reference #: [id number]    Please be advised we currently do not have a Pharmacist to review denials, therefore you will need to process appeals accordingly as needed. Thanks for your support at this time.

## 2023-04-22 NOTE — Telephone Encounter (Signed)
Pt aware.

## 2023-04-22 NOTE — Telephone Encounter (Signed)
Insurance denied wegovy again. She is asking for something cheaper that she could pay out of pocket for? Zepbound?

## 2023-04-22 NOTE — Telephone Encounter (Signed)
I am ok to send in rx for zepbound 2.5mg , but I am not sure how much insurance will cover.

## 2023-04-23 MED ORDER — ZEPBOUND 2.5 MG/0.5ML ~~LOC~~ SOAJ: 2.5000 mg | SUBCUTANEOUS | 0 refills | Status: DC

## 2023-04-30 ENCOUNTER — Other Ambulatory Visit (INDEPENDENT_AMBULATORY_CARE_PROVIDER_SITE_OTHER): Payer: No Typology Code available for payment source

## 2023-04-30 DIAGNOSIS — E039 Hypothyroidism, unspecified: Secondary | ICD-10-CM | POA: Diagnosis not present

## 2023-04-30 LAB — TSH: TSH: 3.63 u[IU]/mL (ref 0.35–5.50)

## 2023-05-11 ENCOUNTER — Other Ambulatory Visit: Payer: Self-pay | Admitting: Internal Medicine

## 2023-05-23 ENCOUNTER — Encounter: Payer: Self-pay | Admitting: Internal Medicine

## 2023-05-23 MED ORDER — ZEPBOUND 5 MG/0.5ML ~~LOC~~ SOAJ: 5.0000 mg | SUBCUTANEOUS | 0 refills | Status: DC

## 2023-05-23 NOTE — Telephone Encounter (Signed)
If taking 2.5mg  zepbound weekly and tolerating, ok to increase to 5mg 

## 2023-05-23 NOTE — Telephone Encounter (Signed)
Ok to increase her zepbound?

## 2023-06-18 ENCOUNTER — Encounter: Payer: Self-pay | Admitting: Internal Medicine

## 2023-06-26 ENCOUNTER — Encounter: Payer: Self-pay | Admitting: Internal Medicine

## 2023-06-26 NOTE — Telephone Encounter (Signed)
Please document weight now.  If doing ok on 5mg  ok to increase to 7.5mg .  make sure has f/u appt scheduled.

## 2023-06-26 NOTE — Telephone Encounter (Signed)
Currently on 5 mg. Ok to increase dose? Per patient, she is maintaining but not losing anything. Tolerating well.

## 2023-06-27 MED ORDER — TIRZEPATIDE-WEIGHT MANAGEMENT 7.5 MG/0.5ML ~~LOC~~ SOAJ: 7.5000 mg | SUBCUTANEOUS | 0 refills | Status: DC

## 2023-07-03 ENCOUNTER — Ambulatory Visit
Admission: RE | Admit: 2023-07-03 | Discharge: 2023-07-03 | Disposition: A | Discharge status: Home / Self Care | Payer: No Typology Code available for payment source | Source: Ambulatory Visit | Attending: Internal Medicine | Admitting: Internal Medicine

## 2023-07-03 DIAGNOSIS — Z1231 Encounter for screening mammogram for malignant neoplasm of breast: Secondary | ICD-10-CM | POA: Diagnosis present

## 2023-07-08 ENCOUNTER — Other Ambulatory Visit (INDEPENDENT_AMBULATORY_CARE_PROVIDER_SITE_OTHER): Payer: No Typology Code available for payment source

## 2023-07-08 ENCOUNTER — Telehealth: Payer: Self-pay | Admitting: Internal Medicine

## 2023-07-08 DIAGNOSIS — R739 Hyperglycemia, unspecified: Secondary | ICD-10-CM

## 2023-07-08 DIAGNOSIS — E7800 Pure hypercholesterolemia, unspecified: Secondary | ICD-10-CM

## 2023-07-08 LAB — LIPID PANEL
Cholesterol: 159 mg/dL (ref 0–200)
HDL: 63.8 mg/dL (ref >39.00)
LDL Cholesterol: 81 mg/dL (ref 0–99)
NonHDL: 94.98
Total CHOL/HDL Ratio: 2
Triglycerides: 72 mg/dL (ref 0.0–149.0)
VLDL: 14.4 mg/dL (ref 0.0–40.0)

## 2023-07-08 LAB — CBC WITH DIFFERENTIAL/PLATELET
Basophils Absolute: 0.1 10*3/uL (ref 0.0–0.1)
Basophils Relative: 0.8 % (ref 0.0–3.0)
Eosinophils Absolute: 0.2 10*3/uL (ref 0.0–0.7)
Eosinophils Relative: 2.5 % (ref 0.0–5.0)
HCT: 40.4 % (ref 36.0–46.0)
Hemoglobin: 13 g/dL (ref 12.0–15.0)
Lymphocytes Relative: 26.4 % (ref 12.0–46.0)
Lymphs Abs: 1.9 10*3/uL (ref 0.7–4.0)
MCHC: 32.1 g/dL (ref 30.0–36.0)
MCV: 76.8 fL — ABNORMAL LOW (ref 78.0–100.0)
Monocytes Absolute: 0.6 10*3/uL (ref 0.1–1.0)
Monocytes Relative: 8.7 % (ref 3.0–12.0)
Neutro Abs: 4.5 10*3/uL (ref 1.4–7.7)
Neutrophils Relative %: 61.6 % (ref 43.0–77.0)
Platelets: 302 10*3/uL (ref 150.0–400.0)
RBC: 5.26 Mil/uL — ABNORMAL HIGH (ref 3.87–5.11)
RDW: 14.4 % (ref 11.5–15.5)
WBC: 7.4 10*3/uL (ref 4.0–10.5)

## 2023-07-08 LAB — HEMOGLOBIN A1C: Hgb A1c MFr Bld: 5.5 % (ref 4.6–6.5)

## 2023-07-08 LAB — HEPATIC FUNCTION PANEL
ALT: 25 U/L (ref 0–35)
AST: 20 U/L (ref 0–37)
Albumin: 4.2 g/dL (ref 3.5–5.2)
Alkaline Phosphatase: 39 U/L (ref 39–117)
Bilirubin, Direct: 0.1 mg/dL (ref 0.0–0.3)
Total Bilirubin: 0.4 mg/dL (ref 0.2–1.2)
Total Protein: 7.2 g/dL (ref 6.0–8.3)

## 2023-07-08 LAB — BASIC METABOLIC PANEL
BUN: 8 mg/dL (ref 6–23)
CO2: 27 meq/L (ref 19–32)
Calcium: 9.2 mg/dL (ref 8.4–10.5)
Chloride: 104 meq/L (ref 96–112)
Creatinine, Ser: 0.95 mg/dL (ref 0.40–1.20)
GFR: 70.26 mL/min (ref >60.00)
Glucose, Bld: 82 mg/dL (ref 70–99)
Potassium: 4.1 meq/L (ref 3.5–5.1)
Sodium: 137 meq/L (ref 135–145)

## 2023-07-08 NOTE — Telephone Encounter (Signed)
Patient need lab orders.

## 2023-07-10 ENCOUNTER — Ambulatory Visit (INDEPENDENT_AMBULATORY_CARE_PROVIDER_SITE_OTHER): Payer: No Typology Code available for payment source | Admitting: Internal Medicine

## 2023-07-10 VITALS — BP 108/70 | HR 89 | Temp 97.9°F | Resp 16 | Ht 67.0 in | Wt 210.4 lb

## 2023-07-10 DIAGNOSIS — Z713 Dietary counseling and surveillance: Secondary | ICD-10-CM

## 2023-07-10 DIAGNOSIS — I10 Essential (primary) hypertension: Secondary | ICD-10-CM

## 2023-07-10 DIAGNOSIS — F419 Anxiety disorder, unspecified: Secondary | ICD-10-CM | POA: Diagnosis not present

## 2023-07-10 DIAGNOSIS — R739 Hyperglycemia, unspecified: Secondary | ICD-10-CM

## 2023-07-10 DIAGNOSIS — E7800 Pure hypercholesterolemia, unspecified: Secondary | ICD-10-CM | POA: Diagnosis not present

## 2023-07-10 DIAGNOSIS — D649 Anemia, unspecified: Secondary | ICD-10-CM

## 2023-07-10 DIAGNOSIS — E039 Hypothyroidism, unspecified: Secondary | ICD-10-CM

## 2023-07-10 DIAGNOSIS — F339 Major depressive disorder, recurrent, unspecified: Secondary | ICD-10-CM

## 2023-07-10 NOTE — Progress Notes (Signed)
Subjective:    Patient ID: [name] Nielsen, female    DOB: [birth year], 47 y.o.   MRN: [id number]  Patient here for  Chief Complaint  Patient presents with   Medical Management of Chronic Issues    HPI Here to follow up regarding increased anxiety. Has been on wellbutrin. Followed by psychiatry. Wellbutrin changed to prozac. On the prozac, started feeling worse. Did not feel like doing anything.  Prozac stopped and she is now on avelity.  Has been on this medication for a few weeks.  Is starting to feel some better. Off medication for her blood pressure. Currently on zepbound. Tolerating. Currently on 7.5mg  dose. Some minimal constipation, but overall controlling.  No chest pain or sob reported.  No vomiting.  Work is going well.  Discussed labs.    Past Medical History:  Diagnosis Date   Anemia    hx of    Anxiety    COVID-19 virus infection 11/2019   Depression    Frequent headaches    hx of   Heart murmur    Hx: UTI (urinary tract infection)    child, s/p urinary procecure   Hypercholesterolemia    Hypertension    Hypothyroidism    Past Surgical History:  Procedure Laterality Date   CHOLECYSTECTOMY  2012   COLONOSCOPY     > 10 yrs ago- normal    LAPAROSCOPIC GASTRIC SLEEVE RESECTION N/A 07/05/2014   Procedure: LAPAROSCOPIC GASTRIC SLEEVE RESECTION with upper endoscopy;  Surgeon: Wenda Low, MD;  Location: [facility];  Service: General;  Laterality: N/A;   TUBAL LIGATION  1999   WISDOM TOOTH EXTRACTION     age 59    Family History  Problem Relation Age of Onset   Cancer Mother        brain   Diabetes Father    Hypertension Father    Kidney disease Father    Stroke Father    Cancer Father        prostate   Prostate cancer Father    Breast cancer Neg Hx    Colon cancer Neg Hx    Colon polyps Neg Hx    Esophageal cancer Neg Hx    Rectal cancer Neg Hx    Stomach cancer Neg Hx    Social History   Socioeconomic History   Marital status: Single    Spouse name:  Not on file   Number of children: Not on file   Years of education: Not on file   Highest education level: Master's degree (e.g., MA, MS, MEng, MEd, MSW, MBA)  Occupational History   Not on file  Tobacco Use   Smoking status: Former   Smokeless tobacco: Never  Substance and Sexual Activity   Alcohol use: No    Alcohol/week: 0.0 standard drinks of alcohol   Drug use: No   Sexual activity: Not on file  Other Topics Concern   Not on file  Social History Narrative   Not on file   Social Determinants of Health   Financial Resource Strain: Low Risk  (04/06/2023)   Overall Financial Resource Strain (CARDIA)    Difficulty of Paying Living Expenses: Not very hard  Food Insecurity: No Food Insecurity (04/06/2023)   Hunger Vital Sign    Worried About Running Out of Food in the Last Year: Never true    Ran Out of Food in the Last Year: Never true  Transportation Needs: No Transportation Needs (04/06/2023)   PRAPARE - Transportation  Lack of Transportation (Medical): No    Lack of Transportation (Non-Medical): No  Physical Activity: Insufficiently Active (04/06/2023)   Exercise Vital Sign    Days of Exercise per Week: 1 day    Minutes of Exercise per Session: 20 min  Stress: No Stress Concern Present (04/06/2023)   [employer] of Occupational Health - Occupational Stress Questionnaire    Feeling of Stress : Not at all  Social Connections: Moderately Isolated (04/06/2023)   Social Connection and Isolation Panel [NHANES]    Frequency of Communication with Friends and Family: More than three times a week    Frequency of Social Gatherings with Friends and Family: Once a week    Attends Religious Services: Never    Database administrator or Organizations: Yes    Attends Engineer, structural: More than 4 times per year    Marital Status: Separated     Review of Systems  Constitutional:  Negative for appetite change and unexpected weight change.  HENT:  Negative for  congestion and sinus pressure.   Respiratory:  Negative for cough, chest tightness and shortness of breath.   Cardiovascular:  Negative for chest pain, palpitations and leg swelling.  Gastrointestinal:  Negative for abdominal pain, diarrhea, nausea and vomiting.  Genitourinary:  Negative for difficulty urinating and dysuria.  Musculoskeletal:  Negative for joint swelling and myalgias.  Skin:  Negative for color change and rash.  Neurological:  Negative for dizziness and headaches.  Psychiatric/Behavioral:  Negative for agitation and dysphoric mood.        Objective:     BP 108/70   Pulse 89   Temp 97.9 F (36.6 C)   Resp 16   Ht 5\' 7"  (1.702 m)   Wt 210 lb 6.4 oz (95.4 kg)   LMP 06/06/2023   SpO2 99%   BMI 32.95 kg/m  Wt Readings from Last 3 Encounters:  07/10/23 210 lb 6.4 oz (95.4 kg)  04/09/23 201 lb (91.2 kg)  01/08/23 176 lb (79.8 kg)    Physical Exam Vitals reviewed.  Constitutional:      General: She is not in acute distress.    Appearance: Normal appearance.  HENT:     Head: Normocephalic and atraumatic.     Right Ear: External ear normal.     Left Ear: External ear normal.  Eyes:     General: No scleral icterus.       Right eye: No discharge.        Left eye: No discharge.     Conjunctiva/sclera: Conjunctivae normal.  Neck:     Thyroid: No thyromegaly.  Cardiovascular:     Rate and Rhythm: Normal rate and regular rhythm.  Pulmonary:     Effort: No respiratory distress.     Breath sounds: Normal breath sounds. No wheezing.  Abdominal:     General: Bowel sounds are normal.     Palpations: Abdomen is soft.     Tenderness: There is no abdominal tenderness.  Musculoskeletal:        General: No swelling or tenderness.     Cervical back: Neck supple. No tenderness.  Lymphadenopathy:     Cervical: No cervical adenopathy.  Skin:    Findings: No erythema or rash.  Neurological:     Mental Status: She is alert.  Psychiatric:        Mood and Affect:  Mood normal.        Behavior: Behavior normal.      Outpatient Encounter Medications  as of 07/10/2023  Medication Sig   AUVELITY 45-105 MG TBCR Take 1 tablet by mouth daily.   acetaminophen (TYLENOL) 500 MG tablet Take 1,000 mg by mouth every 6 (six) hours as needed for mild pain or moderate pain.   ALPRAZolam (XANAX) 0.25 MG tablet Take 1 tablet (0.25 mg total) by mouth daily as needed for anxiety.   cholecalciferol (VITAMIN D3) 25 MCG (1000 UNIT) tablet Take 2,000 Units by mouth daily.   Cyanocobalamin (B-12 PO) Take by mouth.   Fe Fum-FePoly-Vit C-Vit B3 (INTEGRA) 62.5-62.5-40-3 MG CAPS Take 1 capsule by mouth daily.   levothyroxine (SYNTHROID) 100 MCG tablet TAKE 1 TABLET BY MOUTH ONCE DAILY BEFORE BREAKFAST   MAGnesium-Oxide 400 (240 Mg) MG tablet Take 1 tablet (400 mg total) by mouth daily.   methylphenidate 36 MG PO CR tablet Take 36 mg by mouth every morning.   rosuvastatin (CRESTOR) 10 MG tablet Take 1 tablet (10 mg total) by mouth daily.   tirzepatide (ZEPBOUND) 7.5 MG/0.5ML Pen Inject 7.5 mg into the skin once a week.   [DISCONTINUED] FLUoxetine (PROZAC) 20 MG capsule Take 20 mg by mouth daily.   No facility-administered encounter medications on file as of 07/10/2023.     Lab Results  Component Value Date   WBC 7.4 07/08/2023   HGB 13.0 07/08/2023   HCT 40.4 07/08/2023   PLT 302.0 07/08/2023   GLUCOSE 82 07/08/2023   CHOL 159 07/08/2023   TRIG 72.0 07/08/2023   HDL 63.80 07/08/2023   LDLDIRECT 178.3 11/11/2013   LDLCALC 81 07/08/2023   ALT 25 07/08/2023   AST 20 07/08/2023   NA 137 07/08/2023   K 4.1 07/08/2023   CL 104 07/08/2023   CREATININE 0.95 07/08/2023   BUN 8 07/08/2023   CO2 27 07/08/2023   TSH 3.63 04/30/2023   HGBA1C 5.5 07/08/2023    MM 3D SCREENING MAMMOGRAM BILATERAL BREAST  Result Date: 07/04/2023 CLINICAL DATA:  Screening. EXAM: DIGITAL SCREENING BILATERAL MAMMOGRAM WITH TOMOSYNTHESIS AND CAD TECHNIQUE: Bilateral screening digital  craniocaudal and mediolateral oblique mammograms were obtained. Bilateral screening digital breast tomosynthesis was performed. The images were evaluated with computer-aided detection. COMPARISON:  Previous exam(s). ACR Breast Density Category b: There are scattered areas of fibroglandular density. FINDINGS: There are no findings suspicious for malignancy. IMPRESSION: No mammographic evidence of malignancy. A result letter of this screening mammogram will be mailed directly to the patient. RECOMMENDATION: Screening mammogram in one year. (Code:SM-B-01Y) BI-RADS CATEGORY  1: Negative. Electronically Signed   By: Baird Lyons M.D.   On: 07/04/2023 12:39       Assessment & Plan:  Hypercholesterolemia Assessment & Plan: The 10-year ASCVD risk score (Arnett DK, et al., 2019) is: 0.5%   Values used to calculate the score:     Age: 16 years     Sex: Female     Is Non-Hispanic African American: No     Diabetic: No     Tobacco smoker: No     Systolic Blood Pressure: 108 mmHg     Is BP treated: No     HDL Cholesterol: 63.8 mg/dL     Total Cholesterol: 159 mg/dL  Low cholesterol diet and exercise.  Follow lipid panel.   Orders: -     Lipid panel; Future -     Hepatic function panel; Future -     Basic metabolic panel; Future  Hyperglycemia Assessment & Plan: Low carb diet and exercise. Follow met b and a1c.    Orders: -  Hemoglobin A1c; Future  Anemia, unspecified type Assessment & Plan: History of gastric surgery.  Periods as outlined.  Follow cbc.   Orders: -     CBC with Differential/Platelet; Future -     Ferritin; Future  Anxiety Assessment & Plan: Was previously on wellbutrin. Followed by psychiatry. Wellbutrin changed to prozac. On the prozac, started feeling worse. Did not feel like doing anything.  Prozac stopped and she is now on avelity.  Has been on this medication for a few weeks.  Is starting to feel some better   Depression, recurrent [facility]) Assessment & Plan: Was  previously on wellbutrin. Followed by psychiatry. Wellbutrin changed to prozac. On the prozac, started feeling worse. Did not feel like doing anything.  Prozac stopped and she is now on avelity.  Has been on this medication for a few weeks.  Is starting to feel some better   Essential hypertension, benign Assessment & Plan: Off medication.  Blood pressure has been doing well.  Spot check pressures.  Hold on medication.    Hypothyroidism, unspecified type Assessment & Plan: On thyroid replacement.  Follow tsh.    Weight loss counseling, encounter for Assessment & Plan: On zepbound.  Tolerating.  Follow.       Dale Peck, MD

## 2023-07-10 NOTE — Assessment & Plan Note (Addendum)
The 10-year ASCVD risk score (Arnett DK, et al., 2019) is: 0.5%   Values used to calculate the score:     Age: 47 years     Sex: Female     Is Non-Hispanic African American: No     Diabetic: No     Tobacco smoker: No     Systolic Blood Pressure: 108 mmHg     Is BP treated: No     HDL Cholesterol: 63.8 mg/dL     Total Cholesterol: 159 mg/dL  Low cholesterol diet and exercise.  Follow lipid panel.

## 2023-07-13 ENCOUNTER — Encounter: Payer: Self-pay | Admitting: Internal Medicine

## 2023-07-13 NOTE — Assessment & Plan Note (Signed)
Low carb diet and exercise.  Follow met b and a1c.  

## 2023-07-13 NOTE — Assessment & Plan Note (Signed)
Off medication.  Blood pressure has been doing well.  Spot check pressures.  Hold on medication.

## 2023-07-13 NOTE — Assessment & Plan Note (Signed)
Was previously on wellbutrin. Followed by psychiatry. Wellbutrin changed to prozac. On the prozac, started feeling worse. Did not feel like doing anything.  Prozac stopped and she is now on avelity.  Has been on this medication for a few weeks.  Is starting to feel some better

## 2023-07-13 NOTE — Assessment & Plan Note (Signed)
History of gastric surgery.  Periods as outlined.  Follow cbc.  

## 2023-07-13 NOTE — Assessment & Plan Note (Signed)
On thyroid replacement.  Follow tsh.  

## 2023-07-13 NOTE — Assessment & Plan Note (Signed)
On zepbound.  Tolerating.  Follow.

## 2023-08-04 ENCOUNTER — Encounter: Payer: Self-pay | Admitting: Internal Medicine

## 2023-08-05 MED ORDER — ZEPBOUND 10 MG/0.5ML ~~LOC~~ SOAJ: 10.0000 mg | SUBCUTANEOUS | 2 refills | Status: DC

## 2023-08-05 NOTE — Telephone Encounter (Signed)
Rx sent in for zepbound 10mg .

## 2023-08-05 NOTE — Telephone Encounter (Signed)
Pt requesting ZepBound 10mg . Asked pt to verify did well with previous dose.   10mg  has been pended

## 2023-08-25 ENCOUNTER — Other Ambulatory Visit (HOSPITAL_COMMUNITY): Payer: Self-pay

## 2023-08-25 MED ORDER — LISDEXAMFETAMINE DIMESYLATE 30 MG PO CAPS
30.0000 mg | ORAL_CAPSULE | Freq: Every day | ORAL | 0 refills | Status: DC
  Filled 2023-08-25: qty 30, 30d supply, fill #0

## 2023-08-26 ENCOUNTER — Encounter: Payer: Self-pay | Admitting: Internal Medicine

## 2023-08-26 NOTE — Telephone Encounter (Signed)
Patient would prefer to try OTC suggestions first and then if does not improve- agreed to be seen. She does have some covid tests at home. Will test to see if positive, unaware of exposure. Pt will message or call with update.

## 2023-08-26 NOTE — Telephone Encounter (Signed)
Please call and see if feels needs to be seen to help determine cause of symptoms (covid?, strep throat?, etc).  Regarding symptoms, if nasal congestion and drainage - saline nasal spray - flush nose 1-2x/day, nasacort nasal spray 2 sprays each nostril one time per day.  Do this in the evening.  Tylenol.  Throat lozenges, gargle with salt water -for symptom relief. May need appt

## 2023-08-28 ENCOUNTER — Other Ambulatory Visit (HOSPITAL_COMMUNITY): Payer: Self-pay

## 2023-08-29 ENCOUNTER — Other Ambulatory Visit (HOSPITAL_COMMUNITY): Payer: Self-pay

## 2023-09-15 ENCOUNTER — Other Ambulatory Visit (HOSPITAL_COMMUNITY): Payer: Self-pay

## 2023-09-15 MED ORDER — LISDEXAMFETAMINE DIMESYLATE 30 MG PO CAPS
30.0000 mg | ORAL_CAPSULE | Freq: Every day | ORAL | 0 refills | Status: DC
  Filled 2023-10-03: qty 30, 30d supply, fill #0

## 2023-09-16 ENCOUNTER — Other Ambulatory Visit (HOSPITAL_COMMUNITY): Payer: Self-pay

## 2023-10-03 ENCOUNTER — Other Ambulatory Visit (HOSPITAL_COMMUNITY): Payer: Self-pay

## 2023-10-06 ENCOUNTER — Other Ambulatory Visit (HOSPITAL_COMMUNITY): Payer: Self-pay

## 2023-10-06 MED ORDER — LISDEXAMFETAMINE DIMESYLATE 40 MG PO CAPS
40.0000 mg | ORAL_CAPSULE | Freq: Every day | ORAL | 0 refills | Status: DC
  Filled 2023-10-06: qty 30, 30d supply, fill #0

## 2023-10-24 ENCOUNTER — Other Ambulatory Visit: Payer: Self-pay | Admitting: Internal Medicine

## 2023-11-03 ENCOUNTER — Other Ambulatory Visit (HOSPITAL_COMMUNITY): Payer: Self-pay

## 2023-11-04 ENCOUNTER — Other Ambulatory Visit (HOSPITAL_COMMUNITY): Payer: Self-pay

## 2023-11-04 MED ORDER — LISDEXAMFETAMINE DIMESYLATE 40 MG PO CAPS
40.0000 mg | ORAL_CAPSULE | Freq: Every day | ORAL | 0 refills | Status: DC
  Filled 2023-11-04: qty 30, 30d supply, fill #0

## 2023-11-05 ENCOUNTER — Other Ambulatory Visit (HOSPITAL_COMMUNITY): Payer: Self-pay

## 2023-11-07 ENCOUNTER — Other Ambulatory Visit (HOSPITAL_COMMUNITY): Payer: Self-pay

## 2023-12-23 ENCOUNTER — Other Ambulatory Visit: Payer: Self-pay | Admitting: Internal Medicine

## 2024-01-06 ENCOUNTER — Other Ambulatory Visit (INDEPENDENT_AMBULATORY_CARE_PROVIDER_SITE_OTHER): Payer: No Typology Code available for payment source

## 2024-01-06 DIAGNOSIS — E7800 Pure hypercholesterolemia, unspecified: Secondary | ICD-10-CM | POA: Diagnosis not present

## 2024-01-06 DIAGNOSIS — R739 Hyperglycemia, unspecified: Secondary | ICD-10-CM | POA: Diagnosis not present

## 2024-01-06 DIAGNOSIS — D649 Anemia, unspecified: Secondary | ICD-10-CM | POA: Diagnosis not present

## 2024-01-06 LAB — HEPATIC FUNCTION PANEL
ALT: 18 U/L (ref 0–35)
AST: 18 U/L (ref 0–37)
Albumin: 4.4 g/dL (ref 3.5–5.2)
Alkaline Phosphatase: 45 U/L (ref 39–117)
Bilirubin, Direct: 0.1 mg/dL (ref 0.0–0.3)
Total Bilirubin: 0.4 mg/dL (ref 0.2–1.2)
Total Protein: 7.5 g/dL (ref 6.0–8.3)

## 2024-01-06 LAB — CBC WITH DIFFERENTIAL/PLATELET
Basophils Absolute: 0 10*3/uL (ref 0.0–0.1)
Basophils Relative: 0.4 % (ref 0.0–3.0)
Eosinophils Absolute: 0.1 10*3/uL (ref 0.0–0.7)
Eosinophils Relative: 2 % (ref 0.0–5.0)
HCT: 39.9 % (ref 36.0–46.0)
Hemoglobin: 12.8 g/dL (ref 12.0–15.0)
Lymphocytes Relative: 33.1 % (ref 12.0–46.0)
Lymphs Abs: 1.8 10*3/uL (ref 0.7–4.0)
MCHC: 32.2 g/dL (ref 30.0–36.0)
MCV: 75.6 fl — ABNORMAL LOW (ref 78.0–100.0)
Monocytes Absolute: 0.5 10*3/uL (ref 0.1–1.0)
Monocytes Relative: 8.9 % (ref 3.0–12.0)
Neutro Abs: 3 10*3/uL (ref 1.4–7.7)
Neutrophils Relative %: 55.6 % (ref 43.0–77.0)
Platelets: 256 10*3/uL (ref 150.0–400.0)
RBC: 5.27 Mil/uL — ABNORMAL HIGH (ref 3.87–5.11)
RDW: 14 % (ref 11.5–15.5)
WBC: 5.3 10*3/uL (ref 4.0–10.5)

## 2024-01-06 LAB — BASIC METABOLIC PANEL WITH GFR
BUN: 9 mg/dL (ref 6–23)
CO2: 29 meq/L (ref 19–32)
Calcium: 9.3 mg/dL (ref 8.4–10.5)
Chloride: 104 meq/L (ref 96–112)
Creatinine, Ser: 1.06 mg/dL (ref 0.40–1.20)
GFR: 61.39 mL/min (ref >60.00)
Glucose, Bld: 90 mg/dL (ref 70–99)
Potassium: 4.2 meq/L (ref 3.5–5.1)
Sodium: 139 meq/L (ref 135–145)

## 2024-01-06 LAB — LIPID PANEL
Cholesterol: 155 mg/dL (ref 0–200)
HDL: 64.7 mg/dL (ref >39.00)
LDL Cholesterol: 79 mg/dL (ref 0–99)
NonHDL: 90.66
Total CHOL/HDL Ratio: 2
Triglycerides: 58 mg/dL (ref 0.0–149.0)
VLDL: 11.6 mg/dL (ref 0.0–40.0)

## 2024-01-06 LAB — FERRITIN: Ferritin: 38 ng/mL (ref 10.0–291.0)

## 2024-01-06 LAB — HEMOGLOBIN A1C: Hgb A1c MFr Bld: 6 % (ref 4.6–6.5)

## 2024-01-08 ENCOUNTER — Ambulatory Visit (INDEPENDENT_AMBULATORY_CARE_PROVIDER_SITE_OTHER): Payer: No Typology Code available for payment source | Admitting: Internal Medicine

## 2024-01-08 VITALS — BP 120/70 | HR 90 | Temp 98.0°F | Resp 16 | Ht 67.0 in | Wt 211.2 lb

## 2024-01-08 DIAGNOSIS — F419 Anxiety disorder, unspecified: Secondary | ICD-10-CM

## 2024-01-08 DIAGNOSIS — E7800 Pure hypercholesterolemia, unspecified: Secondary | ICD-10-CM

## 2024-01-08 DIAGNOSIS — I10 Essential (primary) hypertension: Secondary | ICD-10-CM

## 2024-01-08 DIAGNOSIS — Z124 Encounter for screening for malignant neoplasm of cervix: Secondary | ICD-10-CM

## 2024-01-08 DIAGNOSIS — Z0000 Encounter for general adult medical examination without abnormal findings: Secondary | ICD-10-CM | POA: Diagnosis not present

## 2024-01-08 DIAGNOSIS — D649 Anemia, unspecified: Secondary | ICD-10-CM

## 2024-01-08 DIAGNOSIS — Z713 Dietary counseling and surveillance: Secondary | ICD-10-CM | POA: Diagnosis not present

## 2024-01-08 DIAGNOSIS — E039 Hypothyroidism, unspecified: Secondary | ICD-10-CM

## 2024-01-08 DIAGNOSIS — R232 Flushing: Secondary | ICD-10-CM

## 2024-01-08 DIAGNOSIS — F339 Major depressive disorder, recurrent, unspecified: Secondary | ICD-10-CM

## 2024-01-08 DIAGNOSIS — R739 Hyperglycemia, unspecified: Secondary | ICD-10-CM

## 2024-01-08 MED ORDER — INTEGRA 62.5-62.5-40-3 MG PO CAPS: 1.0000 | ORAL_CAPSULE | Freq: Every day | ORAL | 1 refills | Status: DC

## 2024-01-08 MED ORDER — GABAPENTIN 100 MG PO CAPS
100.0000 mg | ORAL_CAPSULE | Freq: Every day | ORAL | 2 refills | Status: AC
  Filled 2024-09-22 – 2024-10-19 (×2): qty 30, 30d supply, fill #0

## 2024-01-08 MED ORDER — ROSUVASTATIN CALCIUM 10 MG PO TABS
10.0000 mg | ORAL_TABLET | Freq: Every day | ORAL | 3 refills | Status: DC
  Filled 2024-08-12: qty 90, 90d supply, fill #0

## 2024-01-08 MED ORDER — ZEPBOUND 10 MG/0.5ML ~~LOC~~ SOAJ: 10.0000 mg | SUBCUTANEOUS | 2 refills | Status: DC

## 2024-01-08 NOTE — Progress Notes (Signed)
 Subjective:    Patient ID: [name] Nielsen, female    DOB: 02-02-1976, 48 y.o.   MRN: [id number]  Patient here for follow up appt.   HPI Here for a physical exam. Was scheduled for physical, but appt changed to follow up. Followed by psychiatry. Previously on wellbutrin. Changed to prozac. Did not tolerate. Changed to avelity. Feels she is doing well on this medication. Taking zepbound. Tolerating. No increased nausea. No increased bowel issues reported. Reports hot flashes. Will wake her up. LMP after Christmas. Previous - 05/2024. Feels needs something to help with hot flashes and sleep.    Past Medical History:  Diagnosis Date   Anemia    hx of    Anxiety    COVID-19 virus infection 11/2019   Depression    Frequent headaches    hx of   Heart murmur    Hx: UTI (urinary tract infection)    child, s/p urinary procecure   Hypercholesterolemia    Hypertension    Hypothyroidism    Past Surgical History:  Procedure Laterality Date   CHOLECYSTECTOMY  2012   COLONOSCOPY     > 10 yrs ago- normal    LAPAROSCOPIC GASTRIC SLEEVE RESECTION N/A 07/05/2014   Procedure: LAPAROSCOPIC GASTRIC SLEEVE RESECTION with upper endoscopy;  Surgeon: Wenda Low, MD;  Location: [facility];  Service: General;  Laterality: N/A;   TUBAL LIGATION  1999   WISDOM TOOTH EXTRACTION     age 78    Family History  Problem Relation Age of Onset   Cancer Mother        brain   Diabetes Father    Hypertension Father    Kidney disease Father    Stroke Father    Cancer Father        prostate   Prostate cancer Father    Breast cancer Neg Hx    Colon cancer Neg Hx    Colon polyps Neg Hx    Esophageal cancer Neg Hx    Rectal cancer Neg Hx    Stomach cancer Neg Hx    Social History   Socioeconomic History   Marital status: Single    Spouse name: Not on file   Number of children: Not on file   Years of education: Not on file   Highest education level: Master's degree (e.g., MA, MS, MEng, MEd, MSW, MBA)   Occupational History   Not on file  Tobacco Use   Smoking status: Former   Smokeless tobacco: Never  Substance and Sexual Activity   Alcohol use: No    Alcohol/week: 0.0 standard drinks of alcohol   Drug use: No   Sexual activity: Not on file  Other Topics Concern   Not on file  Social History Narrative   Not on file   Social Drivers of Health   Financial Resource Strain: Low Risk  (01/06/2024)   Overall Financial Resource Strain (CARDIA)    Difficulty of Paying Living Expenses: Not very hard  Food Insecurity: No Food Insecurity (01/06/2024)   Hunger Vital Sign    Worried About Running Out of Food in the Last Year: Never true    Ran Out of Food in the Last Year: Never true  Transportation Needs: No Transportation Needs (01/06/2024)   PRAPARE - Administrator, Civil Service (Medical): No    Lack of Transportation (Non-Medical): No  Physical Activity: Insufficiently Active (01/06/2024)   Exercise Vital Sign    Days of Exercise per Week: 2 days  Minutes of Exercise per Session: 30 min  Stress: No Stress Concern Present (01/06/2024)   [employer] of Occupational Health - Occupational Stress Questionnaire    Feeling of Stress : Only a little  Social Connections: Moderately Isolated (01/06/2024)   Social Connection and Isolation Panel [NHANES]    Frequency of Communication with Friends and Family: More than three times a week    Frequency of Social Gatherings with Friends and Family: Once a week    Attends Religious Services: Never    Database administrator or Organizations: Yes    Attends Banker Meetings: 1 to 4 times per year    Marital Status: Separated     Review of Systems  Constitutional:  Negative for appetite change and unexpected weight change.  HENT:  Negative for congestion and sinus pressure.   Respiratory:  Negative for cough, chest tightness and shortness of breath.   Cardiovascular:  Negative for chest pain and palpitations.   Gastrointestinal:  Negative for abdominal pain, nausea and vomiting.  Genitourinary:  Negative for difficulty urinating and dysuria.  Musculoskeletal:  Negative for joint swelling and myalgias.  Skin:  Negative for color change and rash.  Neurological:  Negative for dizziness and headaches.  Psychiatric/Behavioral:  Negative for agitation and dysphoric mood.        Objective:     BP 120/70   Pulse 90   Temp 98 F (36.7 C)   Resp 16   Ht 5\' 7"  (1.702 m)   Wt 211 lb 3.2 oz (95.8 kg)   SpO2 98%   BMI 33.08 kg/m  Wt Readings from Last 3 Encounters:  01/08/24 211 lb 3.2 oz (95.8 kg)  07/10/23 210 lb 6.4 oz (95.4 kg)  04/09/23 201 lb (91.2 kg)    Physical Exam Vitals reviewed.  Constitutional:      General: She is not in acute distress.    Appearance: Normal appearance.  HENT:     Head: Normocephalic and atraumatic.     Right Ear: External ear normal.     Left Ear: External ear normal.     Mouth/Throat:     Pharynx: No oropharyngeal exudate or posterior oropharyngeal erythema.  Eyes:     General: No scleral icterus.       Right eye: No discharge.        Left eye: No discharge.     Conjunctiva/sclera: Conjunctivae normal.  Neck:     Thyroid: No thyromegaly.  Cardiovascular:     Rate and Rhythm: Normal rate and regular rhythm.  Pulmonary:     Effort: No respiratory distress.     Breath sounds: Normal breath sounds. No wheezing.  Abdominal:     General: Bowel sounds are normal.     Palpations: Abdomen is soft.     Tenderness: There is no abdominal tenderness.  Musculoskeletal:        General: No swelling or tenderness.     Cervical back: Neck supple. No tenderness.  Lymphadenopathy:     Cervical: No cervical adenopathy.  Skin:    Findings: No erythema or rash.  Neurological:     Mental Status: She is alert.  Psychiatric:        Mood and Affect: Mood normal.        Behavior: Behavior normal.         Outpatient Encounter Medications as of 01/08/2024   Medication Sig   gabapentin (NEURONTIN) 100 MG capsule Take 1 capsule (100 mg total) by mouth at  bedtime.   lisdexamfetamine (VYVANSE) 40 MG capsule Take 40 mg by mouth every morning.   acetaminophen (TYLENOL) 500 MG tablet Take 1,000 mg by mouth every 6 (six) hours as needed for mild pain or moderate pain.   ALPRAZolam (XANAX) 0.25 MG tablet Take 1 tablet (0.25 mg total) by mouth daily as needed for anxiety.   AUVELITY 45-105 MG TBCR Take 1 tablet by mouth daily.   cholecalciferol (VITAMIN D3) 25 MCG (1000 UNIT) tablet Take 2,000 Units by mouth daily.   Cyanocobalamin (B-12 PO) Take by mouth.   Fe Fum-FePoly-Vit C-Vit B3 (INTEGRA) 62.5-62.5-40-3 MG CAPS Take 1 capsule by mouth daily.   levothyroxine (SYNTHROID) 100 MCG tablet TAKE 1 TABLET BY MOUTH ONCE DAILY BEFORE BREAKFAST   MAGnesium-Oxide 400 (240 Mg) MG tablet Take 1 tablet (400 mg total) by mouth daily.   methylphenidate 36 MG PO CR tablet Take 36 mg by mouth every morning.   rosuvastatin (CRESTOR) 10 MG tablet Take 1 tablet (10 mg total) by mouth daily.   tirzepatide (ZEPBOUND) 10 MG/0.5ML Pen Inject 10 mg into the skin once a week.   [DISCONTINUED] Fe Fum-FePoly-Vit C-Vit B3 (INTEGRA) 62.5-62.5-40-3 MG CAPS Take 1 capsule by mouth daily.   [DISCONTINUED] lisdexamfetamine (VYVANSE) 30 MG capsule Take 1 capsule (30 mg total) by mouth daily.   [DISCONTINUED] lisdexamfetamine (VYVANSE) 30 MG capsule Take 1 capsule (30 mg total) by mouth daily (fill 09/25/23)   [DISCONTINUED] rosuvastatin (CRESTOR) 10 MG tablet Take 1 tablet (10 mg total) by mouth daily.   [DISCONTINUED] tirzepatide (ZEPBOUND) 10 MG/0.5ML Pen Inject 10 mg into the skin once a week.   No facility-administered encounter medications on file as of 01/08/2024.     Lab Results  Component Value Date   WBC 5.3 01/06/2024   HGB 12.8 01/06/2024   HCT 39.9 01/06/2024   PLT 256.0 01/06/2024   GLUCOSE 90 01/06/2024   CHOL 155 01/06/2024   TRIG 58.0 01/06/2024   HDL 64.70  01/06/2024   LDLDIRECT 178.3 11/11/2013   LDLCALC 79 01/06/2024   ALT 18 01/06/2024   AST 18 01/06/2024   NA 139 01/06/2024   K 4.2 01/06/2024   CL 104 01/06/2024   CREATININE 1.06 01/06/2024   BUN 9 01/06/2024   CO2 29 01/06/2024   TSH 3.63 04/30/2023   HGBA1C 6.0 01/06/2024    MM 3D SCREENING MAMMOGRAM BILATERAL BREAST Result Date: 07/04/2023 CLINICAL DATA:  Screening. EXAM: DIGITAL SCREENING BILATERAL MAMMOGRAM WITH TOMOSYNTHESIS AND CAD TECHNIQUE: Bilateral screening digital craniocaudal and mediolateral oblique mammograms were obtained. Bilateral screening digital breast tomosynthesis was performed. The images were evaluated with computer-aided detection. COMPARISON:  Previous exam(s). ACR Breast Density Category b: There are scattered areas of fibroglandular density. FINDINGS: There are no findings suspicious for malignancy. IMPRESSION: No mammographic evidence of malignancy. A result letter of this screening mammogram will be mailed directly to the patient. RECOMMENDATION: Screening mammogram in one year. (Code:SM-B-01Y) BI-RADS CATEGORY  1: Negative. Electronically Signed   By: Baird Lyons M.D.   On: 07/04/2023 12:39       Assessment & Plan:  Health care maintenance Assessment & Plan: Physical was scheduled for today 01/08/24.  Changed to f/u appt.  Mammogram 07/03/23 - Birads I.  PAP 06/2020 benign changes/repair- negative with negative HPV.  PAP 01/08/23 - negative with negative HPV. Colonoscopy 12/2020 - normal.  No polyps.  Recommended f/u in 10 years.     Hypercholesterolemia Assessment & Plan: The 10-year ASCVD risk score (Arnett DK, et al.,  2019) is: 0.6%   Values used to calculate the score:     Age: 49 years     Sex: Female     Is Non-Hispanic African American: No     Diabetic: No     Tobacco smoker: No     Systolic Blood Pressure: 120 mmHg     Is BP treated: No     HDL Cholesterol: 64.7 mg/dL     Total Cholesterol: 155 mg/dL  Low cholesterol diet and exercise.   Follow lipid panel.   Orders: -     Lipid panel; Future -     Hepatic function panel; Future -     Basic metabolic panel with GFR; Future -     Rosuvastatin Calcium; Take 1 tablet (10 mg total) by mouth daily.  Dispense: 90 tablet; Refill: 3  Hyperglycemia Assessment & Plan: Low carb diet and exercise. Follow met b and A1c.   Orders: -     Hemoglobin A1c; Future  Weight loss counseling, encounter for Assessment & Plan: Continues on zepbound. Tolerating.    Hypothyroidism, unspecified type Assessment & Plan: On thyroid replacement. Follow tsh.    Essential hypertension, benign Assessment & Plan: Off medication. Blood pressure doing well. No change.    Depression, recurrent (HCC) Assessment & Plan: Followed by psychiatry. Previously on wellbutrin. Changed to prozac. Did not tolerate. Changed to avelity. Feels she is doing well on this medication. Continue f/u with psychiatry.    Cervical cancer screening Assessment & Plan: PAP smear 01/08/23 - negative with negative HPV.    Anxiety Assessment & Plan: Followed by psychiatry. Previously on wellbutrin. Changed to prozac. Did not tolerate. Changed to avelity. Feels she is doing well on this medication. Continue f/u with psychiatry.    Anemia, unspecified type Assessment & Plan: History of gastric surgery.  Periods as outlined.  Follow cbc. Recent hgb wnl. Remains on iron supplements. Follow iron studies.    Hot flashes Assessment & Plan: Hot flashes. Affecting sleep. Periods as outlined. Discussed treatment options. Trial of gabapentin 100mg  q hs. Follow. Call with update.    Other orders -     Integra; Take 1 capsule by mouth daily.  Dispense: 90 capsule; Refill: 1 -     Zepbound; Inject 10 mg into the skin once a week.  Dispense: 2 mL; Refill: 2 -     Gabapentin; Take 1 capsule (100 mg total) by mouth at bedtime.  Dispense: 30 capsule; Refill: 2     Dale [place], MD

## 2024-01-08 NOTE — Assessment & Plan Note (Addendum)
 Physical was scheduled for today 01/08/24.  Changed to f/u appt.  Mammogram 07/03/23 - Birads I.  PAP 06/2020 benign changes/repair- negative with negative HPV.  PAP 01/08/23 - negative with negative HPV. Colonoscopy 12/2020 - normal.  No polyps.  Recommended f/u in 10 years.

## 2024-01-11 ENCOUNTER — Encounter: Payer: Self-pay | Admitting: Internal Medicine

## 2024-01-11 DIAGNOSIS — R232 Flushing: Secondary | ICD-10-CM | POA: Insufficient documentation

## 2024-01-11 NOTE — Assessment & Plan Note (Signed)
 Low-carb diet and exercise.  Follow met b and A1c.

## 2024-01-11 NOTE — Assessment & Plan Note (Signed)
 The 10-year ASCVD risk score (Arnett DK, et al., 2019) is: 0.6%   Values used to calculate the score:     Age: 48 years     Sex: Female     Is Non-Hispanic African American: No     Diabetic: No     Tobacco smoker: No     Systolic Blood Pressure: 120 mmHg     Is BP treated: No     HDL Cholesterol: 64.7 mg/dL     Total Cholesterol: 155 mg/dL  Low cholesterol diet and exercise.  Follow lipid panel.

## 2024-01-11 NOTE — Assessment & Plan Note (Signed)
 Followed by psychiatry. Previously on wellbutrin. Changed to prozac. Did not tolerate. Changed to avelity. Feels she is doing well on this medication. Continue f/u with psychiatry.

## 2024-01-11 NOTE — Assessment & Plan Note (Signed)
 Hot flashes. Affecting sleep. Periods as outlined. Discussed treatment options. Trial of gabapentin 100mg  q hs. Follow. Call with update.

## 2024-01-11 NOTE — Assessment & Plan Note (Signed)
 On thyroid replacement.  Follow tsh.

## 2024-01-11 NOTE — Assessment & Plan Note (Signed)
 Off medication. Blood pressure doing well. No change.

## 2024-01-11 NOTE — Assessment & Plan Note (Signed)
 Continues on zepbound. Tolerating.

## 2024-01-11 NOTE — Assessment & Plan Note (Signed)
 PAP smear 01/08/23 - negative with negative HPV.

## 2024-01-11 NOTE — Assessment & Plan Note (Signed)
 History of gastric surgery.  Periods as outlined.  Follow cbc. Recent hgb wnl. Remains on iron supplements. Follow iron studies.

## 2024-01-24 ENCOUNTER — Other Ambulatory Visit: Payer: Self-pay | Admitting: Internal Medicine

## 2024-02-21 ENCOUNTER — Other Ambulatory Visit: Payer: Self-pay | Admitting: Internal Medicine

## 2024-02-26 ENCOUNTER — Encounter: Payer: Self-pay | Admitting: Internal Medicine

## 2024-02-26 ENCOUNTER — Ambulatory Visit: Payer: Self-pay | Admitting: *Deleted

## 2024-02-26 NOTE — Telephone Encounter (Signed)
 Spoke with pt and informed her of the message below. Pt is aware and gave a verbal understanding.

## 2024-02-26 NOTE — Telephone Encounter (Signed)
   Chief Complaint: sciatic flare- patient requesting Ibuprofen 800mg  Symptoms: lower back/leg pain(R), rated at 5/10 at this point Frequency: started yesterday  Pertinent Negatives: Patient denies weakness, numbness, or problems with bowel/bladder control Disposition: [] ED /[] Urgent Care (no appt availability in office) / [] Appointment(In office/virtual)/ []  [place] [facility]/ [] Home Care/ [x] Refused Recommended Disposition /[] [place] [facility]/ []  Follow-up with PCP Additional Notes: Patient states she does have upcoming appointment next month and this is a chronic issue. Patient will come in if needed- but no open appointment until Tuesday- patient request note to provider to see if she will send Rx for Ibuprofen 800mg /[facility]  Copied from CRM [id number]. Topic: Clinical - Red Word Triage >> Feb 26, 2024  4:20 PM [place] E wrote: [facility] that prompted transfer to Nurse Triage: Sciatic nerve pain flare up. Patient stated this flare up started yesterday and rated the pain a 5 out of 10. Reason for Disposition  [1] Pain radiates into the thigh or further down the leg AND [2] one leg  Answer Assessment - Initial Assessment Questions 1. ONSET: "When did the pain begin?"      Started yesterday 2. LOCATION: "Where does it hurt?" (upper, mid or lower back)     Lower back into R leg 3. SEVERITY: "How bad is the pain?"  (e.g., Scale 1-10; mild, moderate, or severe)   - MILD (1-3): Doesn't interfere with normal activities.    - MODERATE (4-7): Interferes with normal activities or awakens from sleep.    - SEVERE (8-10): Excruciating pain, unable to do any normal activities.      5/10 4. PATTERN: "Is the pain constant?" (e.g., yes, no; constant, intermittent)      Hurts when moves 5. RADIATION: "Does the pain shoot into your legs or somewhere else?"     Into the leg- back to right of thigh 6. CAUSE:  "What do you think is causing the back pain?"      Sciatic flare- it's  been about 2 years since flare 7. BACK OVERUSE:  "Any recent lifting of heavy objects, strenuous work or exercise?"     Not sure- more car travel 8. MEDICINES: "What have you taken so far for the pain?" (e.g., nothing, acetaminophen , NSAIDS)     Tylenol , ibuprofen 9. NEUROLOGIC SYMPTOMS: "Do you have any weakness, numbness, or problems with bowel/bladder control?"     no 10. OTHER SYMPTOMS: "Do you have any other symptoms?" (e.g., fever, abdomen pain, burning with urination, blood in urine)       no  Protocols used: Back Pain-A-AH

## 2024-02-26 NOTE — Telephone Encounter (Signed)
 Has she tried tylenol  arthritis. If not, tylenol  arthritis 2 tablets bid would be better than antiinflammatory medication regarding her blood pressure and kidney function. Can try low dose of ibuprofen with this to minimize the amount and see if controls. If persistent problems, let us  know. Can work her in  if persistent problem.

## 2024-02-26 NOTE — Telephone Encounter (Signed)
 LM for patient

## 2024-02-26 NOTE — Telephone Encounter (Signed)
 Pt called in to let you know that her sciatica has flared up and would like to know if you can send in a rx for 800 mg Ibuprofen. Pt stated that she is scheduled to see you next month and this is a chronic issue. She stated that she will come in if she needs to. Pt stated that the pain is right sided lower back radiating down into right buttock and thigh. Pt stated that the pain is like a burning sensation. She stated that the only relief she gets is when she sits a certain way. She stated that is started getting painful yesterday late afternoon.

## 2024-03-02 NOTE — Telephone Encounter (Signed)
 See phone note

## 2024-03-10 ENCOUNTER — Other Ambulatory Visit (HOSPITAL_COMMUNITY): Payer: Self-pay

## 2024-03-10 MED ORDER — LISDEXAMFETAMINE DIMESYLATE 40 MG PO CAPS
40.0000 mg | ORAL_CAPSULE | Freq: Every morning | ORAL | 0 refills | Status: DC
  Filled 2024-03-10: qty 30, 30d supply, fill #0

## 2024-03-16 ENCOUNTER — Other Ambulatory Visit: Payer: Self-pay

## 2024-03-16 ENCOUNTER — Emergency Department (HOSPITAL_BASED_OUTPATIENT_CLINIC_OR_DEPARTMENT_OTHER): Admitting: Radiology

## 2024-03-16 ENCOUNTER — Emergency Department (HOSPITAL_BASED_OUTPATIENT_CLINIC_OR_DEPARTMENT_OTHER)
Admission: EM | Admit: 2024-03-16 | Discharge: 2024-03-17 | Disposition: A | Discharge status: Home / Self Care | Attending: Emergency Medicine | Admitting: Emergency Medicine

## 2024-03-16 ENCOUNTER — Encounter (HOSPITAL_BASED_OUTPATIENT_CLINIC_OR_DEPARTMENT_OTHER): Payer: Self-pay

## 2024-03-16 DIAGNOSIS — Z79899 Other long term (current) drug therapy: Secondary | ICD-10-CM | POA: Insufficient documentation

## 2024-03-16 DIAGNOSIS — M546 Pain in thoracic spine: Secondary | ICD-10-CM | POA: Insufficient documentation

## 2024-03-16 DIAGNOSIS — M79632 Pain in left forearm: Secondary | ICD-10-CM | POA: Insufficient documentation

## 2024-03-16 DIAGNOSIS — E039 Hypothyroidism, unspecified: Secondary | ICD-10-CM | POA: Diagnosis not present

## 2024-03-16 DIAGNOSIS — M25512 Pain in left shoulder: Secondary | ICD-10-CM | POA: Insufficient documentation

## 2024-03-16 DIAGNOSIS — Y92410 Unspecified street and highway as the place of occurrence of the external cause: Secondary | ICD-10-CM | POA: Diagnosis not present

## 2024-03-16 DIAGNOSIS — Z8616 Personal history of COVID-19: Secondary | ICD-10-CM | POA: Diagnosis not present

## 2024-03-16 DIAGNOSIS — I10 Essential (primary) hypertension: Secondary | ICD-10-CM | POA: Insufficient documentation

## 2024-03-16 DIAGNOSIS — R0789 Other chest pain: Secondary | ICD-10-CM | POA: Insufficient documentation
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
## 2024-03-17 MED ORDER — IBUPROFEN 100 MG/5ML PO SUSP
400.0000 mg | Freq: Once | ORAL | Status: AC
  Administered 2024-03-17: 400 mg via ORAL
  Filled 2024-03-17: qty 20

## 2024-03-17 MED ORDER — ACETAMINOPHEN 500 MG PO TABS
1000.0000 mg | ORAL_TABLET | Freq: Once | ORAL | Status: AC
  Administered 2024-03-17: 1000 mg via ORAL
  Filled 2024-03-17: qty 2

## 2024-03-17 MED ORDER — IBUPROFEN 400 MG PO TABS
400.0000 mg | ORAL_TABLET | Freq: Once | ORAL | Status: AC | PRN
  Administered 2024-03-17: 400 mg via ORAL
  Filled 2024-03-17: qty 1

## 2024-03-17 NOTE — Discharge Instructions (Signed)
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
## 2024-03-23 ENCOUNTER — Other Ambulatory Visit (HOSPITAL_COMMUNITY): Payer: Self-pay

## 2024-03-23 MED ORDER — AUVELITY 45-105 MG PO TBCR
1.0000 | EXTENDED_RELEASE_TABLET | Freq: Two times a day (BID) | ORAL | 3 refills | Status: AC
  Filled 2024-03-23 – 2024-04-02 (×2): qty 60, 30d supply, fill #0
  Filled 2024-06-21 – 2024-07-15 (×2): qty 60, 30d supply, fill #1
  Filled 2024-08-12: qty 60, 30d supply, fill #2
  Filled 2024-10-08: qty 60, 30d supply, fill #3

## 2024-03-23 MED ORDER — LISDEXAMFETAMINE DIMESYLATE 40 MG PO CAPS
40.0000 mg | ORAL_CAPSULE | Freq: Every morning | ORAL | 0 refills | Status: DC
  Filled 2024-05-10: qty 30, 30d supply, fill #0

## 2024-03-27 ENCOUNTER — Other Ambulatory Visit: Payer: Self-pay | Admitting: Internal Medicine

## 2024-03-27 DIAGNOSIS — E039 Hypothyroidism, unspecified: Secondary | ICD-10-CM

## 2024-03-28 NOTE — Telephone Encounter (Signed)
 Will refill at [facility] on 6/23 with PCP. Last TSH was checked in July 2024 (lab pended)

## 2024-03-29 ENCOUNTER — Ambulatory Visit (INDEPENDENT_AMBULATORY_CARE_PROVIDER_SITE_OTHER): Admitting: Internal Medicine

## 2024-03-29 ENCOUNTER — Other Ambulatory Visit: Payer: Self-pay | Admitting: Internal Medicine

## 2024-03-29 VITALS — BP 122/70 | HR 84 | Temp 98.0°F | Resp 16 | Ht 67.0 in | Wt 219.8 lb

## 2024-03-29 DIAGNOSIS — E7800 Pure hypercholesterolemia, unspecified: Secondary | ICD-10-CM

## 2024-03-29 DIAGNOSIS — F339 Major depressive disorder, recurrent, unspecified: Secondary | ICD-10-CM

## 2024-03-29 DIAGNOSIS — M25512 Pain in left shoulder: Secondary | ICD-10-CM | POA: Insufficient documentation

## 2024-03-29 DIAGNOSIS — R739 Hyperglycemia, unspecified: Secondary | ICD-10-CM

## 2024-03-29 DIAGNOSIS — Z0000 Encounter for general adult medical examination without abnormal findings: Secondary | ICD-10-CM | POA: Diagnosis not present

## 2024-03-29 DIAGNOSIS — F419 Anxiety disorder, unspecified: Secondary | ICD-10-CM | POA: Diagnosis not present

## 2024-03-29 DIAGNOSIS — I10 Essential (primary) hypertension: Secondary | ICD-10-CM

## 2024-03-29 DIAGNOSIS — R232 Flushing: Secondary | ICD-10-CM

## 2024-03-29 DIAGNOSIS — E039 Hypothyroidism, unspecified: Secondary | ICD-10-CM

## 2024-03-29 MED ORDER — LEVOTHYROXINE SODIUM 100 MCG PO TABS: 100.0000 ug | ORAL_TABLET | Freq: Every day | ORAL | 1 refills | Status: DC

## 2024-03-29 MED ORDER — ZEPBOUND 10 MG/0.5ML ~~LOC~~ SOAJ
10.0000 mg | SUBCUTANEOUS | 2 refills | Status: DC
  Filled 2024-08-09 – 2024-08-12 (×3): qty 2, 28d supply, fill #0

## 2024-03-29 NOTE — Assessment & Plan Note (Signed)
 Followed by psychiatry. Previously on wellbutrin . Changed to prozac. Did not tolerate. Changed to avelity. Discussed. Stable on this medication. Continue f/u with psychiatry.

## 2024-03-29 NOTE — Assessment & Plan Note (Signed)
 Discussed menopausal symptoms and hot flashes as outlined.  Did not tolerate gabapentin .  Discussed other treatment options.  Wants to monitor.

## 2024-03-29 NOTE — Assessment & Plan Note (Signed)
 Physical today 03/29/24. Mammogram 07/03/23 - Birads I.  PAP 06/2020 benign changes/repair- negative with negative HPV.  PAP 01/08/23 - negative with negative HPV. Colonoscopy 12/2020 - normal.  No polyps.  Recommended f/u in 10 years.

## 2024-03-29 NOTE — Assessment & Plan Note (Signed)
 Followed by psychiatry. Previously on wellbutrin . Changed to prozac. Did not tolerate. Changed to avelity. Continue f/u with psychiatry. Stable.

## 2024-03-29 NOTE — Assessment & Plan Note (Signed)
 The 10-year ASCVD risk score (Arnett DK, et al., 2019) is: 0.7%   Values used to calculate the score:     Age: 48 years     Clincally relevant sex: Female     Is Non-Hispanic African American: No     Diabetic: No     Tobacco smoker: No     Systolic Blood Pressure: 122 mmHg     Is BP treated: No     HDL Cholesterol: 64.7 mg/dL     Total Cholesterol: 155 mg/dL  Low cholesterol diet and exercise.  Check lipid panel with next fasting labs.
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
## 2024-03-29 NOTE — Assessment & Plan Note (Signed)
On thyroid replacement.  Check tsh with next labs.  

## 2024-03-29 NOTE — Assessment & Plan Note (Signed)
 Low-carb diet and exercise.  Follow met b and A1c.

## 2024-03-29 NOTE — Assessment & Plan Note (Signed)
 Off medication. Blood pressure doing well. No change.
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
## 2024-04-02 ENCOUNTER — Ambulatory Visit (INDEPENDENT_AMBULATORY_CARE_PROVIDER_SITE_OTHER): Admitting: Orthopaedic Surgery

## 2024-04-02 ENCOUNTER — Other Ambulatory Visit: Payer: Self-pay

## 2024-04-02 ENCOUNTER — Other Ambulatory Visit (HOSPITAL_COMMUNITY): Payer: Self-pay

## 2024-04-02 DIAGNOSIS — M25512 Pain in left shoulder: Secondary | ICD-10-CM

## 2024-04-02 MED ORDER — PREDNISONE 10 MG (21) PO TBPK: ORAL_TABLET | ORAL | 3 refills | Status: DC
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
## 2024-04-07 ENCOUNTER — Other Ambulatory Visit (HOSPITAL_COMMUNITY): Payer: Self-pay

## 2024-04-08 ENCOUNTER — Other Ambulatory Visit (HOSPITAL_COMMUNITY): Payer: Self-pay

## 2024-04-08 MED ORDER — LISDEXAMFETAMINE DIMESYLATE 40 MG PO CAPS
40.0000 mg | ORAL_CAPSULE | Freq: Every morning | ORAL | 0 refills | Status: AC
  Filled 2024-04-08: qty 30, 30d supply, fill #0

## 2024-04-23 ENCOUNTER — Other Ambulatory Visit (HOSPITAL_COMMUNITY): Payer: Self-pay

## 2024-05-10 ENCOUNTER — Other Ambulatory Visit (HOSPITAL_COMMUNITY): Payer: Self-pay

## 2024-05-18 ENCOUNTER — Ambulatory Visit: Admitting: Orthopaedic Surgery

## 2024-05-26 ENCOUNTER — Other Ambulatory Visit: Payer: Self-pay | Admitting: Internal Medicine

## 2024-05-28 ENCOUNTER — Ambulatory Visit: Admitting: Orthopaedic Surgery

## 2024-06-08 ENCOUNTER — Other Ambulatory Visit (HOSPITAL_COMMUNITY): Payer: Self-pay

## 2024-06-08 MED ORDER — LISDEXAMFETAMINE DIMESYLATE 40 MG PO CAPS
40.0000 mg | ORAL_CAPSULE | Freq: Every morning | ORAL | 0 refills | Status: DC
  Filled 2024-06-08: qty 30, 30d supply, fill #0

## 2024-06-25 ENCOUNTER — Other Ambulatory Visit: Payer: Self-pay | Admitting: Medical Genetics

## 2024-06-29 ENCOUNTER — Ambulatory Visit: Payer: Self-pay | Admitting: Internal Medicine

## 2024-06-29 ENCOUNTER — Other Ambulatory Visit (INDEPENDENT_AMBULATORY_CARE_PROVIDER_SITE_OTHER)

## 2024-06-29 DIAGNOSIS — R739 Hyperglycemia, unspecified: Secondary | ICD-10-CM | POA: Diagnosis not present

## 2024-06-29 DIAGNOSIS — E039 Hypothyroidism, unspecified: Secondary | ICD-10-CM | POA: Diagnosis not present

## 2024-06-29 DIAGNOSIS — E7800 Pure hypercholesterolemia, unspecified: Secondary | ICD-10-CM

## 2024-06-29 LAB — BASIC METABOLIC PANEL WITH GFR
BUN: 13 mg/dL (ref 6–23)
CO2: 28 meq/L (ref 19–32)
Calcium: 9.6 mg/dL (ref 8.4–10.5)
Chloride: 103 meq/L (ref 96–112)
Creatinine, Ser: 1.11 mg/dL (ref 0.40–1.20)
GFR: 57.89 mL/min — ABNORMAL LOW (ref >60.00)
Glucose, Bld: 98 mg/dL (ref 70–99)
Potassium: 4.4 meq/L (ref 3.5–5.1)
Sodium: 139 meq/L (ref 135–145)

## 2024-06-29 LAB — TSH: TSH: 3.17 u[IU]/mL (ref 0.35–5.50)

## 2024-06-29 LAB — LIPID PANEL
Cholesterol: 199 mg/dL (ref 0–200)
HDL: 81.3 mg/dL (ref >39.00)
LDL Cholesterol: 101 mg/dL — ABNORMAL HIGH (ref 0–99)
NonHDL: 118.1
Total CHOL/HDL Ratio: 2
Triglycerides: 85 mg/dL (ref 0.0–149.0)
VLDL: 17 mg/dL (ref 0.0–40.0)

## 2024-06-29 LAB — HEPATIC FUNCTION PANEL
ALT: 37 U/L — ABNORMAL HIGH (ref 0–35)
AST: 24 U/L (ref 0–37)
Albumin: 4.4 g/dL (ref 3.5–5.2)
Alkaline Phosphatase: 45 U/L (ref 39–117)
Bilirubin, Direct: 0.1 mg/dL (ref 0.0–0.3)
Total Bilirubin: 0.3 mg/dL (ref 0.2–1.2)
Total Protein: 7.2 g/dL (ref 6.0–8.3)

## 2024-06-29 LAB — HEMOGLOBIN A1C: Hgb A1c MFr Bld: 6.2 % (ref 4.6–6.5)

## 2024-07-01 ENCOUNTER — Encounter: Payer: Self-pay | Admitting: Internal Medicine

## 2024-07-01 ENCOUNTER — Ambulatory Visit: Admitting: Internal Medicine

## 2024-07-01 ENCOUNTER — Ambulatory Visit: Payer: Self-pay | Admitting: Internal Medicine

## 2024-07-01 ENCOUNTER — Other Ambulatory Visit (HOSPITAL_COMMUNITY): Payer: Self-pay

## 2024-07-01 VITALS — BP 128/76 | HR 88 | Resp 16 | Ht 67.0 in | Wt 225.0 lb

## 2024-07-01 DIAGNOSIS — Z1231 Encounter for screening mammogram for malignant neoplasm of breast: Secondary | ICD-10-CM

## 2024-07-01 DIAGNOSIS — I10 Essential (primary) hypertension: Secondary | ICD-10-CM

## 2024-07-01 DIAGNOSIS — R739 Hyperglycemia, unspecified: Secondary | ICD-10-CM | POA: Diagnosis not present

## 2024-07-01 DIAGNOSIS — E7800 Pure hypercholesterolemia, unspecified: Secondary | ICD-10-CM | POA: Diagnosis not present

## 2024-07-01 DIAGNOSIS — R232 Flushing: Secondary | ICD-10-CM

## 2024-07-01 DIAGNOSIS — Z124 Encounter for screening for malignant neoplasm of cervix: Secondary | ICD-10-CM

## 2024-07-01 DIAGNOSIS — R41840 Attention and concentration deficit: Secondary | ICD-10-CM

## 2024-07-01 DIAGNOSIS — M791 Myalgia, unspecified site: Secondary | ICD-10-CM

## 2024-07-01 DIAGNOSIS — D649 Anemia, unspecified: Secondary | ICD-10-CM

## 2024-07-01 DIAGNOSIS — Z1283 Encounter for screening for malignant neoplasm of skin: Secondary | ICD-10-CM

## 2024-07-01 LAB — CK: Total CK: 95 U/L (ref 17–177)

## 2024-07-01 LAB — SEDIMENTATION RATE: Sed Rate: 14 mm/h (ref 0–30)

## 2024-07-01 MED ORDER — LEVOTHYROXINE SODIUM 100 MCG PO TABS
100.0000 ug | ORAL_TABLET | Freq: Every day | ORAL | 1 refills | Status: DC
  Filled 2024-08-12: qty 90, 90d supply, fill #0

## 2024-07-01 MED ORDER — INTEGRA 62.5-62.5-40-3 MG PO CAPS
1.0000 | ORAL_CAPSULE | Freq: Every day | ORAL | 1 refills | Status: AC
  Filled 2024-08-12: qty 30, 30d supply, fill #0
  Filled 2024-09-20: qty 30, 30d supply, fill #1
  Filled 2024-10-19: qty 30, 30d supply, fill #0

## 2024-07-01 NOTE — Patient Instructions (Signed)
 Hold crestor  - call with update over the next two weeks.
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
## 2024-07-01 NOTE — Assessment & Plan Note (Signed)
 The 10-year ASCVD risk score (Arnett DK, et al., 2019) is: 0.7%   Values used to calculate the score:     Age: 48 years     Clincally relevant sex: Female     Is Non-Hispanic African American: No     Diabetic: No     Tobacco smoker: No     Systolic Blood Pressure: 122 mmHg     Is BP treated: No     HDL Cholesterol: 81.3 mg/dL     Total Cholesterol: 199 mg/dL

## 2024-07-05 ENCOUNTER — Encounter: Payer: Self-pay | Admitting: Internal Medicine

## 2024-07-05 NOTE — Assessment & Plan Note (Signed)
 Hgb 01/06/24 - wnl.

## 2024-07-05 NOTE — Assessment & Plan Note (Signed)
 Have discussed menopausal symptoms and hot flashes as outlined.  Did not tolerate gabapentin .  Discussed other treatment options.  Currently doing well on avelity - controlling depression. Had period 04/2024 after months of no period. After discussion, she is interested in referral to gyn to discuss hormone therapy/other treatment options.

## 2024-07-05 NOTE — Assessment & Plan Note (Signed)
 Decrease in concentration and focus. Seeing psychiatry. Has done well on her current medication regimen. Feels her depression is better and controlled. Is concerned regarding current symptoms related to hormone changes. Also report mood swings and joint aching. Request referral to gyn to discuss possible hormone therapy.

## 2024-07-05 NOTE — Assessment & Plan Note (Signed)
 PAP smear 01/08/23 - negative with negative HPV.

## 2024-07-05 NOTE — Assessment & Plan Note (Signed)
 Given symptoms, will check esr and ck to confirm wnl.

## 2024-07-05 NOTE — Assessment & Plan Note (Signed)
 Blood pressure doing well off medication. Follow pressures. Remain off.

## 2024-07-14 ENCOUNTER — Other Ambulatory Visit (HOSPITAL_COMMUNITY): Payer: Self-pay

## 2024-07-15 ENCOUNTER — Other Ambulatory Visit: Payer: Self-pay

## 2024-07-15 MED ORDER — LISDEXAMFETAMINE DIMESYLATE 40 MG PO CAPS
40.0000 mg | ORAL_CAPSULE | Freq: Every morning | ORAL | 0 refills | Status: DC
  Filled 2024-07-15: qty 30, 30d supply, fill #0

## 2024-07-16 ENCOUNTER — Other Ambulatory Visit (HOSPITAL_COMMUNITY): Payer: Self-pay

## 2024-07-16 ENCOUNTER — Other Ambulatory Visit: Payer: Self-pay

## 2024-07-20 ENCOUNTER — Encounter: Payer: Self-pay | Admitting: Internal Medicine

## 2024-07-20 DIAGNOSIS — R232 Flushing: Secondary | ICD-10-CM

## 2024-07-20 DIAGNOSIS — N926 Irregular menstruation, unspecified: Secondary | ICD-10-CM

## 2024-07-21 ENCOUNTER — Other Ambulatory Visit (HOSPITAL_COMMUNITY): Payer: Self-pay

## 2024-07-21 NOTE — Telephone Encounter (Signed)
 I initially placed an order for [facility]. She messaged back and does not want to go to [place]. I placed another order for gyn referral in [place]. Just wanted you to be aware.

## 2024-07-21 NOTE — Telephone Encounter (Signed)
 I had initially placed an order for a referral to gyn and had placed for [place]. She messaged back and wants to see gyn in [place]. I have placed another order.  Thanks.

## 2024-07-22 ENCOUNTER — Other Ambulatory Visit (INDEPENDENT_AMBULATORY_CARE_PROVIDER_SITE_OTHER)

## 2024-07-22 DIAGNOSIS — R739 Hyperglycemia, unspecified: Secondary | ICD-10-CM

## 2024-07-22 DIAGNOSIS — E7800 Pure hypercholesterolemia, unspecified: Secondary | ICD-10-CM | POA: Diagnosis not present

## 2024-07-22 NOTE — Addendum Note (Signed)
 Addended by: MARYLEN PRO A on: 07/22/2024 02:18 PM   Modules accepted: Orders

## 2024-07-23 LAB — HEPATIC FUNCTION PANEL
ALT: 33 IU/L — ABNORMAL HIGH (ref 0–32)
AST: 21 IU/L (ref 0–40)
Albumin: 4.4 g/dL (ref 3.9–4.9)
Alkaline Phosphatase: 60 IU/L (ref 41–116)
Bilirubin Total: 0.2 mg/dL (ref 0.0–1.2)
Bilirubin, Direct: <0.08 mg/dL (ref 0.00–0.40)
Total Protein: 6.8 g/dL (ref 6.0–8.5)

## 2024-07-23 LAB — BASIC METABOLIC PANEL WITH GFR
BUN/Creatinine Ratio: 11 (ref 9–23)
BUN: 10 mg/dL (ref 6–24)
CO2: 22 mmol/L (ref 20–29)
Calcium: 9 mg/dL (ref 8.7–10.2)
Chloride: 103 mmol/L (ref 96–106)
Creatinine, Ser: 0.87 mg/dL (ref 0.57–1.00)
Glucose: 106 mg/dL — ABNORMAL HIGH (ref 70–99)
Potassium: 4.1 mmol/L (ref 3.5–5.2)
Sodium: 138 mmol/L (ref 134–144)
eGFR: 81 mL/min/1.73 (ref >59)

## 2024-07-23 LAB — LIPID PANEL
Chol/HDL Ratio: 2.3 ratio (ref 0.0–4.4)
Cholesterol, Total: 206 mg/dL — ABNORMAL HIGH (ref 100–199)
HDL: 88 mg/dL (ref >39)
LDL Chol Calc (NIH): 94 mg/dL (ref 0–99)
Triglycerides: 140 mg/dL (ref 0–149)
VLDL Cholesterol Cal: 24 mg/dL (ref 5–40)

## 2024-07-23 LAB — HEMOGLOBIN A1C
Est. average glucose Bld gHb Est-mCnc: 114 mg/dL
Hgb A1c MFr Bld: 5.6 % (ref 4.8–5.6)

## 2024-07-26 ENCOUNTER — Ambulatory Visit
Admission: RE | Admit: 2024-07-26 | Discharge: 2024-07-26 | Disposition: A | Discharge status: Home / Self Care | Source: Ambulatory Visit | Attending: Internal Medicine | Admitting: Internal Medicine

## 2024-07-26 DIAGNOSIS — Z1231 Encounter for screening mammogram for malignant neoplasm of breast: Secondary | ICD-10-CM | POA: Diagnosis present

## 2024-07-26 NOTE — Telephone Encounter (Signed)
 Noted

## 2024-07-26 NOTE — Progress Notes (Signed)
 Noted

## 2024-07-28 ENCOUNTER — Ambulatory Visit

## 2024-07-29 ENCOUNTER — Other Ambulatory Visit: Payer: Self-pay | Admitting: Internal Medicine

## 2024-08-05 ENCOUNTER — Other Ambulatory Visit: Payer: Self-pay

## 2024-08-05 MED ORDER — LISDEXAMFETAMINE DIMESYLATE 40 MG PO CAPS
40.0000 mg | ORAL_CAPSULE | Freq: Every morning | ORAL | 0 refills | Status: AC
  Filled 2024-08-05 – 2024-08-13 (×4): qty 30, 30d supply, fill #0

## 2024-08-06 ENCOUNTER — Other Ambulatory Visit: Payer: Self-pay

## 2024-08-09 ENCOUNTER — Other Ambulatory Visit: Payer: Self-pay

## 2024-08-09 ENCOUNTER — Encounter: Payer: Self-pay | Admitting: Radiology

## 2024-08-09 MED ORDER — PREDNISONE 10 MG (21) PO TBPK
ORAL_TABLET | ORAL | 3 refills | Status: DC
  Filled 2024-10-08: qty 21, 6d supply, fill #0

## 2024-08-09 MED ORDER — MAGNESIUM OXIDE 400 MG PO TABS
400.0000 mg | ORAL_TABLET | Freq: Every day | ORAL | 0 refills | Status: DC
  Filled 2024-08-09: qty 90, 90d supply, fill #0

## 2024-08-10 ENCOUNTER — Other Ambulatory Visit: Payer: Self-pay

## 2024-08-12 ENCOUNTER — Encounter: Payer: Self-pay | Admitting: Internal Medicine

## 2024-08-12 ENCOUNTER — Other Ambulatory Visit: Payer: Self-pay

## 2024-08-13 ENCOUNTER — Other Ambulatory Visit: Payer: Self-pay

## 2024-08-16 ENCOUNTER — Ambulatory Visit

## 2024-08-16 ENCOUNTER — Other Ambulatory Visit: Payer: Self-pay

## 2024-08-16 MED ORDER — AUVELITY 45-105 MG PO TBCR
1.0000 | EXTENDED_RELEASE_TABLET | Freq: Two times a day (BID) | ORAL | 3 refills | Status: DC
  Filled 2024-08-31 – 2024-09-06 (×2): qty 60, 30d supply, fill #0
  Filled 2024-09-22: qty 60, 30d supply, fill #1
  Filled 2024-10-19: qty 60, 30d supply, fill #0

## 2024-08-18 ENCOUNTER — Encounter: Payer: Self-pay | Admitting: Internal Medicine

## 2024-08-19 ENCOUNTER — Other Ambulatory Visit: Payer: Self-pay

## 2024-08-19 MED ORDER — ZEPBOUND 10 MG/0.5ML ~~LOC~~ SOAJ
10.0000 mg | SUBCUTANEOUS | 2 refills | Status: AC
  Filled 2024-08-19: qty 2, 28d supply, fill #0

## 2024-08-22 ENCOUNTER — Other Ambulatory Visit: Payer: Self-pay | Admitting: Internal Medicine

## 2024-08-23 NOTE — Telephone Encounter (Signed)
 Patient arrived almost 30 minutes late for appt.  Rescheduled for another date and time. aw

## 2024-08-26 ENCOUNTER — Other Ambulatory Visit: Payer: Self-pay

## 2024-08-26 MED ORDER — PROGESTERONE 200 MG PO CAPS
200.0000 mg | ORAL_CAPSULE | Freq: Every day | ORAL | 11 refills | Status: AC
  Filled 2024-08-26: qty 28, 28d supply, fill #0
  Filled 2024-09-13 – 2024-09-22 (×2): qty 28, 28d supply, fill #1
  Filled 2024-10-19: qty 28, 28d supply, fill #0

## 2024-08-26 MED ORDER — ESTRADIOL 0.05 MG/24HR TD PTTW
1.0000 | MEDICATED_PATCH | TRANSDERMAL | 0 refills | Status: AC
Start: 2024-08-26 — End: ?
  Filled 2024-08-26: qty 8, 28d supply, fill #0

## 2024-08-31 ENCOUNTER — Other Ambulatory Visit: Payer: Self-pay

## 2024-09-01 ENCOUNTER — Other Ambulatory Visit: Payer: Self-pay | Admitting: *Deleted

## 2024-09-01 DIAGNOSIS — E7800 Pure hypercholesterolemia, unspecified: Secondary | ICD-10-CM

## 2024-09-06 ENCOUNTER — Other Ambulatory Visit: Payer: Self-pay

## 2024-09-06 ENCOUNTER — Other Ambulatory Visit (INDEPENDENT_AMBULATORY_CARE_PROVIDER_SITE_OTHER)

## 2024-09-06 DIAGNOSIS — E7800 Pure hypercholesterolemia, unspecified: Secondary | ICD-10-CM

## 2024-09-06 LAB — HEPATIC FUNCTION PANEL
ALT: 14 U/L (ref 0–35)
AST: 16 U/L (ref 0–37)
Albumin: 4.5 g/dL (ref 3.5–5.2)
Alkaline Phosphatase: 52 U/L (ref 39–117)
Bilirubin, Direct: 0 mg/dL (ref 0.0–0.3)
Total Bilirubin: 0.5 mg/dL (ref 0.2–1.2)
Total Protein: 7.6 g/dL (ref 6.0–8.3)

## 2024-09-07 ENCOUNTER — Ambulatory Visit: Payer: Self-pay | Admitting: Internal Medicine

## 2024-09-13 ENCOUNTER — Other Ambulatory Visit: Payer: Self-pay

## 2024-09-13 ENCOUNTER — Ambulatory Visit

## 2024-09-13 DIAGNOSIS — L821 Other seborrheic keratosis: Secondary | ICD-10-CM

## 2024-09-13 DIAGNOSIS — W908XXA Exposure to other nonionizing radiation, initial encounter: Secondary | ICD-10-CM | POA: Diagnosis not present

## 2024-09-13 DIAGNOSIS — L578 Other skin changes due to chronic exposure to nonionizing radiation: Secondary | ICD-10-CM

## 2024-09-13 DIAGNOSIS — Z1283 Encounter for screening for malignant neoplasm of skin: Secondary | ICD-10-CM | POA: Diagnosis not present

## 2024-09-13 DIAGNOSIS — D229 Melanocytic nevi, unspecified: Secondary | ICD-10-CM

## 2024-09-13 DIAGNOSIS — D1801 Hemangioma of skin and subcutaneous tissue: Secondary | ICD-10-CM

## 2024-09-13 DIAGNOSIS — L814 Other melanin hyperpigmentation: Secondary | ICD-10-CM

## 2024-09-13 DIAGNOSIS — Q825 Congenital non-neoplastic nevus: Secondary | ICD-10-CM

## 2024-09-13 NOTE — Progress Notes (Signed)
    Subjective   [name] Nielsen is a 48 y.o. female who presents for the following: Total body skin exam for skin cancer screening and mole check. The patient has spots, moles and lesions to be evaluated, some may be new or changing and the patient may have concern these could be cancer.. Patient is new patient  Today patient reports: Patient states she has a birth mark on her left ankle. She has concerns of new appearing moles.  Review of Systems:    No other skin or systemic complaints except as noted in HPI or Assessment and Plan.  The following portions of the chart were reviewed this encounter and updated as appropriate: medications, allergies, medical history  Relevant Medical History:  n/a   Objective  (SKPE) Well appearing patient in no apparent distress; mood and affect are within normal limits. Examination was performed of the: Full Skin Examination: scalp, head, eyes, ears, nose, lips, neck, chest, axillae, abdomen, back, buttocks, bilateral upper extremities, bilateral lower extremities, hands, feet, fingers, toes, fingernails, and toenails.   Examination notable for: SKIN EXAM, Angioma(s): Scattered red vascular papule(s)  , Lentigo/lentigines: Scattered pigmented macules that are tan to brown in color and are somewhat non-uniform in shape and concentrated in the sun-exposed areas, Nevus/nevi: Scattered well-demarcated, regular, pigmented macule(s) and/or papule(s)  , Seborrheic Keratosis(es): Stuck-on appearing keratotic papule(s) on the trunk, none  irritated with redness, crusting, edema, and/or partial avulsion, Actinic Damage/Elastosis: chronic sun damage: dyspigmentation, telangiectasia, and wrinkling  Recurrent nevus at R forearm. 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  BENIGN SKIN FINDINGS  - Lentigines  - Seborrheic keratoses  - Hemangiomas    - Nevus/Multiple Benign Nevi  - L medial ankle half removed by surgery  - Recurrent nevus at R forearm - Reassurance provided regarding the benign appearance of lesions noted on exam today; no treatment is indicated in the absence of symptoms/changes. - Reinforced importance of photoprotective strategies including liberal and frequent sunscreen use of a broad-spectrum SPF 30 or greater, use of protective clothing, and sun avoidance for prevention of cutaneous malignancy and photoaging.  Counseled patient on the importance of regular self-skin monitoring as well as routine clinical skin examinations as scheduled.   ACTINIC DAMAGE - Chronic condition, secondary to cumulative UV/sun exposure - Recommend daily broad spectrum sunscreen SPF 30+ to sun-exposed areas, reapply every 2 hours as needed.  - Staying in the shade or wearing long sleeves, sun glasses (UVA+UVB protection) and wide brim hats (4-inch brim around the entire circumference of the hat) are also recommended for sun protection.  - Call for new or changing lesions.  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  Return to clinic: No follow-ups on file.  I, Almetta Nora, RMA, am acting as scribe for Lauraine JAYSON Kanaris, MD .   Documentation: I have reviewed the above documentation for accuracy and completeness, and I agree with the above.  Lauraine JAYSON Kanaris, MD

## 2024-09-13 NOTE — Patient Instructions (Signed)

## 2024-09-14 ENCOUNTER — Other Ambulatory Visit: Payer: Self-pay

## 2024-09-14 MED ORDER — LISDEXAMFETAMINE DIMESYLATE 40 MG PO CAPS
40.0000 mg | ORAL_CAPSULE | Freq: Every morning | ORAL | 0 refills | Status: DC
  Filled 2024-09-14: qty 30, 30d supply, fill #0

## 2024-09-20 ENCOUNTER — Other Ambulatory Visit: Payer: Self-pay

## 2024-09-22 ENCOUNTER — Other Ambulatory Visit: Payer: Self-pay | Admitting: Internal Medicine

## 2024-09-23 ENCOUNTER — Other Ambulatory Visit: Payer: Self-pay

## 2024-09-24 ENCOUNTER — Other Ambulatory Visit: Payer: Self-pay

## 2024-09-24 MED ORDER — ESTRADIOL 0.05 MG/24HR TD PTTW
MEDICATED_PATCH | TRANSDERMAL | 11 refills | Status: DC
  Filled 2024-09-24 – 2024-10-19 (×3): qty 8, 28d supply, fill #0

## 2024-09-27 ENCOUNTER — Other Ambulatory Visit: Payer: Self-pay

## 2024-09-27 NOTE — Telephone Encounter (Signed)
 Refilled: 07/30/2024 Last OV: 07/01/2024 Next OV: 10/22/2023

## 2024-09-28 ENCOUNTER — Other Ambulatory Visit: Payer: Self-pay

## 2024-09-28 MED ORDER — MAGNESIUM OXIDE 400 MG PO TABS
400.0000 mg | ORAL_TABLET | Freq: Every day | ORAL | 0 refills | Status: DC
  Filled 2024-09-28: qty 90, 90d supply, fill #0

## 2024-10-08 ENCOUNTER — Other Ambulatory Visit: Payer: Self-pay

## 2024-10-08 ENCOUNTER — Other Ambulatory Visit: Payer: Self-pay | Admitting: Medical Genetics

## 2024-10-08 DIAGNOSIS — Z006 Encounter for examination for normal comparison and control in clinical research program: Secondary | ICD-10-CM

## 2024-10-11 ENCOUNTER — Other Ambulatory Visit (HOSPITAL_COMMUNITY): Payer: Self-pay

## 2024-10-13 ENCOUNTER — Other Ambulatory Visit: Payer: Self-pay

## 2024-10-13 MED ORDER — AMOXICILLIN-POT CLAVULANATE 500-125 MG PO TABS
1.0000 | ORAL_TABLET | Freq: Two times a day (BID) | ORAL | 0 refills | Status: DC
  Filled 2024-10-13: qty 20, 10d supply, fill #0

## 2024-10-14 ENCOUNTER — Other Ambulatory Visit: Payer: Self-pay

## 2024-10-14 MED ORDER — LISDEXAMFETAMINE DIMESYLATE 40 MG PO CAPS
40.0000 mg | ORAL_CAPSULE | Freq: Every morning | ORAL | 0 refills | Status: AC
  Filled 2024-10-14: qty 30, 30d supply, fill #0

## 2024-10-15 ENCOUNTER — Other Ambulatory Visit: Payer: Self-pay

## 2024-10-19 ENCOUNTER — Other Ambulatory Visit (HOSPITAL_COMMUNITY): Payer: Self-pay

## 2024-10-19 ENCOUNTER — Encounter: Payer: Self-pay | Admitting: Pharmacist

## 2024-10-19 ENCOUNTER — Other Ambulatory Visit: Payer: Self-pay

## 2024-10-20 ENCOUNTER — Other Ambulatory Visit: Payer: Self-pay

## 2024-10-20 ENCOUNTER — Encounter: Payer: Self-pay | Admitting: Pharmacist

## 2024-10-20 ENCOUNTER — Other Ambulatory Visit (HOSPITAL_COMMUNITY): Payer: Self-pay

## 2024-10-21 ENCOUNTER — Other Ambulatory Visit: Payer: Self-pay

## 2024-10-21 ENCOUNTER — Encounter: Payer: Self-pay | Admitting: Internal Medicine

## 2024-10-21 ENCOUNTER — Ambulatory Visit: Admitting: Internal Medicine

## 2024-10-21 VITALS — BP 122/76 | HR 80 | Temp 98.5°F | Ht 67.0 in | Wt 236.6 lb

## 2024-10-21 DIAGNOSIS — R739 Hyperglycemia, unspecified: Secondary | ICD-10-CM | POA: Diagnosis not present

## 2024-10-21 DIAGNOSIS — E7800 Pure hypercholesterolemia, unspecified: Secondary | ICD-10-CM

## 2024-10-21 DIAGNOSIS — R232 Flushing: Secondary | ICD-10-CM | POA: Diagnosis not present

## 2024-10-21 DIAGNOSIS — F339 Major depressive disorder, recurrent, unspecified: Secondary | ICD-10-CM | POA: Diagnosis not present

## 2024-10-21 DIAGNOSIS — R7989 Other specified abnormal findings of blood chemistry: Secondary | ICD-10-CM

## 2024-10-21 DIAGNOSIS — I10 Essential (primary) hypertension: Secondary | ICD-10-CM

## 2024-10-21 DIAGNOSIS — F419 Anxiety disorder, unspecified: Secondary | ICD-10-CM | POA: Diagnosis not present

## 2024-10-21 DIAGNOSIS — D649 Anemia, unspecified: Secondary | ICD-10-CM | POA: Diagnosis not present

## 2024-10-21 DIAGNOSIS — E039 Hypothyroidism, unspecified: Secondary | ICD-10-CM

## 2024-10-21 DIAGNOSIS — Z124 Encounter for screening for malignant neoplasm of cervix: Secondary | ICD-10-CM

## 2024-10-21 MED ORDER — LEVOTHYROXINE SODIUM 100 MCG PO TABS
100.0000 ug | ORAL_TABLET | Freq: Every day | ORAL | 1 refills | Status: AC
  Filled 2024-10-21: qty 90, 90d supply, fill #0

## 2024-10-21 MED ORDER — MAGNESIUM OXIDE 400 MG PO TABS
400.0000 mg | ORAL_TABLET | Freq: Every day | ORAL | 0 refills | Status: AC
  Filled 2024-10-21: qty 90, 90d supply, fill #0

## 2024-10-21 MED ORDER — ROSUVASTATIN CALCIUM 10 MG PO TABS
10.0000 mg | ORAL_TABLET | Freq: Every day | ORAL | 3 refills | Status: AC
  Filled 2024-10-21: qty 90, 90d supply, fill #0

## 2024-10-21 NOTE — Progress Notes (Signed)
 "  Subjective:    Patient ID: [name] Nielsen, female    DOB: 1976-07-04, 49 y.o.   MRN: [id number]  Patient here for a scheduled follow up.   HPI Here for a scheduled follow up -  follow up regarding hypertension and hypercholesterolemia. Followed by psychiatry. Previously on wellbutrin  and changed to prozac. Did not tolerate. Was then changed to avelity. Tolerating. Overall doing ok on this medication. Last visit, concerned regarding possible menopausal symptoms. Did not tolerate gabapentin .  Saw gyn - Dr Rutherford. Started on estrogen patch and progesterone  nightly. Sleeping better. Hot flashes are better. Has f/u next week. Still with some brain fog. Breathing stable. Bowels stable.    Past Medical History:  Diagnosis Date   Anemia    hx of    Anxiety    COVID-19 virus infection 11/2019   Depression    Frequent headaches    hx of   Heart murmur    Hx: UTI (urinary tract infection)    child, s/p urinary procecure   Hypercholesterolemia    Hypertension    Hypothyroidism    Past Surgical History:  Procedure Laterality Date   CHOLECYSTECTOMY  2012   COLONOSCOPY     > 10 yrs ago- normal    LAPAROSCOPIC GASTRIC SLEEVE RESECTION N/A 07/05/2014   Procedure: LAPAROSCOPIC GASTRIC SLEEVE RESECTION with upper endoscopy;  Surgeon: Adina Lunger, MD;  Location: [facility];  Service: General;  Laterality: N/A;   TUBAL LIGATION  1999   WISDOM TOOTH EXTRACTION     age 51    Family History  Problem Relation Age of Onset   Cancer Mother        brain   Diabetes Father    Hypertension Father    Kidney disease Father    Stroke Father    Cancer Father        prostate   Prostate cancer Father    Breast cancer Neg Hx    Colon cancer Neg Hx    Colon polyps Neg Hx    Esophageal cancer Neg Hx    Rectal cancer Neg Hx    Stomach cancer Neg Hx    Social History   Socioeconomic History   Marital status: Single    Spouse name: Not on file   Number of children: Not on file   Years of education:  Not on file   Highest education level: Master's degree (e.g., MA, MS, MEng, MEd, MSW, MBA)  Occupational History   Not on file  Tobacco Use   Smoking status: Former   Smokeless tobacco: Never  Substance and Sexual Activity   Alcohol use: No    Alcohol/week: 0.0 standard drinks of alcohol   Drug use: No   Sexual activity: Not on file  Other Topics Concern   Not on file  Social History Narrative   Not on file   Social Drivers of Health   Tobacco Use: Medium Risk (10/24/2024)   Patient History    Smoking Tobacco Use: Former    Smokeless Tobacco Use: Never    Passive Exposure: Not on file  Financial Resource Strain: Low Risk (10/18/2024)   Overall Financial Resource Strain (CARDIA)    Difficulty of Paying Living Expenses: Not very hard  Food Insecurity: No Food Insecurity (10/18/2024)   Epic    Worried About Radiation Protection Practitioner of Food in the Last Year: Never true    Ran Out of Food in the Last Year: Never true  Transportation Needs: No Transportation Needs (10/18/2024)  Epic    Lack of Transportation (Medical): No    Lack of Transportation (Non-Medical): No  Physical Activity: Insufficiently Active (10/18/2024)   Exercise Vital Sign    Days of Exercise per Week: 1 day    Minutes of Exercise per Session: 30 min  Stress: No Stress Concern Present (10/18/2024)   [employer] of Occupational Health - Occupational Stress Questionnaire    Feeling of Stress: Not at all  Social Connections: Moderately Isolated (10/18/2024)   Social Connection and Isolation Panel    Frequency of Communication with Friends and Family: Twice a week    Frequency of Social Gatherings with Friends and Family: Once a week    Attends Religious Services: Never    Database Administrator or Organizations: Yes    Attends Banker Meetings: 1 to 4 times per year    Marital Status: Separated  Depression (PHQ2-9): Medium Risk (10/21/2024)   Depression (PHQ2-9)    PHQ-2 Score: 10  Alcohol Screen: Not on  file  Housing: High Risk (10/18/2024)   Epic    Unable to Pay for Housing in the Last Year: Yes    Number of Times Moved in the Last Year: 0    Homeless in the Last Year: No  Utilities: Not on file  Health Literacy: Not on file     Review of Systems  Constitutional:  Negative for appetite change and unexpected weight change.  HENT:  Negative for congestion and sinus pressure.   Respiratory:  Negative for cough, chest tightness and shortness of breath.   Cardiovascular:  Negative for chest pain, palpitations and leg swelling.  Gastrointestinal:  Negative for abdominal pain, diarrhea, nausea and vomiting.  Genitourinary:  Negative for difficulty urinating and dysuria.  Musculoskeletal:  Negative for joint swelling and myalgias.  Skin:  Negative for color change and rash.  Neurological:  Negative for dizziness and headaches.  Psychiatric/Behavioral:  Negative for agitation and dysphoric mood.        Objective:     BP 122/76   Pulse 80   Temp 98.5 F (36.9 C) (Oral)   Ht 5' 7 (1.702 m)   Wt 236 lb 9.6 oz (107.3 kg)   SpO2 99%   BMI 37.06 kg/m  Wt Readings from Last 3 Encounters:  10/21/24 236 lb 9.6 oz (107.3 kg)  07/01/24 225 lb (102.1 kg)  03/29/24 219 lb 12.8 oz (99.7 kg)    Physical Exam Vitals reviewed.  Constitutional:      General: She is not in acute distress.    Appearance: Normal appearance.  HENT:     Head: Normocephalic and atraumatic.     Right Ear: External ear normal.     Left Ear: External ear normal.     Mouth/Throat:     Pharynx: No oropharyngeal exudate or posterior oropharyngeal erythema.  Eyes:     General: No scleral icterus.       Right eye: No discharge.        Left eye: No discharge.     Conjunctiva/sclera: Conjunctivae normal.  Neck:     Thyroid : No thyromegaly.  Cardiovascular:     Rate and Rhythm: Normal rate and regular rhythm.  Pulmonary:     Effort: No respiratory distress.     Breath sounds: Normal breath sounds. No  wheezing.  Abdominal:     General: Bowel sounds are normal.     Palpations: Abdomen is soft.     Tenderness: There is no abdominal tenderness.  Musculoskeletal:        General: No swelling or tenderness.     Cervical back: Neck supple. No tenderness.  Lymphadenopathy:     Cervical: No cervical adenopathy.  Skin:    Findings: No erythema or rash.  Neurological:     Mental Status: She is alert.  Psychiatric:        Mood and Affect: Mood normal.        Behavior: Behavior normal.         Outpatient Encounter Medications as of 10/21/2024  Medication Sig   acetaminophen  (TYLENOL ) 500 MG tablet Take 1,000 mg by mouth every 6 (six) hours as needed for mild pain or moderate pain.   ALPRAZolam  (XANAX ) 0.25 MG tablet Take 1 tablet (0.25 mg total) by mouth daily as needed for anxiety.   AUVELITY  45-105 MG TBCR Take 1 tablet by mouth 2 (two) times daily.   cholecalciferol (VITAMIN D3) 25 MCG (1000 UNIT) tablet Take 2,000 Units by mouth daily.   Cyanocobalamin (B-12 PO) Take by mouth.   estradiol  (VIVELLE -DOT) 0.05 MG/24HR patch Place 1 patch (0.05 mg total) onto the skin 2 (two) times a week.   Fe Fum-FePoly-Vit C-Vit B3 (INTEGRA) 62.5-62.5-40-3 MG CAPS Take 1 capsule by mouth daily.   lisdexamfetamine  (VYVANSE ) 40 MG capsule Take 40 mg by mouth every morning.   lisdexamfetamine  (VYVANSE ) 40 MG capsule Take 1 capsule (40 mg total) by mouth in the morning.   lisdexamfetamine  (VYVANSE ) 40 MG capsule Take 1 capsule (40 mg total) by mouth in the morning.   lisdexamfetamine  (VYVANSE ) 40 MG capsule Take 1 capsule (40 mg total) by mouth every morning.   MAGNESIUM -OXIDE 400 (240 Mg) MG tablet Take 1 tablet by mouth once daily   progesterone  (PROMETRIUM ) 200 MG capsule Take 1 capsule (200 mg total) by mouth daily.   levothyroxine  (SYNTHROID ) 100 MCG tablet Take 1 tablet (100 mcg total) by mouth daily before breakfast.   magnesium  oxide (MAG-OX) 400 MG tablet Take 1 tablet (400 mg total) by mouth  daily.   rosuvastatin  (CRESTOR ) 10 MG tablet Take 1 tablet (10 mg total) by mouth daily.   [DISCONTINUED] amoxicillin -clavulanate (AUGMENTIN ) 500-125 MG tablet Take 1 tablet by mouth every 12 (twelve) hours for 10 days.   [DISCONTINUED] AUVELITY  45-105 MG TBCR Take 1 tablet by mouth 2 (two) times daily.   [DISCONTINUED] estradiol  (VIVELLE -DOT) 0.05 MG/24HR patch Apply 1 Transdermal biweekly   [DISCONTINUED] gabapentin  (NEURONTIN ) 100 MG capsule Take 1 capsule (100 mg total) by mouth at bedtime. (Patient not taking: Reported on 10/21/2024)   [DISCONTINUED] levothyroxine  (SYNTHROID ) 100 MCG tablet Take 1 tablet (100 mcg total) by mouth daily before breakfast.   [DISCONTINUED] lisdexamfetamine  (VYVANSE ) 40 MG capsule Take 1 capsule (40 mg total) by mouth in the morning.   [DISCONTINUED] magnesium  oxide (MAG-OX) 400 MG tablet Take 1 tablet (400 mg total) by mouth daily.   [DISCONTINUED] predniSONE  (STERAPRED UNI-PAK 21 TAB) 10 MG (21) TBPK tablet Take as directed.   [DISCONTINUED] rosuvastatin  (CRESTOR ) 10 MG tablet Take 1 tablet (10 mg total) by mouth daily.   [DISCONTINUED] tirzepatide  (ZEPBOUND ) 10 MG/0.5ML Pen Inject 10 mg into the skin once a week.   No facility-administered encounter medications on file as of 10/21/2024.     Lab Results  Component Value Date   WBC 5.3 01/06/2024   HGB 12.8 01/06/2024   HCT 39.9 01/06/2024   PLT 256.0 01/06/2024   GLUCOSE 106 (H) 07/22/2024   CHOL 206 (H) 07/22/2024   TRIG 140  07/22/2024   HDL 88 07/22/2024   LDLDIRECT 178.3 11/11/2013   LDLCALC 94 07/22/2024   ALT 14 09/06/2024   AST 16 09/06/2024   NA 138 07/22/2024   K 4.1 07/22/2024   CL 103 07/22/2024   CREATININE 0.87 07/22/2024   BUN 10 07/22/2024   CO2 22 07/22/2024   TSH 3.17 06/29/2024   HGBA1C 5.6 07/22/2024    MM 3D SCREENING MAMMOGRAM BILATERAL BREAST Result Date: 07/28/2024 CLINICAL DATA:  Screening. EXAM: DIGITAL SCREENING BILATERAL MAMMOGRAM WITH TOMOSYNTHESIS AND CAD  TECHNIQUE: Bilateral screening digital craniocaudal and mediolateral oblique mammograms were obtained. Bilateral screening digital breast tomosynthesis was performed. The images were evaluated with computer-aided detection. COMPARISON:  Previous exam(s). ACR Breast Density Category b: There are scattered areas of fibroglandular density. FINDINGS: There are no findings suspicious for malignancy. IMPRESSION: No mammographic evidence of malignancy. A result letter of this screening mammogram will be mailed directly to the patient. RECOMMENDATION: Screening mammogram in one year. (Code:SM-B-01Y) BI-RADS CATEGORY  1: Negative. Electronically Signed   By: Dina  Arceo M.D.   On: 07/28/2024 20:26       Assessment & Plan:  Hypothyroidism, unspecified type Assessment & Plan: On thyroid  replacement. Follow tsh.    Hypercholesterolemia Assessment & Plan: The 10-year ASCVD risk score (Arnett DK, et al., 2019) is: 0.7%   Values used to calculate the score:     Age: 72 years     Clinically relevant sex: Female     Is Non-Hispanic African American: No     Diabetic: No     Tobacco smoker: No     Systolic Blood Pressure: 122 mmHg     Is BP treated: No     HDL Cholesterol: 88 mg/dL     Total Cholesterol: 206 mg/dL  Low cholesterol diet and exercise.   Orders: -     Rosuvastatin  Calcium ; Take 1 tablet (10 mg total) by mouth daily.  Dispense: 90 tablet; Refill: 3 -     Hepatic function panel; Future -     Basic metabolic panel with GFR; Future -     Lipid panel; Future  Hyperglycemia Assessment & Plan: Low-carb diet and exercise.  Follow met b and A1c.   Orders: -     Hemoglobin A1c; Future  Anemia, unspecified type -     CBC with Differential/Platelet; Future  Hot flashes Assessment & Plan: Saw gyn - Dr Rutherford. Started on estrogen patch and progesterone  nightly. Sleeping better. Hot flashes are better. Has f/u next week.   Essential hypertension, benign Assessment & Plan: Blood pressure  doing well off medication. Follow pressures. Remain off medication.    Depression, recurrent Assessment & Plan: Followed by psychiatry. Previously on wellbutrin . Changed to prozac. Did not tolerate. Changed to avelity. Continue f/u with psychiatry. Stable.    Cervical cancer screening Assessment & Plan: PAP smear 01/08/23 - negative with negative HPV.    Anxiety Assessment & Plan: Followed by psychiatry. Previously on wellbutrin . Changed to prozac. Did not tolerate. Changed to avelity. Discussed. Stable on this medication. Continue f/u with psychiatry.    Abnormal liver function test Assessment & Plan: Liver function tests 09/06/24 - wnl.    Other orders -     Levothyroxine  Sodium; Take 1 tablet (100 mcg total) by mouth daily before breakfast.  Dispense: 90 tablet; Refill: 1 -     Magnesium  Oxide; Take 1 tablet (400 mg total) by mouth daily.  Dispense: 90 tablet; Refill: 0     Allena Hamilton,  MD "

## 2024-10-24 ENCOUNTER — Encounter: Payer: Self-pay | Admitting: Internal Medicine

## 2024-10-24 NOTE — Assessment & Plan Note (Signed)
 Followed by psychiatry. Previously on wellbutrin . Changed to prozac. Did not tolerate. Changed to avelity. Continue f/u with psychiatry. Stable.

## 2024-10-24 NOTE — Assessment & Plan Note (Signed)
 On thyroid replacement.  Follow tsh.

## 2024-10-24 NOTE — Assessment & Plan Note (Signed)
 Blood pressure doing well off medication. Follow pressures. Remain off medication.

## 2024-10-24 NOTE — Assessment & Plan Note (Signed)
 Saw gyn - Dr Rutherford. Started on estrogen patch and progesterone  nightly. Sleeping better. Hot flashes are better. Has f/u next week.

## 2024-10-24 NOTE — Assessment & Plan Note (Signed)
 PAP smear 01/08/23 - negative with negative HPV.

## 2024-10-24 NOTE — Assessment & Plan Note (Signed)
 Low-carb diet and exercise.  Follow met b and A1c.

## 2024-10-24 NOTE — Assessment & Plan Note (Signed)
 The 10-year ASCVD risk score (Arnett DK, et al., 2019) is: 0.7%   Values used to calculate the score:     Age: 49 years     Clinically relevant sex: Female     Is Non-Hispanic African American: No     Diabetic: No     Tobacco smoker: No     Systolic Blood Pressure: 122 mmHg     Is BP treated: No     HDL Cholesterol: 88 mg/dL     Total Cholesterol: 206 mg/dL  Low cholesterol diet and exercise.

## 2024-10-24 NOTE — Assessment & Plan Note (Signed)
 Liver function tests 09/06/24 - wnl.

## 2024-10-24 NOTE — Assessment & Plan Note (Signed)
 Followed by psychiatry. Previously on wellbutrin . Changed to prozac. Did not tolerate. Changed to avelity. Discussed. Stable on this medication. Continue f/u with psychiatry.

## 2024-10-25 MED ORDER — WEGOVY 1.5 MG PO TABS: 1.5000 mg | ORAL_TABLET | Freq: Every day | ORAL | 1 refills | Status: AC

## 2024-10-25 NOTE — Telephone Encounter (Signed)
 Medication pended. Awaiting response to verify pharmacy

## 2024-10-25 NOTE — Telephone Encounter (Signed)
 Rx sent in for oral wegovy  1.5mg . pt notified via my chart.

## 2024-11-08 ENCOUNTER — Other Ambulatory Visit: Payer: Self-pay

## 2024-11-08 ENCOUNTER — Other Ambulatory Visit (HOSPITAL_COMMUNITY): Payer: Self-pay

## 2024-11-08 MED ORDER — LISDEXAMFETAMINE DIMESYLATE 40 MG PO CAPS: 40.0000 mg | ORAL_CAPSULE | Freq: Every morning | ORAL | 0 refills | Status: AC

## 2024-11-10 ENCOUNTER — Other Ambulatory Visit (HOSPITAL_COMMUNITY): Payer: Self-pay

## 2025-01-20 ENCOUNTER — Ambulatory Visit: Admitting: Internal Medicine

## 2025-09-14 ENCOUNTER — Ambulatory Visit
